# Patient Record
Sex: Female | Born: 1965 | Race: White | Hispanic: Refuse to answer | Marital: Married | Smoking: Never smoker
Health system: Southern US, Community
[De-identification: ages and names within clinical notes are randomized; demographics above are authoritative.]

## PROBLEM LIST (undated history)

## (undated) DIAGNOSIS — F419 Anxiety disorder, unspecified: Secondary | ICD-10-CM

## (undated) DIAGNOSIS — R9431 Abnormal electrocardiogram [ECG] [EKG]: Secondary | ICD-10-CM

## (undated) DIAGNOSIS — R053 Chronic cough: Secondary | ICD-10-CM

## (undated) DIAGNOSIS — L719 Rosacea, unspecified: Secondary | ICD-10-CM

## (undated) DIAGNOSIS — R112 Nausea with vomiting, unspecified: Secondary | ICD-10-CM

## (undated) DIAGNOSIS — S060XAA Concussion with loss of consciousness status unknown, initial encounter: Secondary | ICD-10-CM

## (undated) DIAGNOSIS — Z87442 Personal history of urinary calculi: Secondary | ICD-10-CM

## (undated) DIAGNOSIS — R519 Headache, unspecified: Secondary | ICD-10-CM

## (undated) DIAGNOSIS — S060X9A Concussion with loss of consciousness of unspecified duration, initial encounter: Secondary | ICD-10-CM

## (undated) DIAGNOSIS — G8929 Other chronic pain: Secondary | ICD-10-CM

## (undated) DIAGNOSIS — R51 Headache: Secondary | ICD-10-CM

## (undated) DIAGNOSIS — J45990 Exercise induced bronchospasm: Secondary | ICD-10-CM

## (undated) DIAGNOSIS — R911 Solitary pulmonary nodule: Secondary | ICD-10-CM

## (undated) DIAGNOSIS — Z8669 Personal history of other diseases of the nervous system and sense organs: Secondary | ICD-10-CM

## (undated) DIAGNOSIS — S0990XA Unspecified injury of head, initial encounter: Secondary | ICD-10-CM

## (undated) DIAGNOSIS — R0600 Dyspnea, unspecified: Secondary | ICD-10-CM

## (undated) DIAGNOSIS — R9389 Abnormal findings on diagnostic imaging of other specified body structures: Secondary | ICD-10-CM

## (undated) DIAGNOSIS — Z9889 Other specified postprocedural states: Secondary | ICD-10-CM

## (undated) DIAGNOSIS — E039 Hypothyroidism, unspecified: Secondary | ICD-10-CM

## (undated) DIAGNOSIS — R05 Cough: Secondary | ICD-10-CM

## (undated) HISTORY — DX: Headache: R51

## (undated) HISTORY — DX: Solitary pulmonary nodule: R91.1

## (undated) HISTORY — DX: Nausea with vomiting, unspecified: R11.2

## (undated) HISTORY — DX: Personal history of other diseases of the nervous system and sense organs: Z86.69

## (undated) HISTORY — DX: Abnormal electrocardiogram (ECG) (EKG): R94.31

## (undated) HISTORY — DX: Concussion with loss of consciousness of unspecified duration, initial encounter: S06.0X9A

## (undated) HISTORY — DX: Headache, unspecified: R51.9

## (undated) HISTORY — DX: Unspecified injury of head, initial encounter: S09.90XA

## (undated) HISTORY — DX: Personal history of urinary calculi: Z87.442

## (undated) HISTORY — DX: Concussion with loss of consciousness status unknown, initial encounter: S06.0XAA

## (undated) HISTORY — DX: Other specified postprocedural states: Z98.890

## (undated) HISTORY — DX: Chronic cough: R05.3

## (undated) HISTORY — DX: Other chronic pain: G89.29

## (undated) HISTORY — DX: Anxiety disorder, unspecified: F41.9

## (undated) HISTORY — PX: ELBOW SURGERY: SHX618

## (undated) HISTORY — DX: Dyspnea, unspecified: R06.00

## (undated) HISTORY — DX: Abnormal findings on diagnostic imaging of other specified body structures: R93.89

## (undated) HISTORY — PX: EYE SURGERY: SHX253

## (undated) HISTORY — DX: Rosacea, unspecified: L71.9

## (undated) HISTORY — DX: Cough: R05

## (undated) HISTORY — PX: SHOULDER SURGERY: SHX246

## (undated) HISTORY — DX: Exercise induced bronchospasm: J45.990

---

## 1989-11-11 HISTORY — PX: FOOT SURGERY: SHX648

## 1999-09-11 ENCOUNTER — Other Ambulatory Visit: Admission: RE | Admit: 1999-09-11 | Discharge: 1999-09-11 | Payer: Self-pay | Admitting: Obstetrics and Gynecology

## 2000-09-29 ENCOUNTER — Other Ambulatory Visit: Admission: RE | Admit: 2000-09-29 | Discharge: 2000-09-29 | Payer: Self-pay | Admitting: Obstetrics and Gynecology

## 2001-11-24 ENCOUNTER — Other Ambulatory Visit: Admission: RE | Admit: 2001-11-24 | Discharge: 2001-11-24 | Payer: Self-pay | Admitting: Obstetrics and Gynecology

## 2002-01-18 ENCOUNTER — Encounter: Payer: Self-pay | Admitting: Obstetrics and Gynecology

## 2002-01-18 ENCOUNTER — Encounter: Admission: RE | Admit: 2002-01-18 | Discharge: 2002-01-18 | Payer: Self-pay | Admitting: Obstetrics and Gynecology

## 2002-12-20 ENCOUNTER — Other Ambulatory Visit: Admission: RE | Admit: 2002-12-20 | Discharge: 2002-12-20 | Payer: Self-pay | Admitting: Obstetrics and Gynecology

## 2004-02-13 ENCOUNTER — Other Ambulatory Visit: Admission: RE | Admit: 2004-02-13 | Discharge: 2004-02-13 | Payer: Self-pay | Admitting: Obstetrics and Gynecology

## 2005-03-01 ENCOUNTER — Other Ambulatory Visit: Admission: RE | Admit: 2005-03-01 | Discharge: 2005-03-01 | Payer: Self-pay | Admitting: Obstetrics and Gynecology

## 2009-03-21 ENCOUNTER — Encounter (INDEPENDENT_AMBULATORY_CARE_PROVIDER_SITE_OTHER): Payer: Self-pay | Admitting: Internal Medicine

## 2009-03-21 ENCOUNTER — Ambulatory Visit: Payer: Self-pay

## 2009-03-24 ENCOUNTER — Encounter: Payer: Self-pay | Admitting: Physician Assistant

## 2009-03-24 ENCOUNTER — Ambulatory Visit: Payer: Self-pay | Admitting: Cardiology

## 2009-03-28 ENCOUNTER — Ambulatory Visit: Payer: Self-pay

## 2009-03-28 ENCOUNTER — Encounter: Payer: Self-pay | Admitting: Cardiology

## 2009-03-30 ENCOUNTER — Telehealth: Payer: Self-pay | Admitting: Cardiology

## 2009-05-10 DIAGNOSIS — R0600 Dyspnea, unspecified: Secondary | ICD-10-CM | POA: Insufficient documentation

## 2009-05-10 DIAGNOSIS — R9431 Abnormal electrocardiogram [ECG] [EKG]: Secondary | ICD-10-CM

## 2009-05-10 DIAGNOSIS — R0602 Shortness of breath: Secondary | ICD-10-CM | POA: Insufficient documentation

## 2009-05-10 HISTORY — DX: Abnormal electrocardiogram (ECG) (EKG): R94.31

## 2009-06-02 ENCOUNTER — Ambulatory Visit: Payer: Self-pay | Admitting: Genetic Counselor

## 2009-07-26 ENCOUNTER — Ambulatory Visit: Payer: Self-pay | Admitting: Genetic Counselor

## 2009-11-11 DIAGNOSIS — R911 Solitary pulmonary nodule: Secondary | ICD-10-CM

## 2009-11-11 HISTORY — DX: Solitary pulmonary nodule: R91.1

## 2009-12-14 ENCOUNTER — Emergency Department (HOSPITAL_COMMUNITY): Admission: EM | Admit: 2009-12-14 | Discharge: 2009-12-14 | Payer: Self-pay | Admitting: Emergency Medicine

## 2009-12-18 ENCOUNTER — Encounter: Payer: Self-pay | Admitting: Internal Medicine

## 2009-12-18 ENCOUNTER — Telehealth: Payer: Self-pay | Admitting: Internal Medicine

## 2009-12-25 ENCOUNTER — Ambulatory Visit: Payer: Self-pay | Admitting: Internal Medicine

## 2009-12-25 DIAGNOSIS — R1013 Epigastric pain: Secondary | ICD-10-CM | POA: Insufficient documentation

## 2009-12-26 ENCOUNTER — Ambulatory Visit: Payer: Self-pay | Admitting: Internal Medicine

## 2009-12-28 ENCOUNTER — Telehealth: Payer: Self-pay | Admitting: Internal Medicine

## 2009-12-28 ENCOUNTER — Ambulatory Visit (HOSPITAL_COMMUNITY): Admission: RE | Admit: 2009-12-28 | Discharge: 2009-12-28 | Payer: Self-pay | Admitting: Internal Medicine

## 2010-03-12 ENCOUNTER — Encounter: Payer: Self-pay | Admitting: Internal Medicine

## 2010-06-14 ENCOUNTER — Encounter: Admission: RE | Admit: 2010-06-14 | Discharge: 2010-06-14 | Payer: Self-pay | Admitting: Internal Medicine

## 2010-06-14 ENCOUNTER — Encounter: Payer: Self-pay | Admitting: Internal Medicine

## 2010-06-27 ENCOUNTER — Ambulatory Visit: Payer: Self-pay | Admitting: Internal Medicine

## 2010-06-27 DIAGNOSIS — J984 Other disorders of lung: Secondary | ICD-10-CM | POA: Insufficient documentation

## 2010-06-27 DIAGNOSIS — R918 Other nonspecific abnormal finding of lung field: Secondary | ICD-10-CM | POA: Insufficient documentation

## 2010-06-27 LAB — CONVERTED CEMR LAB: Sed Rate: 10 mm/hr (ref 0–22)

## 2010-12-11 NOTE — Progress Notes (Signed)
Summary: triage  Phone Note From Other Clinic Call back at [phone]   Caller: Lelon Mast Call For: Dr. Marina Goodell Reason for Call: Schedule Patient Appt Summary of Call: sch'ed next available with Dr. Marina Goodell, specific request, but would like sooner appt... pt has abd and esophagial pain that is making it difficult for pt to breathe and walk Initial call taken by: Vallarie Mare,  December 18, 2009 2:09 PM  Follow-up for Phone Call        Samantha ntfd. that pt's appt. has been moved up to 12/25/2009.She will contact pt.New pt. info mailed to pt. Follow-up by: Teryl Lucy RN,  December 18, 2009 3:41 PM

## 2010-12-11 NOTE — Assessment & Plan Note (Signed)
Summary: abd and esophagial pain...as.   History of Present Illness Visit Type: we'll one and a Primary GI MD: Yancey Flemings MD Primary Provider: Marisue Brooklyn, DO Requesting Provider: Marisue Brooklyn, DO Chief Complaint: Patient complains of upper abdominal pain that started on 2-1. The pain resolved some since she was started on cipro, she took her last dose this morning. She is still on a limited diet at this point.  History of Present Illness:   45 year old female with chronic migraine headaches. She presents today for evaluation of new onset abdominal pain. She is accompanied by her husband. Patient reports being in her usual state of good health until December 12, 2009 when she began to notice epigastric discomfort below the breastbone. The discomfort waxed and waned over the next 2 days. She self medicated with Nexium. She did notice that meals seemed to help somewhat. The pain intensified she was evaluated in her physician's office on February 3. The patient had a migraine headache and was treated with Toradol. There was a concern over an abdominal aortic aneurysm for which she was sent to the emergency room. Evaluation in the emergency room consisted of multiple laboratories. CBC revealed a mildly elevated white blood cell count of 12.7 with a left shift. Urinalysis was negative. Normal liver function tests and serum lipase. Acute abdominal series with chest film revealed slightly increased markings in the right middle lobe and question pectus deformity. CT Scan of the abdomen and pelvis with contrast revealed no acute process. Pectus deformity noted. Slight prominence of the pancreatic head without mass commented on (reviewed). She was treated with Nexium and Pepcid. She followed up in her physician's office and was treated with Cipro and modified diet. She has just completed one 7 day course of Cipro. Her symptoms are gradually improving though not gone. There was no associated nausea, vomiting,  diarrhea, fevers, or radiation into the back. There was discomfort under the rib cage bilaterally. She rarely uses NSAIDs. She does have a history of kidney stones. She asked about advancing her diet.   GI Review of Systems    Reports abdominal pain, belching, loss of appetite, and  weight loss.     Location of  Abdominal pain: upper abdomen. Weight loss of 10 pounds over 13 days.   Denies acid reflux, bloating, chest pain, dysphagia with liquids, dysphagia with solids, heartburn, nausea, vomiting, vomiting blood, and  weight gain.        Denies anal fissure, black tarry stools, change in bowel habit, constipation, diarrhea, diverticulosis, fecal incontinence, heme positive stool, hemorrhoids, irritable bowel syndrome, jaundice, light color stool, liver problems, rectal bleeding, and  rectal pain.    Current Medications (verified): 1)  Pepcid 40 Mg Tabs (Famotidine) .... Take One By Mouth Once Daily 2)  Nexium 40 Mg Cpdr (Esomeprazole Magnesium) .Marland Kitchen.. 1 By Mouth Once Daily 3)  Imitrex 100 Mg Tabs (Sumatriptan Succinate) .... Take As Needed 4)  Imitrex 20 Mg/act Soln (Sumatriptan) .... As Needed 5)  Zyrtec Allergy 10 Mg Tabs (Cetirizine Hcl) .... Take One By Mouth Once Daily 6)  Aspirin 81 Mg Tbec (Aspirin) .... Take One By Mouth Once Daily 7)  Vitamin D 1000 Unit Tabs (Cholecalciferol) .... Take Three Tabs By Mouth Once Daily 8)  Calcium Carbonate-Vitamin D 600-200 Mg-Unit Tabs (Calcium Carbonate-Vitamin D) .... Take One By Mouth Once Daily 9)  Flax Seed Oil 1000 Mg Caps (Flaxseed (Linseed)) .... Take One By Mouth Once Daily 10)  Co-Enzyme Q-10 100 Mg Caps (Coenzyme Q10) .Marland KitchenMarland KitchenMarland Kitchen  Take One By Mouth Once Daily 11)  Low-Ogestrel 0.3-30 Mg-Mcg Tabs (Norgestrel-Ethinyl Estradiol) .... Take One By Mouth Once Daily 12)  Valtrex 500 Mg Tabs (Valacyclovir Hcl) .... As Needed 13)  Vesicare 5 Mg Tabs (Solifenacin Succinate) .... As Needed  Allergies (verified): 1)  ! Codeine  Past History:  Past  Medical History: Reviewed history from 12/22/2009 and no changes required. DYSPNEA (ICD-786.05) ABNORMAL EKG (ICD-794.31)   Chronic Headaches  Past Surgical History: Shoulder bilateral acromium bone spur  Foot left  Family History: Family History of Breast Cancer: maternal grandmother, maternal great grandmother Family History of Ovarian Cancer:paternal great grandmother, maternal great aunt Family History of Colon Polyps: mother, father Family History of Colitis: father Family History of Diabetes: Paternal grandfather, mother Family History of Heart Disease: father  Social History: Full Time - city of [place] Married  Tobacco Use - No.  Regular Exercise - yes Drug Use - no Daily Caffeine Use 2 per day  Review of Systems       The patient complains of allergy/sinus and headaches-new.  The patient denies anemia, anxiety-new, arthritis/joint pain, back pain, blood in urine, breast changes/lumps, change in vision, confusion, cough, coughing up blood, depression-new, fainting, fatigue, fever, hearing problems, heart murmur, heart rhythm changes, itching, menstrual pain, muscle pains/cramps, night sweats, nosebleeds, pregnancy symptoms, shortness of breath, skin rash, sleeping problems, sore throat, swelling of feet/legs, swollen lymph glands, thirst - excessive , urination - excessive , urination changes/pain, urine leakage, vision changes, and voice change.    Vital Signs:  Patient profile:   45 year old female Height:      69 inches Weight:      161.6 pounds BMI:     23.95 Pulse rate:   64 / minute Pulse rhythm:   regular BP sitting:   112 / 66  (left arm) Cuff size:   regular  Vitals Entered By: Harlow Mares CMA Duncan Dull) (December 25, 2009 4:00 PM)  Physical Exam  General:  Well developed, well nourished, no acute distress. Head:  Normocephalic and atraumatic. Eyes:  PERRLA, no icterus. Ears:  Normal auditory acuity. Nose:  No deformity, discharge,  or  lesions. Mouth:  No deformity or lesions, dentition normal. Neck:  Supple; no masses or thyromegaly. Lungs:  Clear throughout to auscultation. Heart:  Regular rate and rhythm; no murmurs, rubs,  or bruits. Abdomen:  Soft,  and nondistended. Mild epigastric discomfort to palpation. No masses, hepatosplenomegaly or hernias noted. Normal bowel sounds. Msk:  Symmetrical with no gross deformities. Normal posture. Pulses:  Normal pulses noted. Extremities:  No clubbing, cyanosis, edema or deformities noted. Neurologic:  Alert and  oriented x4;  grossly normal neurologically. Skin:  Intact without significant lesions or rashes. Psych:  Alert and cooperative. Normal mood and affect.   Impression & Recommendations:  Problem # 1:  EPIGASTRIC PAIN (ICD-789.06) Acute epigastric pain, onset 2 weeks ago. Gradually improving. Associated with mild leukocytosis. Etiology unclear. Possible causes include duodenal ulcer, biliary colic, or nonspecific infectious process.  Plan: #1. Abdominal ultrasound rule out gallstones #2. Upper endoscopy rule out ulcer disease #3. Continue Nexium but discontinue Pepcid  Problem # 2:  NONSPECIFIC ABN FINDING RAD & OTH EXAM GI TRACT (ICD-793.4) questionable prominence of the pancreatic head without mass. Nonspecific. Normal pancreatic and liver enzymes. Do not relate this to acute pain process. Would not pursue further at this time.  Other Orders: EGD (EGD) Ultrasound Abdomen (UAS)  Patient Instructions: 1)  Abdominal Ultrasound [facility] 12/28/09 9:00 am 2)  Nothing to eat or drink 6 hours prior  3)  EGD [facility] 12/26/09 9:30 am 4)  Upper Endoscopy brochure given.  5)  The medication list was reviewed and reconciled.  All changed / newly prescribed medications were explained.  A complete medication list was provided to the patient / caregiver. 6)  Copy: Dr. Marisue Brooklyn D.O.

## 2010-12-11 NOTE — Procedures (Signed)
Summary: Upper Endoscopy  Patient: [name] Note: All result statuses are Final unless otherwise noted.  Tests: (1) Upper Endoscopy (EGD)   EGD Upper Endoscopy       DONE     [facility]     [address]     [place], [place]  [address]           ENDOSCOPY PROCEDURE REPORT           PATIENT:  Nicole Waller, Nicole Waller  MR#:  [id number]     BIRTHDATE:  1966/02/25, 43 yrs. old  GENDER:  female           ENDOSCOPIST:  Wilhemina Bonito. Eda Keys, MD     Referred by:  Marisue Brooklyn, D.O.           PROCEDURE DATE:  12/26/2009     PROCEDURE:  EGD, diagnostic     ASA CLASS:  Class II     INDICATIONS:  epigastric pain           MEDICATIONS:   Fentanyl 100 mcg IV, Versed 8 mg IV     TOPICAL ANESTHETIC:  Exactacain Spray           DESCRIPTION OF PROCEDURE:   After the risks benefits and     alternatives of the procedure were thoroughly explained, informed     consent was obtained.  The [facility] [id number] endoscope was     introduced through the mouth and advanced to the second portion of     the duodenum, without limitations.  The instrument was slowly     withdrawn as the mucosa was fully examined.     <<PROCEDUREIMAGES>>           The upper, middle, and distal third of the esophagus were     carefully inspected and no abnormalities were noted. The z-line     was well seen at the GEJ. The endoscope was pushed into the fundus     which was normal including a retroflexed view. The antrum,gastric     body, first and second part of the duodenum were unremarkable.     Retroflexed views revealed no abnormalities.    The scope was then     withdrawn from the patient and the procedure completed.           COMPLICATIONS:  None           ENDOSCOPIC IMPRESSION:     1) Normal EGD           RECOMMENDATIONS:     1) Keep Abdominal Ultrasound appointment           ______________________________     Wilhemina Bonito. Eda Keys, MD           CC:  Marisue Brooklyn, DO, The Patient           n.     eSIGNED:    Wilhemina Bonito. Eda Keys at 12/26/2009 11:00 AM           Scarlette Ar, [id number]  Note: An exclamation mark (!) indicates a result that was not dispersed into the flowsheet. Document Creation Date: 12/26/2009 11:01 AM _______________________________________________________________________  (1) Order result status: Final Collection or observation date-time: 12/26/2009 10:54 Requested date-time:  Receipt date-time:  Reported date-time:  Referring Physician:   Ordering Physician: Fransico Setters [phone]) Specimen Source:  Source: Launa Grill Order Number: [id number] Lab site:

## 2010-12-11 NOTE — Letter (Signed)
Summary: EGD Instructions  [facility]  [address] [place], [place] [address]   Phone: [phone]  Fax: [phone]       Nicole Waller    September 22, 1966    MRN: [id number]       Procedure Day /Date:TUESDAY, 12/26/09     Arrival Time: 8:30 AM     Procedure Time:9:30 AM     Location of Procedure:                    [facility] (4th Floor)    PREPARATION FOR ENDOSCOPY   On TUESDAYTHE DAY OF THE PROCEDURE:  1.   No solid foods, milk or milk products are allowed after midnight the night before your procedure.  2.   Do not drink anything colored red or purple.  Avoid juices with pulp.  No orange juice.  3.  You may drink clear liquids until7:30 AM which is 2 hours before your procedure.                                                                                                CLEAR LIQUIDS INCLUDE: Water Jello Ice Popsicles Tea (sugar ok, no milk/cream) Powdered fruit flavored drinks Coffee (sugar ok, no milk/cream) Gatorade Juice: apple, white grape, white cranberry  Lemonade Clear bullion, consomm, broth Carbonated beverages (any kind) Strained chicken noodle soup Hard Candy   MEDICATION INSTRUCTIONS  Unless otherwise instructed, you should take regular prescription medications with a small sip of water as early as possible the morning of your procedure.               OTHER INSTRUCTIONS  You will need a responsible adult at least 45 years of age to accompany you and drive you home.   This person must remain in the waiting room during your procedure.  Wear loose fitting clothing that is easily removed.  Leave jewelry and other valuables at home.  However, you may wish to bring a book to read or an iPod/MP3 player to listen to music as you wait for your procedure to start.  Remove all body piercing jewelry and leave at home.  Total time from sign-in until discharge is approximately 2-3 hours.  You should go home  directly after your procedure and rest.  You can resume normal activities the day after your procedure.  The day of your procedure you should not:   Drive   Make legal decisions   Operate machinery   Drink alcohol   Return to work  You will receive specific instructions about eating, activities and medications before you leave.    The above instructions have been reviewed and explained to me by   _______________________    I fully understand and can verbalize these instructions _____________________________ Date _________

## 2010-12-11 NOTE — Progress Notes (Signed)
Summary: [place] results  Phone Note Call from Patient Call back at [phone]   Caller: Patient Call For: Dr. Marina Goodell Reason for Call: Lab or Test Results Summary of Call: pt would like [place] results Initial call taken by: Vallarie Mare,  December 28, 2009 3:44 PM  Follow-up for Phone Call        Pt notified.  See append to [place]. Follow-up by: Ashok Cordia RN,  December 28, 2009 4:47 PM

## 2010-12-11 NOTE — Letter (Signed)
Summary: Appt Reminder 2  [facility]  [address] [place], [place] [address]   Phone: [phone]  Fax: [phone]        December 18, 2009 MRN: [id number]    [facility] [address] [place], [place]  [address]    Dear Ms. Danielsen,   You have a new patient appointment with Dr. Yancey Flemings on Monday-February 14,2011 at 3:45 p.m  Please remember to bring a complete list of the medicines you are taking, your insurance card and your co-pay.  If you have to cancel or reschedule this appointment, please call before 5:00 pm the evening before to avoid a cancellation fee.  If you have any questions or concerns, please call [phone].    Sincerely,    Teryl Lucy RN

## 2010-12-11 NOTE — Assessment & Plan Note (Signed)
Summary: Pulmonary consultation/ SPN   Visit Type:  Initial Consult Copy to:  Marisue Brooklyn, DO Primary Provider/Referring Provider:  Marisue Brooklyn, DO  CC:  Pulmonary consult.  Abnormal CT chest..  History of Present Illness: 45 yowf never smoker grew up in house of smoking last heavy exp to passive smoke in 1980s  June 27, 2010  1st pulmonary office eval p ct abd 12/14/09 for nonpleuritic cp/abd pain with spn then full ct chest 06/14/10 with MPNs but no more cp,  chronicically with  some aches and pains but no overt rheumatism or travel exp.  Pt denies any significant sore throat, dysphagia, itching, sneezing,  nasal congestion or excess secretions,  fever, chills, sweats, unintended wt loss, pleuritic or exertional cp, hempoptysis, change in activity tolerance  orthopnea pnd or leg swelling    Preventive Screening-Counseling & Management  Alcohol-Tobacco     Alcohol drinks/day: <1     Smoking Status: never  Current Medications (verified): 1)  Imitrex 100 Mg Tabs (Sumatriptan Succinate) .... Take As Needed 2)  Imitrex 20 Mg/act Soln (Sumatriptan) .... As Needed 3)  Zyrtec Allergy 10 Mg Tabs (Cetirizine Hcl) .... Take One By Mouth Once Daily 4)  Aspirin 81 Mg Tbec (Aspirin) .... Take One By Mouth Once Daily 5)  Vitamin D 1000 Unit Tabs (Cholecalciferol) .... Take Three Tabs By Mouth Once Daily 6)  Calcium Carbonate-Vitamin D 600-200 Mg-Unit Tabs (Calcium Carbonate-Vitamin D) .... Take One By Mouth Once Daily 7)  Flax Seed Oil 1000 Mg Caps (Flaxseed (Linseed)) .... Take One By Mouth Once Daily 8)  Co-Enzyme Q-10 100 Mg Caps (Coenzyme Q10) .... Take One By Mouth Once Daily 9)  Low-Ogestrel 0.3-30 Mg-Mcg Tabs (Norgestrel-Ethinyl Estradiol) .... Take One By Mouth Once Daily 10)  Valtrex 500 Mg Tabs (Valacyclovir Hcl) .... As Needed 11)  Vesicare 5 Mg Tabs (Solifenacin Succinate) .... As Needed 12)  Ambien 10 Mg Tabs (Zolpidem Tartrate) .Marland Kitchen.. 1 By Mouth At Bedtime As Needed 13)   Multivitamins  Tabs (Multiple Vitamin) .Marland Kitchen.. 1 By Mouth Daily 14)  Xanax 0.5 Mg Tabs (Alprazolam) .... 1/4 By Mouth As Needed  Allergies (verified): 1)  ! Codeine  Past History:  Past Medical History: DYSPNEA (ICD-786.05) ABNORMAL EKG (ICD-794.31) ABN CHEST CT     - orignially detected 12/14/09     - F/u CT chest 06/14/10 SPN bilaterally one 11 mm LLL but not change since original ct. ABD Pain      - EGD neg 12/26/09      - Abd u/s neg 12/28/09   Chronic Headaches  Family History: Family History of Breast Cancer: maternal grandmother, maternal great grandmother Family History of Ovarian Cancer:paternal great grandmother, maternal great aunt Family History of Colon Polyps: mother, father Family History of Colitis: father Family History of Diabetes: Paternal grandfather, mother Family History of Heart Disease: father Pulmonary fibrosis / RA   mother  Social History: Full Time - city of [place] Married  Tobacco Use - No.  Regular Exercise - yes Drug Use - no Daily Caffeine Use 2 per day Patient never smoked Sister works for [employer] Alcohol drinks/day:  <1  Review of Systems       The patient complains of headaches.  The patient denies shortness of breath with activity, shortness of breath at rest, productive cough, non-productive cough, coughing up blood, chest pain, irregular heartbeats, acid heartburn, indigestion, loss of appetite, weight change, abdominal pain, difficulty swallowing, sore throat, tooth/dental problems, nasal congestion/difficulty breathing through nose, sneezing, itching,  ear ache, anxiety, depression, hand/feet swelling, joint stiffness or pain, rash, change in color of mucus, and fever.    Vital Signs:  Patient profile:   45 year old female Height:      69 inches (175.26 cm) Weight:      159.38 pounds (72.45 kg) BMI:     23.62 O2 Sat:      99 % on Room air Temp:     99.1 degrees F (37.28 degrees C) oral Pulse rate:   67 / minute BP sitting:    110 / 84  (left arm) Cuff size:   regular  Vitals Entered By: Michel Bickers CMA (June 27, 2010 10:38 AM)  O2 Sat at Rest %:  99 O2 Flow:  Room air CC: Pulmonary consult.  Abnormal CT chest. Comments Medications reviewed with the patient. Daytime phone verified. Michel Bickers [facility]  June 27, 2010 10:39 AM   Physical Exam  Additional Exam:  amb wf nad wt 161 > 159 June 27, 2010  HEENT: nl dentition, turbinates, and orophanx. Nl external ear canals without cough reflex NECK :  without JVD/Nodes/TM/ nl carotid upstrokes bilaterally LUNGS: no acc muscle use, clear to A and P bilaterally without cough on insp or exp maneuvers CV:  RRR  no s3 or murmur or increase in P2, no edema  ABD:  soft and nontender with nl excursion in the supine position. 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  ddx = granulomas, non calcified, MAI or related to collagen vasc dz.  Bx very very low yield in setting of ESR 10  so follow with cxr in 6 months  Discussed in detail all the  indications, usual  risks and alternatives  relative to the benefits with patient  and sister who agrees to proceed with conservative f/u.     Medications Added to Medication List This Visit: 1)  Ambien 10 Mg Tabs (Zolpidem tartrate) .Marland Kitchen.. 1 by mouth at bedtime as needed 2)  Multivitamins Tabs (Multiple vitamin) .Marland Kitchen.. 1 by mouth daily 3)  Xanax 0.5 Mg Tabs (Alprazolam) .... 1/4 by mouth as needed  Other Orders: Consultation Level V (99245) TLB-Sedimentation Rate (ESR) (85652-ESR)  Patient Instructions: 1)  we will call you with esr and refer you to Deveshwar if elevated 2)  F/u  cxr  at 6 months in the absence of high esr or pulmonary symptoms

## 2010-12-19 ENCOUNTER — Other Ambulatory Visit: Payer: Self-pay | Admitting: Internal Medicine

## 2010-12-19 ENCOUNTER — Ambulatory Visit (INDEPENDENT_AMBULATORY_CARE_PROVIDER_SITE_OTHER)
Admission: RE | Admit: 2010-12-19 | Discharge: 2010-12-19 | Disposition: A | Discharge status: Home / Self Care | Payer: 59 | Source: Ambulatory Visit | Attending: Internal Medicine | Admitting: Internal Medicine

## 2010-12-19 ENCOUNTER — Encounter: Payer: Self-pay | Admitting: Internal Medicine

## 2010-12-19 ENCOUNTER — Ambulatory Visit (INDEPENDENT_AMBULATORY_CARE_PROVIDER_SITE_OTHER): Payer: 59 | Admitting: Internal Medicine

## 2010-12-19 DIAGNOSIS — J45991 Cough variant asthma: Secondary | ICD-10-CM | POA: Insufficient documentation

## 2010-12-19 DIAGNOSIS — R05 Cough: Secondary | ICD-10-CM

## 2010-12-19 DIAGNOSIS — R059 Cough, unspecified: Secondary | ICD-10-CM

## 2010-12-19 DIAGNOSIS — J984 Other disorders of lung: Secondary | ICD-10-CM

## 2010-12-26 ENCOUNTER — Telehealth (INDEPENDENT_AMBULATORY_CARE_PROVIDER_SITE_OTHER): Payer: Self-pay | Admitting: *Deleted

## 2010-12-27 NOTE — Assessment & Plan Note (Signed)
Summary: Pulmonary/ ext ov for cough/ spn   Copy to:  Marisue Brooklyn, DO Primary Provider/Referring Provider:  Marisue Brooklyn, DO  CC:  Pulmonary nodule and cough.  History of Present Illness: 45 yowf never smoker grew up in house of smoking last heavy exp to passive smoke in 1980s  June 27, 2010  1st pulmonary office eval p ct abd 12/14/09 for nonpleuritic cp/abd pain with spn then full ct chest 06/14/10 with MPNs but no more cp,  chronicically with  some aches and pains but no overt rheumatism or travel exp.    December 19, 2010 ov c/o cough since early Jan 2012  productive of green sputum turned white after levaquin and zpak.  tendency to recurrent cough x two years, getting more freq and severe and lingering into daily coughing now since early Jan 45.  Pt denies any significant sore throat, dysphagia, itching, sneezing,  nasal congestion or excess secretions,  fever, chills, sweats, unintended wt loss, pleuritic or exertional cp, hempoptysis, change in activity tolerance  orthopnea pnd or leg swelling Pt also denies any obvious fluctuation in symptoms with weather or environmental change or other alleviating or aggravating factors.     Has cats in bedroom x 11 years  Current Medications (verified): 1)  Imitrex 100 Mg Tabs (Sumatriptan Succinate) .... Take As Needed 2)  Imitrex 20 Mg/act Soln (Sumatriptan) .... As Needed 3)  Zyrtec Allergy 10 Mg Tabs (Cetirizine Hcl) .... Take One By Mouth Once Daily 4)  Aspirin 81 Mg Tbec (Aspirin) .... Take One By Mouth Once Daily 5)  Vitamin D 1000 Unit Tabs (Cholecalciferol) .... Take Three Tabs By Mouth Once Daily 6)  Calcium Carbonate-Vitamin D 600-200 Mg-Unit Tabs (Calcium Carbonate-Vitamin D) .... Take One By Mouth Once Daily 7)  Flax Seed Oil 1000 Mg Caps (Flaxseed (Linseed)) .... Take One By Mouth Once Daily 8)  Co-Enzyme Q-10 100 Mg Caps (Coenzyme Q10) .... Take One By Mouth Once Daily 9)  Low-Ogestrel 0.3-30 Mg-Mcg Tabs (Norgestrel-Ethinyl  Estradiol) .... Take One By Mouth Once Daily 10)  Valtrex 500 Mg Tabs (Valacyclovir Hcl) .... As Needed 11)  Vesicare 5 Mg Tabs (Solifenacin Succinate) .... As Needed 12)  Ambien 10 Mg Tabs (Zolpidem Tartrate) .Marland Kitchen.. 1 By Mouth At Bedtime As Needed 13)  Multivitamins  Tabs (Multiple Vitamin) .Marland Kitchen.. 1 By Mouth Daily 14)  Xanax 0.5 Mg Tabs (Alprazolam) .... 1/4 By Mouth As Needed  Allergies (verified): 1)  ! Codeine  Past History:  Past Medical History: DYSPNEA (ICD-786.05) ABNORMAL EKG (ICD-794.31) ABN CHEST CT     - orignially detected 12/14/09     - F/u CT chest 06/14/10 SPN bilaterally one 11 mm LLL but not change since original ct. ABD Pain      - EGD neg 12/26/09      - Abd u/s neg 12/28/09  Health Maintenance       - Pneumovax 2000  Chronic Headaches Chronic cough/ recurrent bronchitis since 2010       - Max GERD trial December 19, 2010   Vital Signs:  Patient profile:   45 year old female Weight:      168.50 pounds O2 Sat:      97 % on Room air Temp:     98.2 degrees F oral Pulse rate:   66 / minute BP sitting:   130 / 80  (left arm)  Vitals Entered By: Vernie Murders (December 19, 2010 4:33 PM)  O2 Flow:  Room air  Physical Exam  Additional Exam:  amb wf nad wt 161 > 159 June 27, 2010 > 168 December 19, 2010  HEENT: nl dentition, turbinates, and orophanx. Nl external ear canals without cough reflex NECK :  without JVD/Nodes/TM/ nl carotid upstrokes bilaterally LUNGS: no acc muscle use, clear to A and P bilaterally without cough on insp or exp maneuvers CV:  RRR  no s3 or murmur or increase in P2, no edema  ABD:  soft and nontender with nl excursion in the supine position. No bruits or organomegaly, bowel sounds nl MS:  warm without deformities, calf tenderness, cyanosis or clubbing SKIN: warm and dry without lesions         CXR  Procedure date:  12/19/2010  Findings:         Comparison: Chest CT 06/14/2010 and chest x-ray 08/02 01/2010   Findings: The  cardiac silhouette, mediastinal and hilar contours are within normal limits.  Very prominent infrahilar vessels on the right side due to a pectus deformity.  The small left lower lobe pulmonary nodules seen on the prior chest x-ray is not identified on the radiographs.  No acute pulmonary findings.  The bony thorax is intact.   IMPRESSION: No acute cardiopulmonary findings.  Impression & Recommendations:  Problem # 1:  PULMONARY NODULE (ICD-518.89)  MPN bilateral with the  only one > 8 mm  in the LLL and this is unchanged since 01/2010.  ddx = granulomas, non calcified, MAI or related to collagen vasc dz.  Bx very very low yield in setting of ESR nl  so follow with cxr in 6 months  Discussed in detail all the  indications, usual  risks and alternatives  relative to the benefits with patient  and husband  who agree  to proceed with conservative f/u.     Orders: Est. Patient Level IV (21308)  Problem # 2:  COUGH (ICD-786.2)  The most common causes of chronic cough in immunocompetent adults include: upper airway cough syndrome (UACS), previously referred to as postnasal drip syndrome,  caused by variety of rhinosinus conditions; (2) asthma; (3) GERD; (4) chronic bronchitis from cigarette smoking or other inhaled environmental irritants; (5) nonasthmatic eosinophilic bronchitis; and (6) bronchiectasis. These conditions, singly or in combination, have accounted for up to 94% of the causes of chronic cough in prospective studies.  this is most c/w  Classic Upper airway cough syndrome, so named because it's frequently impossible to sort out how much is  CR/sinusitis with freq throat clearing (which can be related to primary GERD)   vs  causing  secondary extra esophageal GERD from wide swings in gastric pressure that occur with throat clearing, promoting self use of mint and menthol lozenges that reduce the lower esophageal sphincter tone and exacerbate the problem further These are the same pts  who not infrequently have failed to tolerate ace inhibitors,  dry powder inhalers or biphosphonates or report having reflux symptoms that don't respond to standard doses of PPI  The standardized cough guidelines recently published in Chest are a 14 step process, not a single office visit,  and are intended  to address this problem logically,  with an alogrithm dependent on response to each progressive step  to determine a specific diagnosis with  minimal addtional testing needed. Therefore if compliance is an issue this empiric standardized approach simply won't work.  Try max gerd rx then return for sinus ct/ allergy eval next  Orders: Est. Patient Level IV (65784)  Medications Added  to Medication List This Visit: 1)  Nexium 40 Mg Cpdr (Esomeprazole magnesium) .... By mouth daily. take one half hour before eating. 2)  Pepcid 20 Mg Tabs (Famotidine) .... Take one by mouth at bedtime 3)  Prednisone 10 Mg Tabs (Prednisone) .... 4 each am x 2days, 2x2days, 1x2days and stop  Patient Instructions: 1)  Nexium 30 min  before bfast and pepcid 20 mg at bedtime as long as coughing ( reflux is to cough what oxygen is to fire) 2)  If continue with night time add chlortrimeton 4 mg one at bedtime 3)  GERD (REFLUX)  is a common cause of respiratory symptoms. It commonly presents without heartburn and can be treated with medication, but also with lifestyle changes including avoidance of late meals, excessive alcohol, smoking cessation, and avoid fatty foods, chocolate, peppermint, colas, red wine, and acidic juices such as orange juice. NO MINT OR MENTHOL PRODUCTS SO NO COUGH DROPS  4)  USE SUGARLESS CANDY INSTEAD (jolley ranchers)  5)  NO OIL BASED VITAMINS  6)  Prednisone 10mg   4 each am x 2days, 2x2days, 1x2days and stop  7)  Please schedule a follow-up appointment in 4 weeks, sooner if needed  Prescriptions: PREDNISONE 10 MG  TABS (PREDNISONE) 4 each am x 2days, 2x2days, 1x2days and stop  #14 x 0    Entered and Authorized by:   Nyoka Cowden MD   Signed by:   Nyoka Cowden MD on 12/19/2010   Method used:   Electronically to        [facility]  [address] (retail)       [address]       [place], [place]  [address]       Ph: [phone] or [phone]       Fax: [phone]   RxID:   [id number] PEPCID 20 MG TABS (FAMOTIDINE) take one by mouth at bedtime  #34 x 3   Entered and Authorized by:   Nyoka Cowden MD   Signed by:   Nyoka Cowden MD on 12/19/2010   Method used:   Electronically to        [facility]  [address] (retail)       [address]       [place], [place]  [address]       Ph: [phone] or [phone]       Fax: [phone]   RxID:   [id number] NEXIUM 40 MG  CPDR (ESOMEPRAZOLE MAGNESIUM) By mouth daily. Take one half hour before eating.  #34 x 2   Entered and Authorized by:   Nyoka Cowden MD   Signed by:   Nyoka Cowden MD on 12/19/2010   Method used:   [facility]        [facility]  [address] (retail)       [address]       [place], [place]  [address]       Ph: [phone] or [phone]       Fax: [phone]   RxID:   [id number]

## 2010-12-27 NOTE — Miscellaneous (Signed)
Summary: Orders Update  Clinical Lists Changes  Orders: Added new Test order of T-2 View CXR (71020TC) - Signed 

## 2011-01-02 NOTE — Progress Notes (Signed)
  Phone Note Other Incoming   Request: Send information Summary of Call: Mailed patient's chest xray report to address provided on [place] release form completed 12/19/2010.

## 2011-01-23 ENCOUNTER — Other Ambulatory Visit: Payer: 59

## 2011-01-23 ENCOUNTER — Encounter: Payer: Self-pay | Admitting: Internal Medicine

## 2011-01-23 ENCOUNTER — Ambulatory Visit (INDEPENDENT_AMBULATORY_CARE_PROVIDER_SITE_OTHER): Payer: 59 | Admitting: Internal Medicine

## 2011-01-23 ENCOUNTER — Other Ambulatory Visit: Payer: Self-pay | Admitting: Internal Medicine

## 2011-01-23 DIAGNOSIS — R059 Cough, unspecified: Secondary | ICD-10-CM

## 2011-01-23 DIAGNOSIS — R05 Cough: Secondary | ICD-10-CM

## 2011-01-23 DIAGNOSIS — J984 Other disorders of lung: Secondary | ICD-10-CM

## 2011-01-23 LAB — CBC WITH DIFFERENTIAL/PLATELET
Basophils Relative: 0.7 % (ref 0.0–3.0)
Eosinophils Relative: 1.7 % (ref 0.0–5.0)
Hemoglobin: 13.1 g/dL (ref 12.0–15.0)
MCV: 101 fl — ABNORMAL HIGH (ref 78.0–100.0)
Monocytes Absolute: 0.4 10*3/uL (ref 0.1–1.0)
Neutro Abs: 3.4 10*3/uL (ref 1.4–7.7)
Neutrophils Relative %: 60.7 % (ref 43.0–77.0)
RBC: 3.75 Mil/uL — ABNORMAL LOW (ref 3.87–5.11)
WBC: 5.6 10*3/uL (ref 4.5–10.5)

## 2011-01-25 LAB — CONVERTED CEMR LAB: IgE (Immunoglobulin E), Serum: 73.7 intl units/mL (ref 0.0–180.0)

## 2011-01-29 NOTE — Assessment & Plan Note (Signed)
Summary: Pulmonary/ ext f/u eval for cough some better> no change rx   Copy to:  Marisue Brooklyn, DO Primary Provider/Referring Provider:  Marisue Brooklyn, DO  CC:  Cough- some better.  History of Present Illness: 45  yowf never smoker grew up in house of smoking last heavy exp to passive smoke in 1980s  June 27, 2010  1st pulmonary office eval p ct abd 12/14/09 for nonpleuritic cp/abd pain with spn then full ct chest 06/14/10 with MPNs but no more cp,  chronicically with  some aches and pains but no overt rheumatism or travel exp.    December 19, 2010 ov c/o cough since early Jan 2012  productive of green sputum turned white after levaquin and zpak.  tendency to recurrent cough x two years, getting more freq and severe and lingering into daily coughing now since early Jan 2011.    Has cats in bedroom x 11 years  rec Nexium 30 min  before bfast and pepcid 20 mg at bedtime as long as coughing ( reflux is to cough what oxygen is to fire) If continue with night time add chlortrimeton 4 mg one at bedtime GERD (REFLUX)  diet Prednisone 10mg   4 each am x 2days, 2x2days, 1x2days and stop   January 23, 2011 ov Cough- some better p prednisone then leveled out.. no sputum production. no sob. Pt denies any significant sore throat, dysphagia, itching, sneezing,  nasal congestion or excess secretions,  fever, chills, sweats, unintended wt loss, pleuritic or exertional cp, hempoptysis, change in activity tolerance  orthopnea pnd or leg swelling   Current Medications (verified): 1)  Imitrex 100 Mg Tabs (Sumatriptan Succinate) .... Take As Needed 2)  Imitrex 20 Mg/act Soln (Sumatriptan) .... As Needed 3)  Ed-Chlortan 4 Mg Tabs (Chlorpheniramine Maleate) .Marland Kitchen.. 1 Every 6 Hrs As Needed 4)  Aspirin 81 Mg Tbec (Aspirin) .... Take One By Mouth Once Daily 5)  Vitamin D 1000 Unit Tabs (Cholecalciferol) .... Take Three Tabs By Mouth Once Daily 6)  Calcium Carbonate-Vitamin D 600-200 Mg-Unit Tabs (Calcium  Carbonate-Vitamin D) .... Take One By Mouth Once Daily 7)  Low-Ogestrel 0.3-30 Mg-Mcg Tabs (Norgestrel-Ethinyl Estradiol) .... Take One By Mouth Once Daily 8)  Valtrex 500 Mg Tabs (Valacyclovir Hcl) .... As Needed 9)  Vesicare 5 Mg Tabs (Solifenacin Succinate) .... As Needed 10)  Ambien 10 Mg Tabs (Zolpidem Tartrate) .Marland Kitchen.. 1 By Mouth At Bedtime As Needed 11)  Multivitamins  Tabs (Multiple Vitamin) .Marland Kitchen.. 1 By Mouth Daily 12)  Xanax 0.5 Mg Tabs (Alprazolam) .... 1/4 By Mouth As Needed 13)  Nexium 40 Mg  Cpdr (Esomeprazole Magnesium) .... By Mouth Daily. Take One Half Hour Before Eating. 14)  Pepcid 20 Mg Tabs (Famotidine) .... Take One By Mouth At Bedtime  Allergies (verified): 1)  ! Codeine  Past History:  Past Medical History: DYSPNEA (ICD-786.05) ABNORMAL EKG (ICD-794.31) ABN CHEST CT     - orignially detected 12/14/09     - F/u CT chest 06/14/10 SPN bilaterally one 11 mm LLL but not change since original ct. ABD Pain      - EGD neg 12/26/09      - Abd u/s neg 12/28/09  Health Maintenance       - Pneumovax 2000  Chronic Headaches Chronic cough/ recurrent bronchitis since 2010       - Max GERD trial December 19, 2010        - Allergy profile sent January 23, 2011 >>>  Vital Signs:  Patient profile:   45 year old female Weight:      165 pounds O2 Sat:      98 % on Room air Temp:     98.9 degrees F oral Pulse rate:   70 / minute BP sitting:   112 / 74  (left arm)  Vitals Entered By: Vernie Murders (January 23, 2011 4:06 PM)  O2 Flow:  Room air  Physical Exam  Additional Exam:  amb wf nad wt 159 June 27, 2010 > 168 December 19, 2010 > 165 January 23, 2011  HEENT: nl dentition, turbinates, and orophanx. Nl external ear canals without cough reflex NECK :  without JVD/Nodes/TM/ nl carotid upstrokes bilaterally LUNGS: no acc muscle use, clear to A and P bilaterally without cough on insp or exp maneuvers CV:  RRR  no s3 or murmur or increase in P2, no edema  ABD:  soft and nontender  with nl excursion in the supine position. No bruits or organomegaly, bowel sounds nl MS:  warm without deformities, calf tenderness, cyanosis or clubbing SKIN: warm and dry without lesions         Impression & Recommendations:  Problem # 1:  COUGH (ICD-786.2) The most common causes of chronic cough in immunocompetent adults include: upper airway cough syndrome (UACS), previously referred to as postnasal drip syndrome,  caused by variety of rhinosinus conditions; (2) asthma; (3) GERD; (4) chronic bronchitis from cigarette smoking or other inhaled environmental irritants; (5) nonasthmatic eosinophilic bronchitis; and (6) bronchiectasis. These conditions, singly or in combination, have accounted for up to 94% of the causes of chronic cough in prospective studies.  this is most c/w  Classic Upper airway cough syndrome, so named because it's frequently impossible to sort out how much is  CR/sinusitis with freq throat clearing (which can be related to primary GERD)   vs  causing  secondary extra esophageal GERD from wide swings in gastric pressure that occur with throat clearing, promoting self use of mint and menthol lozenges that reduce the lower esophageal sphincter tone and exacerbate the problem further These are the same pts who not infrequently have failed to tolerate ace inhibitors,  dry powder inhalers or biphosphonates or report having reflux symptoms that don't respond to standard doses of PPI  The standardized cough guidelines recently published in Chest are a 14 step process, not a single office visit,  and are intended  to address this problem logically,  with an alogrithm dependent on response to each progressive step  to determine a specific diagnosis with  minimal addtional testing needed. Therefore if compliance is an issue this empiric standardized approach simply won't work. Since improving on present rx no reason to change for another 6 weeks pending results of allergy eval.  Max gerd  rx some better   >  allergy eval next > no eos > send IGE and rast.  Problem # 2:  PULMONARY NODULE (ICD-518.89) MPN bilateral with the  only one > 8 mm  in the LLL and this is unchanged since 01/2010.  ddx = granulomas, non calcified, MAI or related to collagen vasc dz.  Bx very very low yield in setting of ESR nl  so follow with cxr in 6 months  Discussed in detail all the  indications, usual  risks and alternatives  relative to the benefits with patient  and husband  who agree  to proceed with conservative f/u.     Medications Added to Medication List This Visit: 1)  Ed-chlortan 4 Mg Tabs (Chlorpheniramine maleate) .Marland Kitchen.. 1 every 6 hrs as needed  Other Orders: TLB-CBC Platelet - w/Differential ([id number]) T-Allergy Profile [place], [place], [place], [place], [place] [phone]) Est. Patient Level IV [phone])  Clinical Reports Reviewed:  CXR:  12/19/2010: CXR Results:    Comparison: Chest CT 06/14/2010 and chest x-ray 08/02 01/2010  Findings: The cardiac silhouette, mediastinal and hilar contours are within normal limits.  Very prominent infrahilar vessels on the right side due to a pectus deformity.  The small left lower lobe pulmonary nodules seen on the prior chest x-ray is not identified on the radiographs.  No acute pulmonary findings.  The bony thorax is intact.  IMPRESSION: No acute cardiopulmonary findings.   Patient Instructions: 1)  We will call you with lab results 2)  Please schedule a follow-up appointment in 6 weeks, sooner if needed

## 2011-01-31 LAB — CBC
HCT: 40.1 % (ref 36.0–46.0)
Hemoglobin: 13.8 g/dL (ref 12.0–15.0)
MCV: 101.9 fL — ABNORMAL HIGH (ref 78.0–100.0)
Platelets: 214 10*3/uL (ref 150–400)
RBC: 3.94 MIL/uL (ref 3.87–5.11)
WBC: 12.7 10*3/uL — ABNORMAL HIGH (ref 4.0–10.5)

## 2011-01-31 LAB — COMPREHENSIVE METABOLIC PANEL
Albumin: 3.9 g/dL (ref 3.5–5.2)
Alkaline Phosphatase: 72 U/L (ref 39–117)
BUN: 7 mg/dL (ref 6–23)
CO2: 24 mEq/L (ref 19–32)
Chloride: 105 mEq/L (ref 96–112)
Creatinine, Ser: 0.84 mg/dL (ref 0.4–1.2)
GFR calc non Af Amer: >60 mL/min (ref >60)
Glucose, Bld: 115 mg/dL — ABNORMAL HIGH (ref 70–99)
Potassium: 3.7 mEq/L (ref 3.5–5.1)
Total Bilirubin: 0.4 mg/dL (ref 0.3–1.2)

## 2011-01-31 LAB — DIFFERENTIAL
Eosinophils Relative: 0 % (ref 0–5)
Lymphocytes Relative: 6 % — ABNORMAL LOW (ref 12–46)
Monocytes Absolute: 0.4 10*3/uL (ref 0.1–1.0)
Monocytes Relative: 3 % (ref 3–12)
Neutro Abs: 11.6 10*3/uL — ABNORMAL HIGH (ref 1.7–7.7)

## 2011-01-31 LAB — URINALYSIS, ROUTINE W REFLEX MICROSCOPIC
Bilirubin Urine: NEGATIVE
Ketones, ur: 15 mg/dL — AB
Nitrite: NEGATIVE
Urobilinogen, UA: 1 mg/dL (ref 0.0–1.0)
pH: 7 (ref 5.0–8.0)

## 2011-01-31 LAB — POCT PREGNANCY, URINE: Preg Test, Ur: NEGATIVE

## 2011-03-08 ENCOUNTER — Encounter: Payer: Self-pay | Admitting: Internal Medicine

## 2011-03-11 ENCOUNTER — Ambulatory Visit (INDEPENDENT_AMBULATORY_CARE_PROVIDER_SITE_OTHER): Payer: 59 | Admitting: Internal Medicine

## 2011-03-11 ENCOUNTER — Encounter: Payer: Self-pay | Admitting: Internal Medicine

## 2011-03-11 VITALS — BP 106/72 | HR 60 | Temp 98.6°F | Ht 69.0 in | Wt 162.8 lb

## 2011-03-11 DIAGNOSIS — R059 Cough, unspecified: Secondary | ICD-10-CM

## 2011-03-11 DIAGNOSIS — R05 Cough: Secondary | ICD-10-CM

## 2011-03-11 DIAGNOSIS — J984 Other disorders of lung: Secondary | ICD-10-CM

## 2011-03-11 NOTE — Progress Notes (Signed)
Subjective:    Patient ID: Nicole Waller, female    DOB: [birth year], 45 y.o.   MRN: [id number]  HPI  GYN  = [place] Primary Provider/Referring Provider: Marisue Brooklyn, DO   23 yowf never smoker grew up in house of smoking last heavy exp to passive smoke in 1980s   June 27, 2010 1st pulmonary office eval p ct abd 12/14/09 for nonpleuritic cp/abd pain with spn then full ct chest 06/14/10 with MPNs but no more cp, chronicically with some aches and pains but no overt rheumatism or travel exp.   December 19, 2010 ov c/o cough since early Jan 2012 productive of green sputum turned white after levaquin and zpak. tendency to recurrent cough x two years, getting more freq and severe and lingering into daily coughing now since early Jan 2011.  Has cats in bedroom x 11 years  rec Nexium 30 min before bfast and pepcid 20 mg at bedtime as long as coughing ( reflux is to cough what oxygen is to fire)  If continue with night time add chlortrimeton 4 mg one at bedtime  GERD (REFLUX) diet  Prednisone 10mg  4 each am x 2days, 2x2days, 1x2days and stop   January 23, 2011 ov Cough- some better p prednisone then leveled out.. no sputum production. no sob.  rec add 1st gen h1 per guidelines, check allergy profile > neg  03/11/2011 ov cough no better on 1st gen H1 assoc with sensation of too much throat drainage but no excess mucus and no sleeps ok. No sob. Pt denies any significant sore throat, dysphagia, itching, sneezing,  nasal congestion or excess/ purulent secretions,  fever, chills, sweats, unintended wt loss, pleuritic or exertional cp, hempoptysis, orthopnea pnd or leg swelling.    Also denies any obvious fluctuation of symptoms with weather or environmental changes or other aggravating or alleviating factors.        Past Medical History:  ABNORMAL EKG (ICD-794.31)  ABN CHEST CT  - orignially detected 12/14/09  - F/u CT chest 06/14/10 SPN bilaterally one 11 mm LLL but not change since original ct.  ABD  Pain  - EGD neg 12/26/09  - Abd u/s neg 12/28/09  Health Maintenance  - Pneumovax 2000  Chronic Headaches  Chronic cough/ recurrent bronchitis since 2010  - Max GERD trial December 19, 2010  - Allergy profile sent January 23, 2011 >  Neg  - Sinus CT ordered 03/11/2011 >>>   Additional Exam: amb wf nad  s  Impression & Recommendations:  Problem # 1: COUGH (ICD-786.2)  The most common causes of chronic cough in immunocompetent adults include: upper airway cough syndrome (UACS), previously referred to as postnasal drip syndrome, caused by variety of rhinosinus conditions; (2) asthma; (3) GERD; (4) chronic bronchitis from cigarette smoking or other inhaled environmental irritants; (5) nonasthmatic eosinophilic bronchitis; and (6) bronchiectasis. These conditions, singly or in combination, have accounted for up to 94% of the causes of chronic cough in prospective studies.  this is most c/w Classic Upper airway cough syndrome, so named because it's frequently impossible to sort out how much is CR/sinusitis with freq throat clearing (which can be related to primary GERD) vs causing secondary extra esophageal GERD from wide swings in gastric pressure that occur with throat clearing, promoting self use of mint and menthol lozenges that reduce the lower esophageal sphincter tone and exacerbate the problem further These are the same pts who not infrequently have failed to tolerate ace inhibitors, dry powder inhalers or biphosphonates  or report having reflux symptoms that don't respond to standard doses of PPI  The standardized cough guidelines recently published in Chest are a 14 step process, not a single office visit, and are intended to address this problem logically, with an alogrithm dependent on response to each progressive step to determine a specific diagnosis with minimal addtional testing needed. Therefore if compliance is an issue this empiric standardized approach simply won't work. Since improving on  present rx no reason to change for another 6 weeks pending results of allergy eval.  Max gerd rx some better > allergy eval next > no eos > send IGE and rast.  Problem # 2: PULMONARY NODULE (ICD-518.89)  MPN bilateral with the only one > 8 mm in the LLL and this is unchanged since 01/2010. ddx = granulomas, non calcified, MAI or related to collagen vasc dz. Bx very very low yield in setting of ESR nl so follow with cxr in 6 months  Discussed in detail all the indications, usual risks and alternatives relative to the benefits with patient and husband who agree to proceed with conservative f/u.  Medications Added to Medication List This Visit:  1) Ed-chlortan 4 Mg Tabs (Chlorpheniramine maleate) .Marland Kitchen.. 1 every 6 hrs as needed     Review of Systems     Objective:   Physical Exam    wt 159 June 27, 2010 > 168 December 19, 2010 > 165 January 23, 2011 > 162 03/11/2011  HEENT: nl dentition, turbinates, and orophanx. Nl external ear canals without cough reflex  NECK : without JVD/Nodes/TM/ nl carotid upstrokes bilaterally  LUNGS: no acc muscle use, clear to A and P bilaterally without cough on insp or exp maneuvers  CV: RRR no s3 or murmur or increase in P2, no edema  ABD: soft and nontender with nl excursion in the supine position. No bruits or organomegaly, bowel sounds nl  MS: warm without deformities, calf tenderness, cyanosis or clubbing  SKIN: warm and dry without lesion    Assessment & Plan:

## 2011-03-11 NOTE — Patient Instructions (Signed)
GERD (REFLUX)  is an extremely common cause of respiratory symptoms, many times with no significant heartburn at all.    It can be treated with medication, but also with lifestyle changes including avoidance of late meals, excessive alcohol, smoking cessation, and avoid fatty foods, chocolate, peppermint, colas, red wine, and acidic juices such as orange juice.  NO MINT OR MENTHOL PRODUCTS SO NO COUGH DROPS  USE SUGARLESS CANDY INSTEAD (jolley ranchers or Stover's)  NO OIL BASED VITAMINS   I strongly recommend that you find an alternative for your birth control based on national guidelines  Please see patient coordinator before you leave today  to schedule sinus CT   Ok to restart Zyrtec as needed for itching sneezing or excess drainage.  Please schedule a follow up office visit in 6 weeks, call sooner if needed

## 2011-03-11 NOTE — Assessment & Plan Note (Signed)
The most common causes of chronic cough in immunocompetent adults include the following: upper airway cough syndrome (UACS), previously referred to as postnasal drip syndrome (PNDS), which is caused by variety of rhinosinus conditions; (2) asthma; (3) GERD; (4) chronic bronchitis from cigarette smoking or other inhaled environmental irritants; (5) nonasthmatic eosinophilic bronchitis; and (6) bronchiectasis.   These conditions, singly or in combination, have accounted for up to 94% of the causes of chronic cough in prospective studies.   Other conditions have constituted no >6% of the causes in prospective studies These have included bronchogenic carcinoma, chronic interstitial pneumonia, sarcoidosis, left ventricular failure, ACEI-induced cough, and aspiration from a condition associated with pharyngeal dysfunction.   .Chronic cough is often simultaneously caused by more than one condition. A single cause has been found from 38 to 82% of the time, multiple causes from 18 to 62%. Multiply caused cough has been the result of three diseases up to 42% of the time.    This is most c/w  Classic Upper airway cough syndrome, so named because it's frequently impossible to sort out how much is  CR/sinusitis with freq throat clearing (which can be related to primary GERD)   vs  causing  secondary (" extra esophageal")  GERD from wide swings in gastric pressure that occur with throat clearing, often  promoting self use of mint and menthol lozenges that reduce the lower esophageal sphincter tone and exacerbate the problem further in a cyclical fashion.   These are the same pts who not infrequently have failed to tolerate ace inhibitors,  dry powder inhalers or biphosphonates or report having reflux symptoms that don't respond to standard doses of PPI , and are easily confused as having aecopd or asthma flares,   For now continue max gerd rx, diet, and check sinus ct  Strongly consider trial off bcp's or on one  with less progesterone per cough guidelines  Methacholine challenge next step

## 2011-03-13 ENCOUNTER — Ambulatory Visit (INDEPENDENT_AMBULATORY_CARE_PROVIDER_SITE_OTHER)
Admission: RE | Admit: 2011-03-13 | Discharge: 2011-03-13 | Disposition: A | Discharge status: Home / Self Care | Payer: 59 | Source: Ambulatory Visit | Attending: Internal Medicine | Admitting: Internal Medicine

## 2011-03-13 DIAGNOSIS — R05 Cough: Secondary | ICD-10-CM

## 2011-03-13 DIAGNOSIS — R059 Cough, unspecified: Secondary | ICD-10-CM

## 2011-03-14 ENCOUNTER — Telehealth: Payer: Self-pay | Admitting: Internal Medicine

## 2011-03-14 DIAGNOSIS — R05 Cough: Secondary | ICD-10-CM

## 2011-03-14 DIAGNOSIS — R059 Cough, unspecified: Secondary | ICD-10-CM

## 2011-03-14 NOTE — Telephone Encounter (Signed)
Call patient : Study is unremarkable, Chart review indicates she needs a methacholine challenge test to complete the w/u  Spoke w/ pt and she is aware of results. Pt also aware [facility] was going to be set up for her and someone will be contacting her about the apt. Pt verbalized understanding and had no questions Order sent to pcc's

## 2011-03-21 ENCOUNTER — Ambulatory Visit (HOSPITAL_COMMUNITY)
Admission: RE | Admit: 2011-03-21 | Discharge: 2011-03-21 | Disposition: A | Discharge status: Home / Self Care | Payer: 59 | Source: Ambulatory Visit | Attending: Internal Medicine | Admitting: Internal Medicine

## 2011-03-21 DIAGNOSIS — R05 Cough: Secondary | ICD-10-CM | POA: Insufficient documentation

## 2011-03-21 DIAGNOSIS — R059 Cough, unspecified: Secondary | ICD-10-CM | POA: Insufficient documentation

## 2011-03-21 LAB — PULMONARY FUNCTION TEST

## 2011-03-26 NOTE — Assessment & Plan Note (Signed)
[facility]                       [facility] CARDIOLOGY OFFICE NOTE   NAME:Clonch, TYNETTA                         MRN:          [id number]  DATE:03/24/2009                            DOB:          May 23, 1966    REFERRING PHYSICIAN:  Lovenia Kim, D.O.   REASON FOR REFERRAL:  Abnormal EKG.   HISTORY OF PRESENT ILLNESS:  Ms. Nicole Waller is a 45 year old female patient  with no history of coronary artery disease who recently saw her primary  care physician for annual physical.  She apparently had an  electrocardiogram that was reportedly abnormal.  Unfortunately, I do not  have a copy of that electrocardiogram at this time.  Of note, she has  had recent history of shortness of breath with exertion.  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  She has not been as  active as she has been in the past.  She has noted that with trying to  exert herself, she becomes short of breath.  She denies any chest  discomfort.  She denies any palpitations.  She denies any arm or jaw  discomfort, nausea, or diaphoresis.  She denies any orthopnea,  paroxysmal nocturnal dyspnea, or pedal edema.  She denies palpitations  or syncope.  She has noted some lightheadedness with changes in  positioning.  She denies any true orthostasis.   MEDICATIONS:  1. Yaz as directed.  2. Zyrtec OTC daily.  3. Vitamin C.  4. Multivitamin.  5. Coenzyme Q10.  6. Omega-3 vitamins.  7. Vitamin D.  8. Imitrex p.r.n.  9. Valtrex p.r.n.  10.VESIcare p.r.n.   PAST MEDICAL HISTORY:  She denies any history of coronary artery  disease, hypertension, hyperlipidemia or diabetes mellitus.   Her past medical history does include migraine headaches, urge  incontinence, herpes simplex virus type 2, nephrolithiasis, and rosacea.  She is status post right shoulder surgery and left shoulder surgery as  well as foot surgery in the past.   ALLERGIES:  CODEINE causes nausea and  vomiting.   SOCIAL HISTORY:  She denies tobacco abuse.  She works with the [employer] in the [employer].  She is married, has no  children.   FAMILY HISTORY:  Significant for coronary artery disease.  Her father  Richardo Priest) died in his 34s.  He had a history of bypass.  Apparently  both of her grandfathers had a history of coronary artery disease.  Her  paternal grandfather died suddenly in his 66s and the details of this  are unknown.   REVIEW OF SYSTEMS:  Please see HPI.  Denies fevers, chills, cough,  melena, hematochezia, hematuria, or dysuria.  All other systems reviewed  are negative.   PHYSICAL EXAMINATION:  GENERAL:  She is a well nourished and well  developed female, in no distress.  VITAL SIGNS:  Blood pressure 108/70, pulse 70, and weight 164 pounds.  HEENT:  Normal.  NECK:  Without JVD.  LYMPH:  Without lymphadenopathy.  ENDOCRINE:  Without thyromegaly.  CARDIAC:  Normal S1 and S2.  Regular rate and rhythm  without murmur.  LUNGS:  Clear to auscultation bilaterally.  ABDOMEN:  Soft and nontender.  EXTREMITIES:  Without edema.  NEUROLOGIC:  She is alert and oriented x3.  Cranial II through XII  grossly intact.  SKIN:  Warm and dry.  VASCULAR:  No carotid bruits noted bilaterally.   Electrocardiogram in our office demonstrates sinus rhythm with heart  rate of 70.  Normal axis, RSR prime in V1 and V2.  No ischemic changes.   DATA BASE:  Echocardiogram performed at our office in [facility] Mar 21, 2009 as ordered by Dr. Elisabeth Most demonstrated EF of 55-60%, normal wall  motion and no wall motion abnormalities.   ASSESSMENT AND PLAN:  1. Dyspnea with exertion.  The patient's cardiac risk factor profile      is quite low.  Her only real risk factor is a significant family      history.  She was also interviewed and examined by Dr. Daleen Squibb today.      After further review with Dr. Daleen Squibb, we decided to go ahead and      proceed with stress  Myoview study for the possibility of ischemic      heart disease.  2. Abnormal EKG.  Initially we were somewhat concerned about the      possibility of Brugada syndrome.  The patient has really never had      a history of syncope or palpitations or a significant family      history of sudden cardiac death.  In further review of the      electrocardiogram with Dr. Daleen Squibb and further research of Brugada      syndrome, we have decided that her electrocardiogram demonstrates      RSR prime morphology.  This does not appear to be a Brugada like      electrocardiogram.  Therefore, no further workup is warranted at      this time.  The patient has been advised to contact us should she      ever have any palpitations, syncope, or rapid heart beats.   DISPOSITION:  She can follow up with Dr. Daleen Squibb on p.r.n. basis unless her  Myoview studies are abnormal.      Tereso Newcomer, PA-C  Electronically Signed      Jesse Sans. Daleen Squibb, MD, [facility]  Electronically Signed   SW/MedQ  DD: 03/24/2009  DT: 03/25/2009  Job #: [id number]   cc:   Lovenia Kim, D.O.

## 2011-03-27 ENCOUNTER — Encounter: Payer: Self-pay | Admitting: Internal Medicine

## 2011-03-27 ENCOUNTER — Telehealth: Payer: Self-pay | Admitting: Internal Medicine

## 2011-03-27 MED ORDER — MONTELUKAST SODIUM 10 MG PO TABS: 10.0000 mg | ORAL_TABLET | Freq: Every day | ORAL | Status: DC

## 2011-03-27 NOTE — Telephone Encounter (Signed)
Let her know we haven't got it yet but we'll call when we do

## 2011-03-27 NOTE — Telephone Encounter (Signed)
Left message on phone at work (no answer on mobile) that test is positive and needs ov asap to discuss rx options

## 2011-03-27 NOTE — Telephone Encounter (Signed)
Discussed singulair 10 mg one daily until return

## 2011-03-27 NOTE — Telephone Encounter (Signed)
lmomtcb x1 

## 2011-03-27 NOTE — Telephone Encounter (Signed)
Please advise Dr. Sherene Sires pt is requesting her methacholine challenge test results. Thanks  Carver Fila, CMA

## 2011-03-27 NOTE — Telephone Encounter (Signed)
Results just came over the fax; pt aware that I am sending the message and results to MW to review and call patient back with results.Nicole Waller

## 2011-04-09 ENCOUNTER — Telehealth: Payer: Self-pay | Admitting: Internal Medicine

## 2011-04-09 NOTE — Telephone Encounter (Signed)
lmtcb

## 2011-04-09 NOTE — Telephone Encounter (Signed)
Nicole Waller phoned stated she was returning a call to triage she can be reached at [phone].[name]

## 2011-04-09 NOTE — Telephone Encounter (Signed)
LMTCB

## 2011-04-09 NOTE — Telephone Encounter (Signed)
Pt returning [name] call.Nicole Waller

## 2011-04-09 NOTE — Telephone Encounter (Signed)
Try off and see if problems resolve - these are not typical at all of what we normally see with singulair  If resolved then need ov to regroup re longterm rx - if don't resolved then need to see Dr Elisabeth Most

## 2011-04-09 NOTE — Telephone Encounter (Signed)
Spoke with pt and she is c/o having frequent headaches, GI upset, a metallic taste in her mouth and not sleeping well since starting Singulair on 03-23-11. Pt asking for MW recs.  Please advise.Carron Curie, CMA

## 2011-04-09 NOTE — Telephone Encounter (Signed)
[email or web address].Chistina Roston, CMA  

## 2011-04-09 NOTE — Telephone Encounter (Signed)
PT AWARE TO STOP MED AND SEE IF SYMPTOMS GET BETTER, IF THEY DO PT WILL NEED OV WITH DR Sherene Sires TO DISCUSS OTHER OPTIONS, IF SYMPTOMS DO NOT IMPROVE THEN THE MED IS NOT THE PROBLEM AND THE PT WILL NEED TO SEE DR STEVENSON--PT VERBALIZED UNDERSTANDING

## 2011-05-01 ENCOUNTER — Ambulatory Visit (INDEPENDENT_AMBULATORY_CARE_PROVIDER_SITE_OTHER): Payer: 59 | Admitting: Internal Medicine

## 2011-05-01 ENCOUNTER — Encounter: Payer: Self-pay | Admitting: Internal Medicine

## 2011-05-01 VITALS — BP 110/82 | HR 70 | Temp 99.3°F | Ht 69.0 in | Wt 163.6 lb

## 2011-05-01 DIAGNOSIS — J984 Other disorders of lung: Secondary | ICD-10-CM

## 2011-05-01 DIAGNOSIS — R059 Cough, unspecified: Secondary | ICD-10-CM

## 2011-05-01 DIAGNOSIS — R05 Cough: Secondary | ICD-10-CM

## 2011-05-01 MED ORDER — ZAFIRLUKAST 20 MG PO TABS: ORAL_TABLET | ORAL | Status: DC

## 2011-05-01 NOTE — Patient Instructions (Signed)
Start accolate 20 mg Take 30- 60 min before your first and last meals of the day   If can't tolerate accolate the next choice would be Zyflo  Please see patient coordinator before you leave today  to schedule CT Chest limited to the lung nodule in Left Lower lobe to be done in Mid August then follow up visit w/in the next few days after that  If cough worsens restart daily nexium 40 mg Take 30-60 min before first meal of the day and Pepcid 20 mg at bedtime

## 2011-05-03 ENCOUNTER — Encounter: Payer: Self-pay | Admitting: Internal Medicine

## 2011-05-03 NOTE — Progress Notes (Signed)
Subjective:    Patient ID: Nicole Waller, female    DOB: 20-Aug-1966, 45 y.o.   MRN: [id number]  HPI  GYN  = [facility] Primary Provider/Referring Provider: Marisue Brooklyn, DO   42 yowf never smoker grew up in house of smoking last heavy exp to passive smoke in 1980s   June 27, 2010 1st pulmonary office eval p ct abd 12/14/09 for nonpleuritic cp/abd pain with spn then full ct chest 06/14/10 with MPNs but no more cp, chronicically with some aches and pains but no overt rheumatism or travel exp.   December 19, 2010 ov c/o cough since early Jan 2012 productive of green sputum turned white after levaquin and zpak. tendency to recurrent cough x two years, getting more freq and severe and lingering into daily coughing now since early Jan 2011.  Has cats in bedroom x 11 years  rec Nexium 30 min before bfast and pepcid 20 mg at bedtime as long as coughing ( reflux is to cough what oxygen is to fire)  If continue with night time add chlortrimeton 4 mg one at bedtime  GERD (REFLUX) diet  Prednisone 10mg  4 each am x 2days, 2x2days, 1x2days and stop   January 23, 2011 ov Cough- some better p prednisone then leveled out.. no sputum production. no sob.  rec add 1st gen h1 per guidelines, check allergy profile > neg  03/11/2011 ov cough no better on 1st gen H1 assoc with sensation of too much throat drainage but no excess mucus and   sleeps ok. No sob.  rec Sinus CT ordered 03/11/2011 >>> neg  - 03/21/11 MCT > pos at < 2.0 and reproduced symptoms so rec start singulair 10 mg one daily 03/27/2011    05/03/2011 ov/Mayia Megill f/u cough with pos MCT > better on singulair p one week then stopped at week 3 due to ha/ resolved, then cough recurred.  No excess mucus or noct flare.  Pt denies any significant sore throat, dysphagia, itching, sneezing,  nasal congestion or excess/ purulent secretions,  fever, chills, sweats, unintended wt loss, pleuritic or exertional cp, hempoptysis, orthopnea pnd or leg swelling.    Also denies  any obvious fluctuation of symptoms with weather or environmental changes or other aggravating or alleviating factors.      Past Medical History:  ABNORMAL EKG (ICD-794.31)  ABN CHEST CT  - originally  detected 12/14/09  - F/u CT chest 06/14/10 SPN bilaterally one 11 mm LLL but not changed since original ct.  ABD Pain  - EGD neg 12/26/09  - Abd u/s neg 12/28/09  Health Maintenance  - Pneumovax 2000  Chronic Headaches  Chronic cough/ recurrent bronchitis since 2010  - Max GERD trial December 19, 2010  - Allergy profile sent January 23, 2011 >  Neg  - Sinus CT ordered 03/11/2011 >>> neg - 03/21/11  MCT > pos at < 2.0 and reproduced symptoms so rec start singulair 10 mg one daily 03/27/2011 >>> better but intolerable side effects             Objective:   Physical Exam    wt 159 June 27, 2010 > 168 December 19, 2010 > 165 January 23, 2011 > 162 03/11/2011  > 163  05/01/11 HEENT: nl dentition, turbinates, and orophanx. Nl external ear canals without cough reflex  NECK : without JVD/Nodes/TM/ nl carotid upstrokes bilaterally  LUNGS: no acc muscle use, clear to A and P bilaterally without cough on insp or exp maneuvers  CV: RRR no  s3 or murmur or increase in P2, no edema  ABD: soft and nontender with nl excursion in the supine position. No bruits or organomegaly, bowel sounds nl  MS: warm without deformities, calf tenderness, cyanosis or clubbing  SKIN: warm and dry without lesion    Assessment & Plan:

## 2011-05-03 NOTE — Assessment & Plan Note (Signed)
Convincing improvement on singulair but couldn't tol >  Try rx accolate and if can't tol this then try zyflo

## 2011-05-03 NOTE — Assessment & Plan Note (Signed)
Most likely benign and has nothing to do with the cough but will repeat CT at one year interval to be complete

## 2011-07-03 ENCOUNTER — Ambulatory Visit (INDEPENDENT_AMBULATORY_CARE_PROVIDER_SITE_OTHER)
Admission: RE | Admit: 2011-07-03 | Discharge: 2011-07-03 | Disposition: A | Discharge status: Home / Self Care | Payer: 59 | Source: Ambulatory Visit | Attending: Internal Medicine | Admitting: Internal Medicine

## 2011-07-03 ENCOUNTER — Telehealth: Payer: Self-pay | Admitting: Internal Medicine

## 2011-07-03 ENCOUNTER — Other Ambulatory Visit: Payer: Self-pay | Admitting: Internal Medicine

## 2011-07-03 DIAGNOSIS — J984 Other disorders of lung: Secondary | ICD-10-CM

## 2011-07-03 NOTE — Telephone Encounter (Signed)
Pt requesting CT results that was done this morning. Please advise. Carron Curie, CMA

## 2011-07-04 NOTE — Telephone Encounter (Signed)
See result notes- LMTCB.

## 2011-07-05 NOTE — Telephone Encounter (Signed)
Notes Recorded by Sandrea Hughs, MD on 07/03/2011 at 3:02 PM Call patient : Study is c/w multiple nodules, all either the same or smaller, no further ct scans recommended but would repeat cxr yearly as part of an annual eval either here or by her primary doc. Send primary doc a copy of ct and this phone message   Called # provided above - [email or web address] Called pt's home and cell # - [email or web address]

## 2011-07-05 NOTE — Telephone Encounter (Signed)
Pt returned call.  She was informed of CT results and recs per MW.  She verbalized understanding of the results but had concerns regarding the cxr yearly vs no further ct scans.  Per pt, she was told by MW that the nodules do not show up on a cxr.  She would like clarification on this - ? Why do a cxr if nodules do no show up on this vs a yearly ct?  MW, pls advise.  Thanks!   PCP - Amy Elisabeth Most

## 2011-07-09 ENCOUNTER — Encounter: Payer: Self-pay | Admitting: Internal Medicine

## 2011-07-09 ENCOUNTER — Ambulatory Visit (INDEPENDENT_AMBULATORY_CARE_PROVIDER_SITE_OTHER): Payer: 59 | Admitting: Internal Medicine

## 2011-07-09 DIAGNOSIS — R059 Cough, unspecified: Secondary | ICD-10-CM

## 2011-07-09 DIAGNOSIS — J984 Other disorders of lung: Secondary | ICD-10-CM

## 2011-07-09 DIAGNOSIS — R05 Cough: Secondary | ICD-10-CM

## 2011-07-09 MED ORDER — BECLOMETHASONE DIPROPIONATE 80 MCG/ACT IN AERS: 1.0000 | INHALATION_SPRAY | Freq: Two times a day (BID) | RESPIRATORY_TRACT | Status: DC

## 2011-07-09 NOTE — Patient Instructions (Signed)
Qvar 89 try Take one puff first thing in am and then another one  puff about 12 hours later  - increase to 2 puffs each time if not happy     Work on perfecting  inhaler technique:  relax and gently blow all the way out then take a nice smooth deep breath back in, triggering the inhaler at same time you start breathing in.  Hold for up to 5 seconds if you can.  Rinse and gargle with water when done   If your mouth or throat starts to bother you,   I suggest you time the inhaler to your dental care and after using the inhaler(s) brush teeth and tongue with a baking soda containing toothpaste and when you rinse this out, gargle with it first to see if this helps your mouth and throat.     Please schedule a follow up visit in 3 months but call sooner if needed

## 2011-07-09 NOTE — Progress Notes (Signed)
Subjective:    Patient ID: Nicole Waller, female    DOB: 1966/11/08, 45 y.o.   MRN: [id number]  HPI  GYN  = [facility] Primary Provider/Referring Provider: Marisue Brooklyn, DO   89 yowf never smoker grew up in house of smoking last heavy exp to passive smoke in 1980s   December 19, 2010 ov c/o cough since early Jan 2012 productive of green sputum turned white after levaquin and zpak. tendency to recurrent cough x two years, getting more freq and severe and lingering into daily coughing now since early Jan 2011.  Has cats in bedroom x 11 years  rec Nexium 30 min before bfast and pepcid 20 mg at bedtime as long as coughing ( reflux is to cough what oxygen is to fire)  If continue with night time add chlortrimeton 4 mg one at bedtime  GERD (REFLUX) diet  Prednisone 10mg  4 each am x 2days, 2x2days, 1x2days and stop   January 23, 2011 ov Cough- some better p prednisone then leveled out.. no sputum production. no sob.  rec add 1st gen h1 per guidelines, check allergy profile > neg  03/11/2011 ov cough no better on 1st gen H1 assoc with sensation of too much throat drainage but no excess mucus and   sleeps ok. No sob.  rec Sinus CT ordered 03/11/2011 >>> neg  - 03/21/11 MCT > POSITIVE  at < 2.0 and reproduced symptoms so rec start singulair 10 mg one daily 03/27/2011    05/03/2011 ov/Jayle Solarz f/u cough with pos MCT > better on singulair p one week then stopped at week 3 due to ha/ resolved, then cough recurred. rec Start accolate 20 mg Take 30- 60 min before your first and last meals of the day  If can't tolerate accolate the next choice would be Zyflo If cough worsens restart daily nexium 40 mg Take 30-60 min before first meal of the day and Pepcid 20 mg at bedtime   07/09/2011 f/u ov/Skyler Dusing cc cough better, has not restarted acid suppression but watching diet . More bothered by dyspnea than cough. Not able to tolerate accolate or singulair.   Pt denies any significant sore throat, dysphagia, itching,  sneezing,  nasal congestion or excess/ purulent secretions,  fever, chills, sweats, unintended wt loss, pleuritic or exertional cp, hempoptysis, orthopnea pnd or leg swelling.    Also denies any obvious fluctuation of symptoms with weather or environmental changes or other aggravating or alleviating factors.      Past Medical History:  ABNORMAL EKG (ICD-794.31)  ABN CHEST CT  - originally  detected 12/14/09  - F/u CT chest 06/14/10 SPN bilaterally one 11 mm LLL but not changed since original ct > overall stable to improved 07/04/11 ABD Pain  - EGD neg 12/26/09  - Abd u/s neg 12/28/09  Health Maintenance  - Pneumovax 2000  Chronic Headaches  Chronic cough/ recurrent bronchitis since 2010  - Max GERD trial December 19, 2010  - Allergy profile sent January 23, 2011 >  Neg  - Sinus CT ordered 03/11/2011 >>> neg - 03/21/11  MCT > pos at < 2.0 and reproduced symptoms so rec start singulair 10 mg one daily 03/27/2011 >>> better but intolerable side effects  - Qvar 80  2bid  07/09/2011 >>  - HFA 75% 07/09/2011           Objective:   Physical Exam  wt 159 June 27, 2010 >  165 January 23, 2011 > 162 03/11/2011  > 163  05/01/11 >  07/09/2011  166 07/09/2011  HEENT: nl dentition, turbinates, and orophanx. Nl external ear canals without cough reflex  NECK : without JVD/Nodes/TM/ nl carotid upstrokes bilaterally  LUNGS: no acc muscle use, clear to A and P bilaterally without cough on insp or exp maneuvers  CV: RRR no s3 or murmur or increase in P2, no edema  ABD: soft and nontender with nl excursion in the supine position. No bruits or organomegaly, bowel sounds nl  MS: warm without deformities, calf tenderness, cyanosis or clubbing  SKIN: warm and dry without lesion    Assessment & Plan:

## 2011-07-09 NOTE — Assessment & Plan Note (Signed)
Try qvar next up to 80 2bid and change to zyflo if not effective as singulair may have helped

## 2011-07-09 NOTE — Assessment & Plan Note (Signed)
Discussed in detail all the  indications, usual  risks and alternatives  relative to the benefits with patient who agrees to proceed with conservative f/u with cxr yearly the most I would consider   as an option at this point with much less RT than serial CT's.

## 2011-07-16 NOTE — Telephone Encounter (Signed)
This was discussed at [facility] 07/09/11

## 2011-09-12 ENCOUNTER — Ambulatory Visit (HOSPITAL_BASED_OUTPATIENT_CLINIC_OR_DEPARTMENT_OTHER)
Admission: RE | Admit: 2011-09-12 | Discharge: 2011-09-12 | Disposition: A | Discharge status: Home / Self Care | Payer: 59 | Source: Ambulatory Visit | Attending: Orthopedic Surgery | Admitting: Orthopedic Surgery

## 2011-09-12 DIAGNOSIS — M7710 Lateral epicondylitis, unspecified elbow: Secondary | ICD-10-CM | POA: Insufficient documentation

## 2011-09-12 DIAGNOSIS — G43909 Migraine, unspecified, not intractable, without status migrainosus: Secondary | ICD-10-CM | POA: Insufficient documentation

## 2011-09-12 DIAGNOSIS — K219 Gastro-esophageal reflux disease without esophagitis: Secondary | ICD-10-CM | POA: Insufficient documentation

## 2011-09-12 DIAGNOSIS — Z01812 Encounter for preprocedural laboratory examination: Secondary | ICD-10-CM | POA: Insufficient documentation

## 2011-09-30 NOTE — Op Note (Signed)
  NAMEJERRIS, Nicole Waller NO.:  [id number]  MEDICAL RECORD NO.:  [id number]  LOCATION:                                 FACILITY:  PHYSICIAN:  Loreta Ave, M.D. 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  PROCEDURE:  Right elbow exploration.  Debridement of ECRB tendon with resection of areas of mucinous degeneration.  Debridement of ossification from epicondyle and then repair of superficial extensors with a 2.4 mm anchor and FiberWire suture.  SURGEON:  Loreta Ave, MD.  ASSISTANT:  Genene Churn. Barry Dienes, [place].  ANESTHESIA:  General.  BLOOD LOSS:  Minimal.  SPECIMENS:  None.  COUNTS:  None.  COMPLICATION:  None.  DRESSINGS:  Soft compressive, long-arm splint.  TOURNIQUET TIME:  One hour.  DESCRIPTION OF PROCEDURE:  The patient was brought to the operating room, placed on the operating table in supine position.  After adequate anesthesia had been obtained, right elbow examined.  Full motion, stable elbow.  Exsanguinated with elevation of Esmarch.  Tourniquet inflated to 250 mmHg.  Straight incision above the lateral epicondyle distally. Skin and subcutaneous tissue divided.  Partial tearing, central third of the superficial extensors with ossification/calcification at the tip of the epicondyle.  These were split longitudinally.  The epicondyle debrided.  More than half the attachment had been compromised.  Looking through the gap, the entire ring ECRB tendon and lateral capsule had torn mucinous degeneration from the  epicondyle all the way down to the radial head.  All abnormal torn tissue debrided.  Wound irrigated.  The elbow itself was inspected without degenerative change or instability. A 2.4 mm anchor was predrilled and placed at the tip of the epicondyle. The FiberWire suture from that was then weaved down and backup firmly reapproximating, repairing, reattached in the tendons down to the epicondyle.  Full passive motion at completion.  Wound irrigated and closed with subcutaneous and subcuticular Vicryl.  Sterile compressive dressing applied.  Tourniquet deflated and removed.  Long-arm splint applied.  Anesthesia reversed.  Brought to the room.  Tolerated surgery well.  No complications.     Loreta Ave, M.D.     DFM/MEDQ  D:  09/12/2011  T:  09/13/2011  Job:  [id number]  Electronically Signed by Mckinley Jewel M.D. on 09/30/2011 12:23:36 PM

## 2012-07-12 ENCOUNTER — Other Ambulatory Visit: Payer: Self-pay | Admitting: Internal Medicine

## 2012-09-14 ENCOUNTER — Other Ambulatory Visit: Payer: Self-pay | Admitting: Obstetrics and Gynecology

## 2012-09-14 DIAGNOSIS — R1032 Left lower quadrant pain: Secondary | ICD-10-CM

## 2012-09-16 ENCOUNTER — Ambulatory Visit
Admission: RE | Admit: 2012-09-16 | Discharge: 2012-09-16 | Disposition: A | Discharge status: Home / Self Care | Payer: 59 | Source: Ambulatory Visit | Attending: Obstetrics and Gynecology | Admitting: Obstetrics and Gynecology

## 2012-09-16 DIAGNOSIS — R1032 Left lower quadrant pain: Secondary | ICD-10-CM

## 2012-09-16 MED ORDER — GADOBENATE DIMEGLUMINE 529 MG/ML IV SOLN
20.0000 mL | Freq: Once | INTRAVENOUS | Status: AC | PRN
  Administered 2012-09-16: 20 mL via INTRAVENOUS

## 2012-12-28 ENCOUNTER — Encounter: Payer: Self-pay | Admitting: Internal Medicine

## 2013-03-25 ENCOUNTER — Other Ambulatory Visit: Payer: Self-pay | Admitting: Internal Medicine

## 2013-03-25 DIAGNOSIS — Q254 Other congenital malformations of aorta: Secondary | ICD-10-CM

## 2013-03-25 DIAGNOSIS — G43109 Migraine with aura, not intractable, without status migrainosus: Secondary | ICD-10-CM

## 2013-03-27 ENCOUNTER — Ambulatory Visit
Admission: RE | Admit: 2013-03-27 | Discharge: 2013-03-27 | Disposition: A | Discharge status: Home / Self Care | Payer: 59 | Source: Ambulatory Visit | Attending: Internal Medicine | Admitting: Internal Medicine

## 2013-03-27 DIAGNOSIS — Q254 Other congenital malformations of aorta: Secondary | ICD-10-CM

## 2013-03-27 DIAGNOSIS — G43109 Migraine with aura, not intractable, without status migrainosus: Secondary | ICD-10-CM

## 2013-04-11 DIAGNOSIS — S0990XA Unspecified injury of head, initial encounter: Secondary | ICD-10-CM

## 2013-04-11 HISTORY — DX: Unspecified injury of head, initial encounter: S09.90XA

## 2013-04-13 ENCOUNTER — Emergency Department (HOSPITAL_COMMUNITY): Payer: 59

## 2013-04-13 ENCOUNTER — Encounter (HOSPITAL_COMMUNITY): Payer: Self-pay | Admitting: *Deleted

## 2013-04-13 ENCOUNTER — Emergency Department (HOSPITAL_COMMUNITY)
Admission: EM | Admit: 2013-04-13 | Discharge: 2013-04-13 | Disposition: A | Discharge status: Home / Self Care | Payer: 59 | Attending: Emergency Medicine | Admitting: Emergency Medicine

## 2013-04-13 DIAGNOSIS — Y929 Unspecified place or not applicable: Secondary | ICD-10-CM | POA: Insufficient documentation

## 2013-04-13 DIAGNOSIS — S0083XA Contusion of other part of head, initial encounter: Secondary | ICD-10-CM

## 2013-04-13 DIAGNOSIS — Z3202 Encounter for pregnancy test, result negative: Secondary | ICD-10-CM | POA: Insufficient documentation

## 2013-04-13 DIAGNOSIS — S060X0A Concussion without loss of consciousness, initial encounter: Secondary | ICD-10-CM | POA: Insufficient documentation

## 2013-04-13 DIAGNOSIS — Z79899 Other long term (current) drug therapy: Secondary | ICD-10-CM | POA: Insufficient documentation

## 2013-04-13 DIAGNOSIS — G8929 Other chronic pain: Secondary | ICD-10-CM | POA: Insufficient documentation

## 2013-04-13 DIAGNOSIS — R05 Cough: Secondary | ICD-10-CM | POA: Insufficient documentation

## 2013-04-13 DIAGNOSIS — R059 Cough, unspecified: Secondary | ICD-10-CM | POA: Insufficient documentation

## 2013-04-13 DIAGNOSIS — Z7982 Long term (current) use of aspirin: Secondary | ICD-10-CM | POA: Insufficient documentation

## 2013-04-13 DIAGNOSIS — Y939 Activity, unspecified: Secondary | ICD-10-CM | POA: Insufficient documentation

## 2013-04-13 DIAGNOSIS — S0003XA Contusion of scalp, initial encounter: Secondary | ICD-10-CM | POA: Insufficient documentation

## 2013-04-13 DIAGNOSIS — W64XXXA Exposure to other animate mechanical forces, initial encounter: Secondary | ICD-10-CM | POA: Insufficient documentation

## 2013-04-13 LAB — POCT PREGNANCY, URINE: Preg Test, Ur: NEGATIVE

## 2013-04-13 MED ORDER — IBUPROFEN 800 MG PO TABS
800.0000 mg | ORAL_TABLET | Freq: Once | ORAL | Status: AC
  Administered 2013-04-13: 800 mg via ORAL
  Filled 2013-04-13: qty 1
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
## 2013-04-13 NOTE — ED Notes (Signed)
NURSE FIRST ROUNDS : NURSE EXPLAINED DELAY , WAIT TIME AND PROCESS TO PT. RESPIRATIONS UNLABORED .
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
## 2013-04-14 NOTE — ED Provider Notes (Signed)
Medical screening examination/treatment/procedure(s) were performed by non-physician practitioner and as supervising physician I was immediately available for consultation/collaboration.   Glynn Octave, MD 04/14/13 [phone]

## 2013-04-26 ENCOUNTER — Other Ambulatory Visit: Payer: Self-pay | Admitting: Emergency Medicine

## 2013-04-26 ENCOUNTER — Ambulatory Visit
Admission: RE | Admit: 2013-04-26 | Discharge: 2013-04-26 | Disposition: A | Discharge status: Home / Self Care | Payer: 59 | Source: Ambulatory Visit | Attending: Emergency Medicine | Admitting: Emergency Medicine

## 2013-04-26 DIAGNOSIS — S0990XA Unspecified injury of head, initial encounter: Secondary | ICD-10-CM

## 2013-05-13 ENCOUNTER — Other Ambulatory Visit: Payer: Self-pay | Admitting: Internal Medicine

## 2013-05-13 DIAGNOSIS — R413 Other amnesia: Secondary | ICD-10-CM

## 2013-05-15 ENCOUNTER — Ambulatory Visit
Admission: RE | Admit: 2013-05-15 | Discharge: 2013-05-15 | Disposition: A | Discharge status: Home / Self Care | Payer: 59 | Source: Ambulatory Visit | Attending: Internal Medicine | Admitting: Internal Medicine

## 2013-05-15 ENCOUNTER — Other Ambulatory Visit: Payer: 59

## 2013-05-15 DIAGNOSIS — R413 Other amnesia: Secondary | ICD-10-CM

## 2013-05-15 MED ORDER — GADOBENATE DIMEGLUMINE 529 MG/ML IV SOLN
15.0000 mL | Freq: Once | INTRAVENOUS | Status: AC | PRN
  Administered 2013-05-15: 15 mL via INTRAVENOUS

## 2013-05-25 DIAGNOSIS — G43009 Migraine without aura, not intractable, without status migrainosus: Secondary | ICD-10-CM | POA: Insufficient documentation

## 2013-06-10 ENCOUNTER — Ambulatory Visit (INDEPENDENT_AMBULATORY_CARE_PROVIDER_SITE_OTHER): Payer: 59 | Admitting: Internal Medicine

## 2013-06-10 ENCOUNTER — Encounter: Payer: Self-pay | Admitting: Internal Medicine

## 2013-06-10 VITALS — BP 110/70 | HR 86 | Temp 98.1°F | Ht 69.0 in | Wt 170.8 lb

## 2013-06-10 DIAGNOSIS — J45991 Cough variant asthma: Secondary | ICD-10-CM

## 2013-06-10 MED ORDER — BECLOMETHASONE DIPROPIONATE 80 MCG/ACT IN AERS: INHALATION_SPRAY | RESPIRATORY_TRACT | Status: DC
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
## 2013-06-10 NOTE — Patient Instructions (Addendum)
No change in recommendations  Keep up the good work on the inhaler technique you mastered today  Follow up as needed, yearly for refills

## 2013-06-13 NOTE — Assessment & Plan Note (Signed)
Chronic cough/ recurrent bronchitis since 2010  - Max GERD trial December 19, 2010  - Allergy profile sent January 23, 2011 >  Neg  - Sinus CT ordered 03/11/2011 >>> neg - 03/21/11  MCT > pos at < 2.0 and reproduced symptoms so rec start singulair 10 mg one daily 03/27/2011 >>> better but intolerable side effects - Trial of accolate 20 mg bid 05/01/2011  > could not tolerate  - Qvar 80  2bid  07/09/2011 >>  The proper method of use, as well as anticipated side effects, of a metered-dose inhaler are discussed and demonstrated to the patient. Improved effectiveness after extensive coaching during this visit to a level of approximately  75%  All goals of chronic asthma control met including optimal function and elimination of symptoms with minimal need for rescue therapy.  Contingencies discussed in full including contacting this office immediately if not controlling the symptoms using the rule of two's.

## 2013-09-16 ENCOUNTER — Other Ambulatory Visit: Payer: Self-pay

## 2013-10-11 ENCOUNTER — Telehealth: Payer: Self-pay | Admitting: *Deleted

## 2013-10-11 MED ORDER — AZITHROMYCIN 250 MG PO TABS: ORAL_TABLET | ORAL | Status: AC

## 2013-10-11 NOTE — Telephone Encounter (Signed)
PT IS CALLING SAID HAS SINUS INFECTION. GREEN MUCUS, SINUS PRESSURE,HEADACHE. ASKING COULD WE CALL IN A RX FOR HER? [facility] ( [place])

## 2013-10-12 ENCOUNTER — Other Ambulatory Visit: Payer: Self-pay | Admitting: Emergency Medicine

## 2013-10-12 ENCOUNTER — Ambulatory Visit: Payer: Self-pay | Admitting: Emergency Medicine

## 2013-10-12 MED ORDER — SUMATRIPTAN SUCCINATE 100 MG PO TABS: 100.0000 mg | ORAL_TABLET | ORAL | Status: DC | PRN

## 2013-10-19 ENCOUNTER — Other Ambulatory Visit: Payer: Self-pay | Admitting: Emergency Medicine

## 2013-10-19 MED ORDER — ALPRAZOLAM 0.5 MG PO TABS: ORAL_TABLET | ORAL | Status: DC

## 2013-12-02 ENCOUNTER — Other Ambulatory Visit: Payer: Self-pay | Admitting: Physician Assistant

## 2013-12-02 MED ORDER — SUMATRIPTAN 20 MG/ACT NA SOLN: 1.0000 | NASAL | Status: DC | PRN

## 2014-01-05 ENCOUNTER — Other Ambulatory Visit: Payer: Self-pay | Admitting: Emergency Medicine

## 2014-03-10 ENCOUNTER — Other Ambulatory Visit: Payer: Self-pay | Admitting: Emergency Medicine

## 2014-03-11 NOTE — Telephone Encounter (Signed)
Please call patient or pharmacy and see if she filled the imitrex on the 28th. If so TOO soon and needs OV.

## 2014-03-28 ENCOUNTER — Encounter: Payer: Self-pay | Admitting: *Deleted

## 2014-03-28 DIAGNOSIS — F419 Anxiety disorder, unspecified: Secondary | ICD-10-CM | POA: Insufficient documentation

## 2014-03-28 DIAGNOSIS — L719 Rosacea, unspecified: Secondary | ICD-10-CM | POA: Insufficient documentation

## 2014-03-28 DIAGNOSIS — Z87442 Personal history of urinary calculi: Secondary | ICD-10-CM | POA: Insufficient documentation

## 2014-03-28 DIAGNOSIS — Z8669 Personal history of other diseases of the nervous system and sense organs: Secondary | ICD-10-CM | POA: Insufficient documentation

## 2014-03-29 ENCOUNTER — Ambulatory Visit (INDEPENDENT_AMBULATORY_CARE_PROVIDER_SITE_OTHER): Payer: 59 | Admitting: Emergency Medicine

## 2014-03-29 ENCOUNTER — Encounter: Payer: Self-pay | Admitting: Emergency Medicine

## 2014-03-29 VITALS — BP 102/70 | HR 66 | Temp 98.2°F | Resp 18 | Ht 68.25 in | Wt 163.0 lb

## 2014-03-29 DIAGNOSIS — Z0000 Encounter for general adult medical examination without abnormal findings: Secondary | ICD-10-CM

## 2014-03-29 DIAGNOSIS — M5430 Sciatica, unspecified side: Secondary | ICD-10-CM

## 2014-03-29 DIAGNOSIS — Z1212 Encounter for screening for malignant neoplasm of rectum: Secondary | ICD-10-CM

## 2014-03-29 LAB — CBC WITH DIFFERENTIAL/PLATELET
BASOS ABS: 0 10*3/uL (ref 0.0–0.1)
Basophils Relative: 0 % (ref 0–1)
Eosinophils Absolute: 0.1 10*3/uL (ref 0.0–0.7)
Eosinophils Relative: 1 % (ref 0–5)
HCT: 41.4 % (ref 36.0–46.0)
Hemoglobin: 14 g/dL (ref 12.0–15.0)
LYMPHS ABS: 1.3 10*3/uL (ref 0.7–4.0)
LYMPHS PCT: 19 % (ref 12–46)
MCH: 33 pg (ref 26.0–34.0)
MCHC: 33.8 g/dL (ref 30.0–36.0)
MCV: 97.6 fL (ref 78.0–100.0)
Monocytes Absolute: 0.3 10*3/uL (ref 0.1–1.0)
Monocytes Relative: 4 % (ref 3–12)
NEUTROS ABS: 5.3 10*3/uL (ref 1.7–7.7)
Neutrophils Relative %: 76 % (ref 43–77)
PLATELETS: 248 10*3/uL (ref 150–400)
RBC: 4.24 MIL/uL (ref 3.87–5.11)
RDW: 13.8 % (ref 11.5–15.5)
WBC: 7 10*3/uL (ref 4.0–10.5)

## 2014-03-29 LAB — HEMOGLOBIN A1C
Hgb A1c MFr Bld: 5.3 % (ref <5.7)
MEAN PLASMA GLUCOSE: 105 mg/dL (ref <117)
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
## 2014-03-29 NOTE — Patient Instructions (Signed)
Traumatic Brain Injury Traumatic brain injury (TBI) occurs when an injury to the head causes the brain to move back and forth. The risk of brain injury varies with the severity of the trauma. The damage can be confined to one area of the brain (focal) or involve different areas of the brain (diffuse). The severity of a brain injury can range from:  A blow or jolt to the head that disrupts the normal function of the brain (concussion).  A deep state of unconsciousness (coma).  Death. CAUSES  A brain injury can result from:  A closed head injury. This occurs when the head suddenly and violently hits an object but the object does not break through the skull. Examples include:  A direct blow (hitting your head on a hard surface).  An indirect blow (when your head moves rapidly and violently back and forth, like in a car crash). This injury is called contrecoup (involving a blow and counter blow). Shaken baby syndrome is a severe type of this injury. It happens when a baby is shaken forcibly enough to cause extreme contrecoup injury.  Penetrating head injury. A penetrating head injury occurs when an object pierces the skull and enters the brain tissue. Examples include:  A skull fracture occurs when the skull cracks or breaks.  A depressed skull fracture occurs when pieces of the broken skull press into the tissue of the brain. This can cause bruising of the brain tissue called a contusion. In both closed and penetrating head injuries, damage to blood vessels can cause heavy bleeding into or around the brain.  SYMPTOMS  The symptoms of a TBI depend on the type and severity of the injury. The most common symptoms include:   Confusion (disorientation) or other thinking problems.  An inability to remember events around the time of the injury (amnesia).  Difficulty staying awake or passing out (loss of consciousness).  Difficulty maintaining your balance or feeling unsteady.  Slow reaction  time.  Difficulty learning or remembering things you have heard.  Headache.  Blurry vision.  Vomiting.  Seizures.  Swelling of the scalp. This occurs because of bleeding or swelling under the skin of the skull when the head is hit. TREATMENT Treatment of traumatic brain injury can involve a range of different medical options:  If a brain injury is moderate to severe, a hospital stay will be necessary to monitor:  Neurological status.  Pressure or swelling of the brain (intracranial pressure).  For seizures.  Severe brain injury cases may need surgery to:  Control bleeding.  Relieve pressure on the brain.  Remove objects from the brain that result from a penetrating injury.  Repair the skull from an injury.  Long-term treatment of a brain injury can involve rehabilitation work such as:  Physical therapy.  Occupational therapy.  Speech therapy. PROGNOSIS The outcome of TBI depends on the cause of the injury, location, severity, and extent of neurological damage. Outcomes range from good recovery to death. Long term consequences of a TBI can include:  Difficulty concentrating or having a short attention span.  Change in personality.  Irritability.  Headaches.  Blurry vision.  Sleepiness.  Depression.  Unsteadiness that makes walking or standing hard to do. For more information and support, contact: The National Institute of Neurological Disorders and Stroke.  Document Released: 10/18/2002 Document Revised: 08/18/2013 Document Reviewed: 10/26/2009 [employer] Patient Information 2014 [employer].  

## 2014-03-30 ENCOUNTER — Encounter: Payer: Self-pay | Admitting: Emergency Medicine

## 2014-03-30 LAB — VITAMIN D 25 HYDROXY (VIT D DEFICIENCY, FRACTURES): VIT D 25 HYDROXY: 72 ng/mL (ref 30–89)

## 2014-03-30 LAB — MICROALBUMIN / CREATININE URINE RATIO
Creatinine, Urine: 114 mg/dL
MICROALB/CREAT RATIO: 4.4 mg/g (ref 0.0–30.0)
Microalb, Ur: 0.5 mg/dL (ref 0.00–1.89)

## 2014-03-30 LAB — URINALYSIS, ROUTINE W REFLEX MICROSCOPIC
Bilirubin Urine: NEGATIVE
Glucose, UA: NEGATIVE mg/dL
HGB URINE DIPSTICK: NEGATIVE
KETONES UR: NEGATIVE mg/dL
LEUKOCYTES UA: NEGATIVE
NITRITE: NEGATIVE
PH: 5.5 (ref 5.0–8.0)
PROTEIN: NEGATIVE mg/dL
Specific Gravity, Urine: 1.012 (ref 1.005–1.030)
UROBILINOGEN UA: 0.2 mg/dL (ref 0.0–1.0)

## 2014-03-30 LAB — MAGNESIUM: MAGNESIUM: 2 mg/dL (ref 1.5–2.5)

## 2014-03-30 LAB — HEPATIC FUNCTION PANEL
ALT: 18 U/L (ref 0–35)
AST: 23 U/L (ref 0–37)
Albumin: 4.3 g/dL (ref 3.5–5.2)
Alkaline Phosphatase: 82 U/L (ref 39–117)
Bilirubin, Direct: 0.1 mg/dL (ref 0.0–0.3)
Indirect Bilirubin: 0.3 mg/dL (ref 0.2–1.2)
TOTAL PROTEIN: 7.2 g/dL (ref 6.0–8.3)
Total Bilirubin: 0.4 mg/dL (ref 0.2–1.2)

## 2014-03-30 LAB — LIPID PANEL
CHOLESTEROL: 172 mg/dL (ref 0–200)
HDL: 75 mg/dL (ref >39)
LDL CALC: 79 mg/dL (ref 0–99)
TRIGLYCERIDES: 88 mg/dL (ref <150)
Total CHOL/HDL Ratio: 2.3 Ratio
VLDL: 18 mg/dL (ref 0–40)

## 2014-03-30 LAB — BASIC METABOLIC PANEL WITH GFR
BUN: 13 mg/dL (ref 6–23)
CHLORIDE: 105 meq/L (ref 96–112)
CO2: 26 meq/L (ref 19–32)
CREATININE: 0.94 mg/dL (ref 0.50–1.10)
Calcium: 9.3 mg/dL (ref 8.4–10.5)
GFR, EST NON AFRICAN AMERICAN: 72 mL/min
GFR, Est African American: 84 mL/min
Glucose, Bld: 83 mg/dL (ref 70–99)
POTASSIUM: 4.1 meq/L (ref 3.5–5.3)
Sodium: 139 mEq/L (ref 135–145)

## 2014-03-30 LAB — TSH: TSH: 2.003 u[IU]/mL (ref 0.350–4.500)

## 2014-03-30 LAB — INSULIN, FASTING: INSULIN FASTING, SERUM: 7 u[IU]/mL (ref 3–28)

## 2014-03-30 LAB — VITAMIN B12: Vitamin B-12: 374 pg/mL (ref 211–911)

## 2014-05-06 ENCOUNTER — Other Ambulatory Visit: Payer: Self-pay | Admitting: Physician Assistant

## 2014-05-17 ENCOUNTER — Other Ambulatory Visit: Payer: Self-pay | Admitting: Physician Assistant

## 2014-07-11 ENCOUNTER — Other Ambulatory Visit: Payer: Self-pay | Admitting: Emergency Medicine

## 2014-08-06 ENCOUNTER — Other Ambulatory Visit: Payer: Self-pay | Admitting: Emergency Medicine

## 2014-10-21 ENCOUNTER — Other Ambulatory Visit: Payer: Self-pay | Admitting: Physician Assistant

## 2014-12-07 ENCOUNTER — Other Ambulatory Visit: Payer: Self-pay | Admitting: Physician Assistant

## 2014-12-07 ENCOUNTER — Other Ambulatory Visit: Payer: Self-pay | Admitting: Internal Medicine

## 2015-01-02 ENCOUNTER — Ambulatory Visit (INDEPENDENT_AMBULATORY_CARE_PROVIDER_SITE_OTHER): Payer: 59 | Admitting: Internal Medicine

## 2015-01-02 ENCOUNTER — Encounter: Payer: Self-pay | Admitting: Internal Medicine

## 2015-01-02 VITALS — BP 106/70 | HR 76 | Ht 68.5 in | Wt 171.2 lb

## 2015-01-02 DIAGNOSIS — J45991 Cough variant asthma: Secondary | ICD-10-CM

## 2015-01-02 MED ORDER — BECLOMETHASONE DIPROPIONATE 80 MCG/ACT IN AERS: INHALATION_SPRAY | RESPIRATORY_TRACT | Status: DC

## 2015-01-02 MED ORDER — ALBUTEROL SULFATE HFA 108 (90 BASE) MCG/ACT IN AERS: INHALATION_SPRAY | RESPIRATORY_TRACT | Status: DC

## 2015-01-02 NOTE — Progress Notes (Signed)
Subjective:    Patient ID: Nicole Waller, female    DOB: 1965-12-25, 49 y.o.   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 tendency to recurrent cough x two years, getting more freq and severe and lingering into daily coughing now since early Jan 2011.  Has cats in bedroom x 11 years  rec Nexium 30 min before bfast and pepcid 20 mg at bedtime as long as coughing ( reflux is to cough what oxygen is to fire)  If continue with night time add chlortrimeton 4 mg one at bedtime  GERD (REFLUX) diet  Prednisone 10mg  4 each am x 2days, 2x2days, 1x2days and stop   January 23, 2011 ov Cough- some better p prednisone then leveled out.. no sputum production. no sob.  rec add 1st gen h1 per guidelines, check allergy profile > neg  03/11/2011 ov cough no better on 1st gen H1 assoc with sensation of too much throat drainage but no excess mucus and   sleeps ok. No sob.  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 rec Start accolate 20 mg Take 30- 60 min before your first and last meals of the day  If can't tolerate accolate the next choice would be Zyflo If cough worsens restart daily nexium 40 mg Take 30-60 min before first meal of the day and Pepcid 20 mg at bedtime   07/09/2011 f/u ov/Biff Rutigliano cc cough better, has not restarted acid suppression but watching diet . More bothered by dyspnea than cough. Not able to tolerate accolate or singulair.   rec Qvar 80 try Take one puff first thing in am and then another one  puff about 12 hours later  - increase to 2 puffs each time if not happy  06/10/2013 f/u ov/Rosalia Mcavoy  Chief Complaint  Patient presents with  . Follow-up    Pt states that her breathing is overall doing well. She denies any co's today.   no limiting doe rec No change in recommendations Keep up the good work on the inhaler technique you mastered today Follow up as needed, yearly for refills    01/02/2015 f/u ov/Zollie Clemence re: cough variant asthma  Chief Complaint  Patient presents with  . Follow-up    Pt c/o increased cough and SOB for the past 6 wks. Cough is non prod and seems worse in the am.    qvar was 80 one or two puffs daily and doing great  Until ran out then gradually worse dry cough and sob p stopped it.   No obvious daytime variabilty or assoc cp or chest tightness, subjective wheeze overt sinus or hb symptoms. No unusual exp hx or h/o childhood pna/ asthma or knowledge of premature birth.   Sleeping ok without nocturnal  or early am exacerbation  of respiratory  c/o's or need for noct saba. Also denies any obvious  fluctuation of symptoms with weather or environmental changes or other aggravating or alleviating factors except as outlined above   Current Medications, Allergies, Complete Past Medical History, Past Surgical History, Family History, and Social History were reviewed in [employer] record.  ROS  The following are not active complaints unless bolded sore throat, dysphagia, dental problems, itching, sneezing,  nasal congestion or excess/ purulent secretions, ear ache,   fever, chills, sweats, unintended wt loss, pleuritic or exertional cp, hemoptysis,  orthopnea pnd or leg swelling, presyncope, palpitations, heartburn, abdominal pain, anorexia, nausea, vomiting, diarrhea  or change in bowel or urinary habits, change in stools or urine, dysuria,hematuria,  rash, arthralgias, visual  complaints, headache, numbness weakness or ataxia or problems with walking or coordination,  change in mood/affect or memory.          Past Medical History:  ABNORMAL EKG (ICD-794.31)  ABN CHEST CT  - originally  detected 12/14/09  - F/u CT chest 06/14/10 SPN bilaterally one 11 mm LLL but not changed since original ct > overall stable to improved 07/04/11 ABD Pain  - EGD neg 12/26/09  - Abd u/s neg 12/28/09  Health Maintenance  - Pneumovax 2000  Chronic Headaches  Chronic cough/ recurrent bronchitis since 2010  - Max GERD trial December 19, 2010  - Allergy profile sent January 23, 2011 >  Neg  - Sinus CT ordered 03/11/2011 >>> neg - 03/21/11  MCT > pos at < 2.0 and reproduced symptoms so rec start singulair 10 mg one daily 03/27/2011 >>> better but intolerable side effects  - Qvar 80   07/09/2011 >>  - HFA 75% 07/09/2011           Objective:   Physical Exam  wt 159 June 27, 2010 >  165 January 23, 2011 > 162 03/11/2011  > 163  05/01/11 > 07/09/2011  166 07/09/2011  > 06/10/2013 171> 01/02/2015  171  HEENT: nl dentition, turbinates, and orophanx. Nl external ear canals without cough reflex  NECK : without JVD/Nodes/TM/ nl carotid upstrokes bilaterally  LUNGS: no acc muscle use, clear to A and P bilaterally without cough on insp or exp maneuvers  CV: RRR no s3 or murmur or increase in P2, no edema  ABD: soft and nontender with nl excursion in the supine position. No bruits or organomegaly, bowel sounds nl  MS: warm without deformities, calf tenderness, cyanosis or clubbing  SKIN: warm and dry without lesion    Assessment & Plan:

## 2015-01-02 NOTE — Patient Instructions (Addendum)
qvar 80 standard dose is Take 2 puffs first thing in am and then another 2 puffs about 12 hours later - this is the dose you should use if you are not doing great  Only use your albuterol (proair) as a rescue medication to be used if you can't catch your breath by resting  - you can use it before exercise occasionally to see if helps you ex tolerance but should be takein 5-15 min before and it should make a lot of difference and if not, the problem is not asthma but conditioning - The less you use it, the better it will work when you need it. - Ok to use up to 2 puffs  every 4 hours if you must but call for immediate appointment if use goes up over your usual need - Don't leave home without it !!  (think of it like the spare tire for your car)    If you are satisfied with your treatment plan,  let your doctor know and he/she can either refill your medications or you can return here when your prescription runs out.     If in any way you are not 100% satisfied,  please tell us.  If 100% better, tell your friends!  Pulmonary follow up is as needed

## 2015-01-05 ENCOUNTER — Encounter: Payer: Self-pay | Admitting: Internal Medicine

## 2015-01-05 NOTE — Assessment & Plan Note (Signed)
Chronic cough/ recurrent bronchitis since 2010  - Max GERD trial December 19, 2010  - Allergy profile sent January 23, 2011 >  Neg  - Sinus CT ordered 03/11/2011 >>> neg - 03/21/11  MCT > pos at < 2.0 and reproduced symptoms so rec start singulair 10 mg one daily 03/27/2011 >>> better but intolerable side effects - Trial of accolate 20 mg bid 05/01/2011  > could not tolerate  - rec Qvar 80  2bid  07/09/2011 >>  - 01/02/2015 p extensive coaching HFA effectiveness =    75%   I had an extended discussion with the patient reviewing all relevant studies completed to date and  lasting 15 to 20 minutes of a 25 minute visit on the following ongoing concerns:  1) no question she has chronic asthma that requires consistent rx  2) ok to wean the qvar 80 a bit as long as no symptoms or need for saba  3) emphasized though that the qvar is very slow to work and very slow to stop working so it's difficult to "titrate" and if not going great the appropriate dose is Take 2 puffs first thing in am and then another 2 puffs about 12 hours later.      Each maintenance medication was reviewed in detail including most importantly the difference between maintenance and as needed and under what circumstances the prns are to be used.  Please see instructions for details which were reviewed in writing and the patient given a copy.

## 2015-04-02 DIAGNOSIS — E559 Vitamin D deficiency, unspecified: Secondary | ICD-10-CM | POA: Insufficient documentation

## 2015-04-02 DIAGNOSIS — J309 Allergic rhinitis, unspecified: Secondary | ICD-10-CM | POA: Insufficient documentation

## 2015-04-02 DIAGNOSIS — N182 Chronic kidney disease, stage 2 (mild): Secondary | ICD-10-CM

## 2015-04-02 DIAGNOSIS — Z79899 Other long term (current) drug therapy: Secondary | ICD-10-CM | POA: Insufficient documentation

## 2015-04-02 HISTORY — DX: Chronic kidney disease, stage 2 (mild): N18.2

## 2015-04-02 NOTE — Patient Instructions (Signed)
Recommend Adult Low dose Aspirin or baby Aspirin 81 mg daily   To reduce risk of Colon Cancer 20 %,   Skin Cancer 26 % ,   Melanoma 46%   and   Pancreatic cancer 60%  ++++++++++++++++++  Vitamin D goal is between 70-100.   Please make sure that you are taking your Vitamin D as directed.   It is very important as a natural anti-inflammatory   helping hair, skin, and nails, as well as reducing stroke and heart attack risk.   It helps your bones and helps with mood.  It also decreases numerous cancer risks so please take it as directed.   Low Vit D is associated with a 200-300% higher risk for CANCER   and 200-300% higher risk for HEART   ATTACK  &  STROKE.    ......................................  It is also associated with higher death rate at younger ages,   autoimmune diseases like Rheumatoid arthritis, Lupus, Multiple Sclerosis.     Also many other serious conditions, like depression, Alzheimer's  Dementia, infertility, muscle aches, fatigue, fibromyalgia - just to name a few.  +++++++++++++++++++    Recommend the book "The END of DIETING" by Dr Joel Fuhrman   & the book "The END of DIABETES " by Dr Joel Fuhrman  At [employer] - get book & Audio CD's     Being diabetic has a  300% increased risk for heart attack, stroke, cancer, and alzheimer- type vascular dementia. It is very important that you work harder with diet by avoiding all foods that are white. Avoid white rice (brown & wild rice is OK), white potatoes (sweetpotatoes in moderation is OK), White bread or wheat bread or anything made out of white flour like bagels, donuts, rolls, buns, biscuits, cakes, pastries, cookies, pizza crust, and pasta (made from white flour & egg whites) - vegetarian pasta or spinach or wheat pasta is OK. Multigrain breads like Arnold's or [employer], or multigrain sandwich thins or flatbreads.  Diet, exercise and weight loss can reverse and cure diabetes in the early  stages.  Diet, exercise and weight loss is very important in the control and prevention of complications of diabetes which affects every system in your body, ie. Brain - dementia/stroke, eyes - glaucoma/blindness, heart - heart attack/heart failure, kidneys - dialysis, stomach - gastric paralysis, intestines - malabsorption, nerves - severe painful neuritis, circulation - gangrene & loss of a leg(s), and finally cancer and Alzheimers.    I recommend avoid fried & greasy foods,  sweets/candy, white rice (brown or wild rice or Quinoa is OK), white potatoes (sweet potatoes are OK) - anything made from white flour - bagels, doughnuts, rolls, buns, biscuits,white and wheat breads, pizza crust and traditional pasta made of white flour & egg white(vegetarian pasta or spinach or wheat pasta is OK).  Multi-grain bread is OK - like multi-grain flat bread or sandwich thins. Avoid alcohol in excess. Exercise is also important.    Eat all the vegetables you want - avoid meat, especially red meat and dairy - especially cheese.  Cheese is the most concentrated form of trans-fats which is the worst thing to clog up our arteries. Veggie cheese is OK which can be found in the fresh produce section at [employer] or [employer] or [employer]  ++++++++++++++++++++++++++  Preventive Care for Adults  A healthy lifestyle and preventive care can promote health and wellness. Preventive health guidelines for women include the following key practices.  A routine yearly physical is   a good way to check with your health care provider about your health and preventive screening. It is a chance to share any concerns and updates on your health and to receive a thorough exam.  Visit your dentist for a routine exam and preventive care every 6 months. Brush your teeth twice a day and floss once a day. Good oral hygiene prevents tooth decay and gum disease.  The frequency of eye exams is based on your age, health, family medical  history, use of contact lenses, and other factors. Follow your health care provider's recommendations for frequency of eye exams.  Eat a healthy diet. Foods like vegetables, fruits, whole grains, low-fat dairy products, and lean protein foods contain the nutrients you need without too many calories. Decrease your intake of foods high in solid fats, added sugars, and salt. Eat the right amount of calories for you.Get information about a proper diet from your health care provider, if necessary.  Regular physical exercise is one of the most important things you can do for your health. Most adults should get at least 150 minutes of moderate-intensity exercise (any activity that increases your heart rate and causes you to sweat) each week. In addition, most adults need muscle-strengthening exercises on 2 or more days a week.  Maintain a healthy weight. The body mass index (BMI) is a screening tool to identify possible weight problems. It provides an estimate of body fat based on height and weight. Your health care provider can find your BMI and can help you achieve or maintain a healthy weight.For adults 20 years and older:  A BMI below 18.5 is considered underweight.  A BMI of 18.5 to 24.9 is normal.  A BMI of 25 to 29.9 is considered overweight.  A BMI of 30 and above is considered obese.  Maintain normal blood lipids and cholesterol levels by exercising and minimizing your intake of saturated fat. Eat a balanced diet with plenty of fruit and vegetables. Blood tests for lipids and cholesterol should begin at age 69 and be repeated every 5 years. If your lipid or cholesterol levels are high, you are over 50, or you are at high risk for heart disease, you may need your cholesterol levels checked more frequently.Ongoing high lipid and cholesterol levels should be treated with medicines if diet and exercise are not working.  If you smoke, find out from your health care provider how to quit. If you do  not use tobacco, do not start.  Lung cancer screening is recommended for adults aged 53-80 years who are at high risk for developing lung cancer because of a history of smoking. A yearly low-dose CT scan of the lungs is recommended for people who have at least a 30-pack-year history of smoking and are a current smoker or have quit within the past 15 years. A pack year of smoking is smoking an average of 1 pack of cigarettes a day for 1 year (for example: 1 pack a day for 30 years or 2 packs a day for 15 years). Yearly screening should continue until the smoker has stopped smoking for at least 15 years. Yearly screening should be stopped for people who develop a health problem that would prevent them from having lung cancer treatment.  High blood pressure causes heart disease and increases the risk of stroke. Your blood pressure should be checked at least every 1 to 2 years. Ongoing high blood pressure should be treated with medicines if weight loss and exercise do not work.  If you are 61-95 years old, ask your health care provider if you should take aspirin to prevent strokes.  Diabetes screening involves taking a blood sample to check your fasting blood sugar level. This should be done once every 3 years, after age 37, if you are within normal weight and without risk factors for diabetes. Testing should be considered at a younger age or be carried out more frequently if you are overweight and have at least 1 risk factor for diabetes.  Breast cancer screening is essential preventive care for women. You should practice "breast self-awareness." This means understanding the normal appearance and feel of your breasts and may include breast self-examination. Any changes detected, no matter how small, should be reported to a health care provider. Women in their 86s and 30s should have a clinical breast exam (CBE) by a health care provider as part of a regular health exam every 1 to 3 years. After age 91, women  should have a CBE every year. Starting at age 41, women should consider having a mammogram (breast X-ray test) every year. Women who have a family history of breast cancer should talk to their health care provider about genetic screening. Women at a high risk of breast cancer should talk to their health care providers about having an MRI and a mammogram every year.  Breast cancer gene (BRCA)-related cancer risk assessment is recommended for women who have family members with BRCA-related cancers. BRCA-related cancers include breast, ovarian, tubal, and peritoneal cancers. Having family members with these cancers may be associated with an increased risk for harmful changes (mutations) in the breast cancer genes BRCA1 and BRCA2. Results of the assessment will determine the need for genetic counseling and BRCA1 and BRCA2 testing.  Routine pelvic exams to screen for cancer are no longer recommended for nonpregnant women who are considered low risk for cancer of the pelvic organs (ovaries, uterus, and vagina) and who do not have symptoms. Ask your health care provider if a screening pelvic exam is right for you.  If you have had past treatment for cervical cancer or a condition that could lead to cancer, you need Pap tests and screening for cancer for at least 20 years after your treatment. If Pap tests have been discontinued, your risk factors (such as having a new sexual partner) need to be reassessed to determine if screening should be resumed. Some women have medical problems that increase the chance of getting cervical cancer. In these cases, your health care provider may recommend more frequent screening and Pap tests.  Colorectal cancer can be detected and often prevented. Most routine colorectal cancer screening begins at the age of 67 years and continues through age 35 years. However, your health care provider may recommend screening at an earlier age if you have risk factors for colon cancer. On a  yearly basis, your health care provider may provide home test kits to check for hidden blood in the stool. Use of a small camera at the end of a tube, to directly examine the colon (sigmoidoscopy or colonoscopy), can detect the earliest forms of colorectal cancer. Talk to your health care provider about this at age 34, when routine screening begins. Direct exam of the colon should be repeated every 5-10 years through age 90 years, unless early forms of pre-cancerous polyps or small growths are found.  Hepatitis C blood testing is recommended for all people born from [birth year] through 1965 and any individual with known risks for hepatitis C.  Pra  Osteoporosis is a disease in which the bones lose minerals and strength with aging. This can result in serious bone fractures or breaks. The risk of osteoporosis can be identified using a bone density scan. Women ages 54 years and over and women at risk for fractures or osteoporosis should discuss screening with their health care providers. Ask your health care provider whether you should take a calcium supplement or vitamin D to reduce the rate of osteoporosis.  Menopause can be associated with physical symptoms and risks. Hormone replacement therapy is available to decrease symptoms and risks. You should talk to your health care provider about whether hormone replacement therapy is right for you.  Use sunscreen. Apply sunscreen liberally and repeatedly throughout the day. You should seek shade when your shadow is shorter than you. Protect yourself by wearing long sleeves, pants, a wide-brimmed hat, and sunglasses year round, whenever you are outdoors.  Once a month, do a whole body skin exam, using a mirror to look at the skin on your back. Tell your health care provider of new moles, moles that have irregular borders, moles that are larger than a pencil eraser, or moles that have changed in shape or color.  Stay current with required vaccines  (immunizations).  Influenza vaccine. All adults should be immunized every year.  Tetanus, diphtheria, and acellular pertussis (Td, Tdap) vaccine. Pregnant women should receive 1 dose of Tdap vaccine during each pregnancy. The dose should be obtained regardless of the length of time since the last dose. Immunization is preferred during the 27th-36th week of gestation. An adult who has not previously received Tdap or who does not know her vaccine status should receive 1 dose of Tdap. This initial dose should be followed by tetanus and diphtheria toxoids (Td) booster doses every 10 years. Adults with an unknown or incomplete history of completing a 3-dose immunization series with Td-containing vaccines should begin or complete a primary immunization series including a Tdap dose. Adults should receive a Td booster every 10 years.  Varicella vaccine. An adult without evidence of immunity to varicella should receive 2 doses or a second dose if she has previously received 1 dose. Pregnant females who do not have evidence of immunity should receive the first dose after pregnancy. This first dose should be obtained before leaving the health care facility. The second dose should be obtained 4-8 weeks after the first dose.  Human papillomavirus (HPV) vaccine. Females aged 13-26 years who have not received the vaccine previously should obtain the 3-dose series. The vaccine is not recommended for use in pregnant females. However, pregnancy testing is not needed before receiving a dose. If a female is found to be pregnant after receiving a dose, no treatment is needed. In that case, the remaining doses should be delayed until after the pregnancy. Immunization is recommended for any person with an immunocompromised condition through the age of 28 years if she did not get any or all doses earlier. During the 3-dose series, the second dose should be obtained 4-8 weeks after the first dose. The third dose should be obtained  24 weeks after the first dose and 16 weeks after the second dose.  Zoster vaccine. One dose is recommended for adults aged 27 years or older unless certain conditions are present.  Measles, mumps, and rubella (MMR) vaccine. Adults born before [birth year] generally are considered immune to measles and mumps. Adults born in [birth year] or later should have 1 or more doses of MMR vaccine unless there is a  contraindication to the vaccine or there is laboratory evidence of immunity to each of the three diseases. A routine second dose of MMR vaccine should be obtained at least 28 days after the first dose for students attending postsecondary schools, health care workers, or international travelers. People who received inactivated measles vaccine or an unknown type of measles vaccine during 1963-1967 should receive 2 doses of MMR vaccine. People who received inactivated mumps vaccine or an unknown type of mumps vaccine before 1979 and are at high risk for mumps infection should consider immunization with 2 doses of MMR vaccine. For females of childbearing age, rubella immunity should be determined. If there is no evidence of immunity, females who are not pregnant should be vaccinated. If there is no evidence of immunity, females who are pregnant should delay immunization until after pregnancy. Unvaccinated health care workers born before [birth year] who lack laboratory evidence of measles, mumps, or rubella immunity or laboratory confirmation of disease should consider measles and mumps immunization with 2 doses of MMR vaccine or rubella immunization with 1 dose of MMR vaccine.  Pneumococcal 13-valent conjugate (PCV13) vaccine. When indicated, a person who is uncertain of her immunization history and has no record of immunization should receive the PCV13 vaccine. An adult aged 3 years or older who has certain medical conditions and has not been previously immunized should receive 1 dose of PCV13 vaccine. This PCV13 should be followed  with a dose of pneumococcal polysaccharide (PPSV23) vaccine. The PPSV23 vaccine dose should be obtained at least 8 weeks after the dose of PCV13 vaccine. An adult aged 49 years or older who has certain medical conditions and previously received 1 or more doses of PPSV23 vaccine should receive 1 dose of PCV13. The PCV13 vaccine dose should be obtained 1 or more years after the last PPSV23 vaccine dose.    Pneumococcal polysaccharide (PPSV23) vaccine. When PCV13 is also indicated, PCV13 should be obtained first. All adults aged 81 years and older should be immunized. An adult younger than age 19 years who has certain medical conditions should be immunized. Any person who resides in a nursing home or long-term care facility should be immunized. An adult smoker should be immunized. People with an immunocompromised condition and certain other conditions should receive both PCV13 and PPSV23 vaccines. People with human immunodeficiency virus (HIV) infection should be immunized as soon as possible after diagnosis. Immunization during chemotherapy or radiation therapy should be avoided. Routine use of PPSV23 vaccine is not recommended for American Indians, [place] Natives, or people younger than 65 years unless there are medical conditions that require PPSV23 vaccine. When indicated, people who have unknown immunization and have no record of immunization should receive PPSV23 vaccine. One-time revaccination 5 years after the first dose of PPSV23 is recommended for people aged 19-64 years who have chronic kidney failure, nephrotic syndrome, asplenia, or immunocompromised conditions. People who received 1-2 doses of PPSV23 before age 50 years should receive another dose of PPSV23 vaccine at age 57 years or later if at least 5 years have passed since the previous dose. Doses of PPSV23 are not needed for people immunized with PPSV23 at or after age 48 years.  Preventive Services / Frequency   Ages 72 to 22 years  Blood  pressure check.  Lipid and cholesterol check.  Lung cancer screening. / Every year if you are aged 50-80 years and have a 30-pack-year history of smoking and currently smoke or have quit within the past 15 years. Yearly screening is stopped  once you have quit smoking for at least 15 years or develop a health problem that would prevent you from having lung cancer treatment.  Clinical breast exam.** / Every year after age 33 years.  BRCA-related cancer risk assessment.** / For women who have family members with a BRCA-related cancer (breast, ovarian, tubal, or peritoneal cancers).  Mammogram.** / Every year beginning at age 49 years and continuing for as long as you are in good health. Consult with your health care provider.  Pap test.** / Every 3 years starting at age 28 years through age 18 or 69 years with a history of 3 consecutive normal Pap tests.  HPV screening.** / Every 3 years from ages 65 years through ages 59 to 78 years with a history of 3 consecutive normal Pap tests.  Fecal occult blood test (FOBT) of stool. / Every year beginning at age 2 years and continuing until age 41 years. You may not need to do this test if you get a colonoscopy every 10 years.  Flexible sigmoidoscopy or colonoscopy.** / Every 5 years for a flexible sigmoidoscopy or every 10 years for a colonoscopy beginning at age 23 years and continuing until age 62 years.  Hepatitis C blood test.** / For all people born from [birth year] through 1965 and any individual with known risks for hepatitis C.  Skin self-exam. / Monthly.  Influenza vaccine. / Every year.  Tetanus, diphtheria, and acellular pertussis (Tdap/Td) vaccine.** / Consult your health care provider. Pregnant women should receive 1 dose of Tdap vaccine during each pregnancy. 1 dose of Td every 10 years.  Varicella vaccine.** / Consult your health care provider. Pregnant females who do not have evidence of immunity should receive the first dose after  pregnancy.  Zoster vaccine.** / 1 dose for adults aged 21 years or older.  Pneumococcal 13-valent conjugate (PCV13) vaccine.** / Consult your health care provider.  Pneumococcal polysaccharide (PPSV23) vaccine.** / 1 to 2 doses if you smoke cigarettes or if you have certain conditions.  Meningococcal vaccine.** / Consult your health care provider.  Hepatitis A vaccine.** / Consult your health care provider.  Hepatitis B vaccine.** / Consult your health care provider. Screening for abdominal aortic aneurysm (AAA)  by ultrasound is recommended for people over 50 who have history of high blood pressure or who are current or former smokers.

## 2015-04-02 NOTE — Progress Notes (Signed)
Patient ID: Nicole Waller, female   DOB: [birth year], 49 y.o.   MRN: [id number]  Annual Comprehensive Examination  This very nice 49 y.o. MWF presents for complete physical.  Patient has been followed and monitored expectantly for elevated BP, lipids, glucose and Vitamin D Deficiency.    In June 2015 , patient sustained a headinjury being kicked by a horse and was felt to have a concussion. 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 Problems that she experienced include impaired memory recall, anomia, dyscalculia, Rt<->Lt disorientation,  spatial disorientation and also some difficulties with distractibility and  inability to focus and concentrate. Sx's have improved, but she still feels she occasionally has some of the afore mentioned difficulties. Other problems include hx/o migraines controlled with medications. Patient is also followed by Dr Melvyn Novas for hx/o asthma.     Patient is screened for elevated BP.  Patient's BP has been controlled at previous visits and patient denies any cardiac symptoms as chest pain, palpitations, shortness of breath, dizziness or ankle swelling. Today's BP: 118/68 mmHg     Patient's lipids are ontrolled with diet. Patient denies myalgias or other medication SE's. Last lipids were at goal - Total Chol 172, HDL 75, LDL 79 & Trig 88 in May 2015.    Patient  Is monitored for abnormal glucose with hx/o elevated glucose 115 in the past - presumed fasting and patient denies reactive hypoglycemic symptoms, visual blurring, diabetic polys, or paresthesias. Last A1c was 5.3% in May 2015.     Finally, patient has history of Vitamin D Deficiency and last Vitamin D was 71 on May 2015.      Medication Sig  . albuterol  HFA  inhaler 2 puffs every 4 hours as needed only  if your can't catch your breath  . ALPRAZolam  (XANAX) 0.5 MG tablet Take 0.25 mg by mouth daily as needed for anxiety.  . B Complex Vitamins (B COMPLEX-B12) TABS Take 1 tablet by mouth daily.  . beclomethasone (QVAR) 80 MCG/ACT inhaler 2 puffs twice daily  . Calcium 600-200 MG-UNIT per tablet Take 1 tablet by mouth daily.    . cetirizine (ZYRTEC) 10 MG tablet Take 10 mg by mouth daily.  . cholecalciferol (VITAMIN D) 1000 UNITS tablet 1 tablet daily  . IMITREX 100 MG tablet TAKE 1 TAB stat for migraine & may repeat 1 time in 2 hours  . IMITREX 20 MG/ACT nasal spray  1 SPRAY nasal & may repeat 1 time in 2 hours  . MAGNESIUM GLYCINATE PLUS PO Take 400 mg by mouth 2 (two) times daily.  . montelukast (SINGULAIR) 10 MG tablet TAKE 1 TABLET BY MOUTH EVERY DAY  . Multiple Vitamin (MULTIVITAMIN) capsule Take 1 capsule by mouth daily.    Marland Kitchen LO/OVRAL Take 1 tablet by mouth daily.    . solifenacin (VESICARE) 5 MG tablet Take 10 mg by mouth daily as needed (take when travelling).   . valACYclovir (VALTREX) 500 MG tablet Take 500 mg by mouth daily as needed (for outbreak).   Marland Kitchen zolpidem (AMBIEN) 10 MG tablet Take 10 mg by mouth at bedtime as needed for sleep.    Allergies  Allergen Reactions  . Accolate [Zafirlukast]     GI Upset  . Codeine     REACTION: Reaction not known  . Lyrica [Pregabalin]  Fatigue  . Oxycodone   . Probiotic [Acidophilus]     Headache   Past Medical History  Diagnosis Date  . Dyspnea   . Abnormal EKG   . Abnormal chest CT   . Abdominal pain   . Chronic headache   . Chronic cough   . Anxiety   . Hx of migraines   . Rosacea   . Pulmonary nodule 2011  . History of nephrolithiasis    Health Maintenance  Topic Date Due  . HIV Screening  10/21/1981  . PAP SMEAR  10/21/1984  . INFLUENZA VACCINE  06/12/2015  . TETANUS/TDAP  11/11/2016   Immunization History  Administered Date(s) Administered  . Influenza Split 08/11/2014  . Influenza Whole 08/11/2012  . Pneumococcal-Unspecified 11/11/2006  . Td 11/11/2006    Past Surgical History  Procedure Laterality Date  . Shoulder surgery    . Foot surgery    . Elbow surgery     Family History  Problem Relation Age of Onset  . Breast cancer Maternal Grandmother   . Cancer Maternal Grandmother 62    Breast,uterine,ovarian  . Colon polyps Mother   . Diabetes Mother   . Rheum arthritis Mother   . Lupus Mother   . Colon polyps Father   . Colitis Father   . Heart disease Father   . Hypertension Father   . Hyperlipidemia Father   . Diabetes Paternal Grandfather    History  Substance Use Topics  . Smoking status: Never Smoker   . Smokeless tobacco: Never Used  . Alcohol Use: No    ROS Constitutional: Denies fever, chills, weight loss/gain, headaches, insomnia,  night sweats, and change in appetite. Does c/o fatigue. Eyes: Denies redness, blurred vision, diplopia, discharge, itchy, watery eyes.  ENT: Denies discharge, congestion, post nasal drip, epistaxis, sore throat, earache, hearing loss, dental pain, Tinnitus, Vertigo, Sinus pain, snoring.  Cardio: Denies chest pain, palpitations, irregular heartbeat, syncope, dyspnea, diaphoresis, orthopnea, PND, claudication, edema Respiratory: denies cough, dyspnea, DOE, pleurisy, hoarseness, laryngitis, wheezing.  Gastrointestinal: Denies dysphagia, heartburn, reflux, water brash, pain, cramps, nausea, vomiting, bloating, diarrhea, constipation, hematemesis, melena, hematochezia, jaundice, hemorrhoids Genitourinary: Denies dysuria, frequency, urgency, nocturia, hesitancy, discharge, hematuria, flank pain Breast: Breast lumps, nipple discharge, bleeding.  Musculoskeletal: Denies arthralgia, myalgia, stiffness, Jt. Swelling, pain, limp, and strain/sprain. Denies falls. Skin: Denies puritis, rash, hives, warts, acne, eczema, changing in skin lesion Neuro: No weakness, tremor, incoordination, spasms, paresthesia, pain Psychiatric: Denies confusion, memory loss, sensory loss. Denies Depression. Endocrine:  Denies change in weight, skin, hair change, nocturia, and paresthesia, diabetic polys, visual blurring, hyper / hypo glycemic episodes.  Heme/Lymph: No excessive bleeding, bruising, enlarged lymph nodes.  Physical Exam  BP 118/68   Pulse 68  Temp 98.8 F   Resp 16  Ht 5' 8.5"   Wt 163 lb 3.2 oz     BMI 24.45   General Appearance: Well nourished and in no apparent distress. Eyes: PERRLA, EOMs, conjunctiva no swelling or erythema, normal fundi and vessels. Sinuses: No frontal/maxillary tenderness ENT/Mouth: EACs patent / TMs  nl. Nares clear without erythema, swelling, mucoid exudates. Oral hygiene is good. No erythema, swelling, or exudate. Tongue normal, non-obstructing. Tonsils not swollen or erythematous. Hearing normal.  Neck: Supple, thyroid normal. No bruits, nodes or JVD. Respiratory: Respiratory effort normal.  BS equal and clear bilateral without rales, rhonci, wheezing or stridor. Cardio: Heart sounds are normal with regular rate and rhythm and no murmurs, rubs or gallops. Peripheral pulses are normal and equal bilaterally  without edema. No aortic or femoral bruits. Chest: symmetric with normal excursions and percussion. Breasts: Symmetric, without lumps, nipple discharge, retractions, or fibrocystic changes.  Abdomen: Flat, soft, with bowl sounds. Nontender, no guarding, rebound, hernias, masses, or organomegaly.  Lymphatics: Non tender without lymphadenopathy.  Genitourinary:  Musculoskeletal: Full ROM all peripheral extremities, joint stability, 5/5 strength, and normal gait. Skin: Warm and dry without rashes, lesions, cyanosis, clubbing or  ecchymosis.  Neuro: Cranial nerves intact, reflexes equal bilaterally. Normal muscle tone, no cerebellar symptoms. Sensation intact.  Pysch: Awake and oriented X 3, normal affect, Insight and Judgment appropriate.   Assessment and Plan  1. Elevated BP   2. Elevated cholesterol  - Lipid panel  3. Abnormal glucose  - Hemoglobin  A1c - Insulin, random  4. Vitamin D deficiency  - Vit D  25 hydroxy   5. Screening for colorectal cancer  - POC Hemoccult Bld/Stl   6. Nonspecific abnormal electrocardiogram   - EKG 12-Lead  7. Other fatigue  - Vitamin B12 - Iron and TIBC - TSH  8. s/p Traumatic Brain Injury -   - Patient was offered a trial with ADD meds and she's contemplating a trial  9. Medication management  - Urine Microscopic - CBC with Differential/Platelet - BASIC METABOLIC PANEL WITH GFR - Hepatic function panel - Magnesium   Continue prudent diet as discussed, weight control, BP monitoring, regular exercise, and medications. Discussed med's effects and SE's. Screening labs and tests as requested with regular follow-up as recommended.  Over 40 minutes of exam, counseling, chart review was performed.

## 2015-04-03 ENCOUNTER — Encounter: Payer: Self-pay | Admitting: Internal Medicine

## 2015-04-03 ENCOUNTER — Ambulatory Visit (INDEPENDENT_AMBULATORY_CARE_PROVIDER_SITE_OTHER): Payer: 59 | Admitting: Internal Medicine

## 2015-04-03 VITALS — BP 118/68 | HR 68 | Temp 98.8°F | Resp 16 | Ht 68.5 in | Wt 163.2 lb

## 2015-04-03 DIAGNOSIS — R030 Elevated blood-pressure reading, without diagnosis of hypertension: Secondary | ICD-10-CM

## 2015-04-03 DIAGNOSIS — Z1212 Encounter for screening for malignant neoplasm of rectum: Secondary | ICD-10-CM

## 2015-04-03 DIAGNOSIS — R5383 Other fatigue: Secondary | ICD-10-CM

## 2015-04-03 DIAGNOSIS — S069X0S Unspecified intracranial injury without loss of consciousness, sequela: Secondary | ICD-10-CM

## 2015-04-03 DIAGNOSIS — Z79899 Other long term (current) drug therapy: Secondary | ICD-10-CM

## 2015-04-03 DIAGNOSIS — E7800 Pure hypercholesterolemia, unspecified: Secondary | ICD-10-CM

## 2015-04-03 DIAGNOSIS — Z1211 Encounter for screening for malignant neoplasm of colon: Secondary | ICD-10-CM

## 2015-04-03 DIAGNOSIS — S069XAA Unspecified intracranial injury with loss of consciousness status unknown, initial encounter: Secondary | ICD-10-CM | POA: Insufficient documentation

## 2015-04-03 DIAGNOSIS — R7309 Other abnormal glucose: Secondary | ICD-10-CM

## 2015-04-03 DIAGNOSIS — S069X9A Unspecified intracranial injury with loss of consciousness of unspecified duration, initial encounter: Secondary | ICD-10-CM | POA: Insufficient documentation

## 2015-04-03 DIAGNOSIS — R9431 Abnormal electrocardiogram [ECG] [EKG]: Secondary | ICD-10-CM

## 2015-04-03 DIAGNOSIS — IMO0001 Reserved for inherently not codable concepts without codable children: Secondary | ICD-10-CM

## 2015-04-03 DIAGNOSIS — E559 Vitamin D deficiency, unspecified: Secondary | ICD-10-CM

## 2015-04-03 LAB — HEPATIC FUNCTION PANEL
ALK PHOS: 73 U/L (ref 39–117)
ALT: 19 U/L (ref 0–35)
AST: 19 U/L (ref 0–37)
Albumin: 4 g/dL (ref 3.5–5.2)
BILIRUBIN INDIRECT: 0.3 mg/dL (ref 0.2–1.2)
Bilirubin, Direct: 0.1 mg/dL (ref 0.0–0.3)
Total Bilirubin: 0.4 mg/dL (ref 0.2–1.2)
Total Protein: 6.6 g/dL (ref 6.0–8.3)

## 2015-04-03 LAB — CBC WITH DIFFERENTIAL/PLATELET
BASOS PCT: 1 % (ref 0–1)
Basophils Absolute: 0.1 10*3/uL (ref 0.0–0.1)
EOS ABS: 0.1 10*3/uL (ref 0.0–0.7)
Eosinophils Relative: 1 % (ref 0–5)
HEMATOCRIT: 40.2 % (ref 36.0–46.0)
HEMOGLOBIN: 13.8 g/dL (ref 12.0–15.0)
LYMPHS PCT: 18 % (ref 12–46)
Lymphs Abs: 1.3 10*3/uL (ref 0.7–4.0)
MCH: 34.3 pg — ABNORMAL HIGH (ref 26.0–34.0)
MCHC: 34.3 g/dL (ref 30.0–36.0)
MCV: 100 fL (ref 78.0–100.0)
MONOS PCT: 4 % (ref 3–12)
MPV: 10.1 fL (ref 8.6–12.4)
Monocytes Absolute: 0.3 10*3/uL (ref 0.1–1.0)
NEUTROS ABS: 5.5 10*3/uL (ref 1.7–7.7)
NEUTROS PCT: 76 % (ref 43–77)
Platelets: 301 10*3/uL (ref 150–400)
RBC: 4.02 MIL/uL (ref 3.87–5.11)
RDW: 13.8 % (ref 11.5–15.5)
WBC: 7.3 10*3/uL (ref 4.0–10.5)

## 2015-04-03 LAB — LIPID PANEL
CHOL/HDL RATIO: 2.4 ratio
Cholesterol: 154 mg/dL (ref 0–200)
HDL: 65 mg/dL (ref >=46)
LDL CALC: 65 mg/dL (ref 0–99)
Triglycerides: 121 mg/dL (ref <150)
VLDL: 24 mg/dL (ref 0–40)

## 2015-04-03 LAB — TSH: TSH: 2.296 u[IU]/mL (ref 0.350–4.500)

## 2015-04-03 LAB — MAGNESIUM: MAGNESIUM: 2.1 mg/dL (ref 1.5–2.5)

## 2015-04-03 LAB — VITAMIN B12: VITAMIN B 12: 374 pg/mL (ref 211–911)

## 2015-04-03 LAB — BASIC METABOLIC PANEL WITH GFR
BUN: 14 mg/dL (ref 6–23)
CO2: 24 mEq/L (ref 19–32)
Calcium: 8.9 mg/dL (ref 8.4–10.5)
Chloride: 106 mEq/L (ref 96–112)
Creat: 0.98 mg/dL (ref 0.50–1.10)
GFR, EST AFRICAN AMERICAN: 79 mL/min
GFR, EST NON AFRICAN AMERICAN: 68 mL/min
GLUCOSE: 89 mg/dL (ref 70–99)
Potassium: 4.3 mEq/L (ref 3.5–5.3)
Sodium: 140 mEq/L (ref 135–145)

## 2015-04-03 LAB — HEMOGLOBIN A1C
HEMOGLOBIN A1C: 5.5 % (ref <5.7)
Mean Plasma Glucose: 111 mg/dL (ref <117)

## 2015-04-03 LAB — IRON AND TIBC
%SAT: 36 % (ref 20–55)
Iron: 136 ug/dL (ref 42–145)
TIBC: 376 ug/dL (ref 250–470)
UIBC: 240 ug/dL (ref 125–400)

## 2015-04-04 LAB — INSULIN, RANDOM: Insulin: 8.5 u[IU]/mL (ref 2.0–19.6)

## 2015-04-04 LAB — URINALYSIS, MICROSCOPIC ONLY
BACTERIA UA: NONE SEEN
Casts: NONE SEEN
Crystals: NONE SEEN
SQUAMOUS EPITHELIAL / LPF: NONE SEEN

## 2015-04-04 LAB — VITAMIN D 25 HYDROXY (VIT D DEFICIENCY, FRACTURES): VIT D 25 HYDROXY: 49 ng/mL (ref 30–100)

## 2015-04-09 ENCOUNTER — Other Ambulatory Visit: Payer: Self-pay | Admitting: Physician Assistant

## 2015-05-06 ENCOUNTER — Other Ambulatory Visit: Payer: Self-pay | Admitting: Physician Assistant

## 2015-06-13 ENCOUNTER — Ambulatory Visit: Payer: 59 | Admitting: Occupational Therapy

## 2015-06-13 ENCOUNTER — Ambulatory Visit: Payer: 59 | Attending: Internal Medicine | Admitting: Occupational Therapy

## 2015-06-13 DIAGNOSIS — R4189 Other symptoms and signs involving cognitive functions and awareness: Secondary | ICD-10-CM | POA: Diagnosis not present

## 2015-06-13 DIAGNOSIS — H539 Unspecified visual disturbance: Secondary | ICD-10-CM | POA: Diagnosis not present

## 2015-06-13 DIAGNOSIS — X58XXXS Exposure to other specified factors, sequela: Secondary | ICD-10-CM | POA: Insufficient documentation

## 2015-06-13 DIAGNOSIS — S069X0S Unspecified intracranial injury without loss of consciousness, sequela: Secondary | ICD-10-CM | POA: Diagnosis not present

## 2015-06-13 DIAGNOSIS — F068 Other specified mental disorders due to known physiological condition: Secondary | ICD-10-CM

## 2015-06-13 DIAGNOSIS — S0990XS Unspecified injury of head, sequela: Secondary | ICD-10-CM

## 2015-06-14 NOTE — Therapy (Signed)
[facility] [address] [place], [place], [address] Phone: [phone]   Fax:  [phone]  Occupational Therapy Evaluation  Patient Details  Name: Nicole Waller MRN: [id number] Date of Birth: 28-May-1966 Referring Provider:  Rachel Moulds, DO  Encounter Date: 06/13/2015      OT End of Session - 06/14/15 0901    Visit Number 1   Number of Visits 16   Date for OT Re-Evaluation 08/11/15   Authorization Type U[employer]   OT Start Time 1535   OT Stop Time 1625   OT Time Calculation (min) 50 min   Activity Tolerance Patient tolerated treatment well   Behavior During Therapy [facility] for tasks assessed/performed      Past Medical History  Diagnosis Date  . Dyspnea   . Abnormal EKG   . Abnormal chest CT   . Abdominal pain   . Chronic headache   . Chronic cough   . Anxiety   . Hx of migraines   . Rosacea   . Pulmonary nodule 2011  . History of nephrolithiasis     Past Surgical History  Procedure Laterality Date  . Shoulder surgery    . Foot surgery    . Elbow surgery      There were no vitals filed for this visit.  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 No/denies   Multiple Pain Sites No           [facility] OT Assessment - 06/14/15 0001    Assessment   Diagnosis TBI   Onset Date --  June 2014   Assessment Pt sustained a head injury when she was bumped by a turning horse.   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   Mobility   Mobility Status Independent   Written Expression   Handwriting 100% legible   Vision - History   Baseline Vision Wears contact  readers for near vision   Vision Assessment   Eye Alignment Impaired (comment)   Vision Assessment Vision impaired  _ to be further tested in functional context   Ocular Range of Motion Within Functional Limits   Diplopia Assessment Only with left gaze  mild shadowing   Comment blurry vision with fatigue   Cognition   Overall Cognitive Status  Impaired/Different from baseline   Mini Mental State Exam  symbols digit modalities test: 100% accuracy 3.52 mins   Area of Impairment Memory;Attention   Current Attention Level Selective   Attention Comments impaired alternating/ divided  mod v.c. for category generation wheil amb and toss ball   Memory Decreased short-term memory   Memory Comments MOCHA: 27/30, recalls 3/5 words following delay, difficulty counting backwards by 7's   Attention Selective   Memory Impaired   Memory Impairment Decreased recall of new information;Decreased short term memory   Decreased Short Term Memory --  Does not understand notes she has written,    Awareness Appears intact   Sports administrator Impaired   Organizing Impairment  --  Pt reports difficulty organizing/locating her files at work   Coordination   Coordination denies changes to coordination   AROM   Overall AROM  Within functional limits for tasks performed   Strength   Overall Strength Within functional limits for tasks performed                           OT Short Term Goals - 06/14/15 0906    OT [place] #1   Title discuss neuropsych testing with pt. and set cognitive goals following testing.   Time 4   Period Weeks   Status New           OT Long Term Goals - 06/14/15 3419    OT LONG TERM GOAL #1   Title I with HEP for vision.   Time 8   Period Weeks   Status New   OT LONG TERM GOAL #2   Title Pt will verbalize understanding of compensatory strategies for short term memory deficits   Time 8   Period Weeks   Status New               Plan - 06/14/15 0902    Clinical Impression Statement Pt s/p TBI June 2014 presents with cognitive and visual impairments which impede performance of ADLS/IADLS. Pt can benefit from skilled occupational therapy.   Pt will benefit from skilled therapeutic intervention in order to improve on the following deficits (Retired) Decreased cognition;Impaired vision/preception   Rehab Potential Fair   Clinical Impairments Affecting Rehab Potential short term memory   OT Frequency 2x / week  8 visits plus eval   OT Duration 8 weeks   OT Treatment/Interventions Visual/perceptual remediation/compensation;Cognitive remediation/compensation;Therapeutic activities;Therapeutic exercise;Patient/family education;Self-care/ADL training   Plan compensatory strategies for short term memory deficits, discuss neuropsych testing with pt.   Consulted and Agree with Plan of Care Patient        Problem List Patient Active Problem List   Diagnosis Date Noted  . TBI (traumatic brain injury) 04/03/2015  . Vitamin D deficiency 04/02/2015  . Medication management 04/02/2015  . 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 Allergic rhinitis 04/02/2015  . Anxiety   . Hx of migraines   . Rosacea   . History of nephrolithiasis   . Cough variant asthma 12/19/2010  . PULMONARY NODULE 06/27/2010  . EPIGASTRIC PAIN 12/25/2009  . ABNORMAL EKG 05/10/2009    RINE,KATHRYN 06/14/2015, 9:09 AM Theone Murdoch, OTR/L [phone] Phone: [phone] 9:09 AM 06/14/2015 [facility] [address] [place], [place], [address] Phone: [phone]   Fax:  [phone]

## 2015-06-16 ENCOUNTER — Telehealth: Payer: Self-pay | Admitting: Occupational Therapy

## 2015-06-16 ENCOUNTER — Encounter: Payer: 59 | Admitting: Occupational Therapy

## 2015-06-16 NOTE — Telephone Encounter (Signed)
Dr. Johnnye Sima,   Waymon Amato may benefit from neuropsych testing to further assess cognitive deficits.. I have discussed with Ludia and she is agreeable.  If you are in agreement, please send a referral for neuro- psych testing  to Marlane Hatcher, neuropsychologist with [facility]. (please inculde clinical documentation and demographic info)  Her fax: [phone](Att. Omelia Blackwater) phone; [phone] ext [phone]  Thanks, Theone Murdoch, OTR/L

## 2015-06-16 NOTE — Addendum Note (Signed)
Addended by: Theone Murdoch B on: 06/16/2015 11:47 AM   Modules accepted: Orders

## 2015-06-16 NOTE — Addendum Note (Signed)
Addended by: Theone Murdoch B on: 06/16/2015 12:58 PM   Modules accepted: Orders

## 2015-06-20 ENCOUNTER — Encounter: Payer: 59 | Admitting: Occupational Therapy

## 2015-06-22 ENCOUNTER — Encounter: Payer: 59 | Admitting: Occupational Therapy

## 2015-07-03 ENCOUNTER — Encounter: Payer: 59 | Admitting: Occupational Therapy

## 2015-07-07 ENCOUNTER — Encounter: Payer: 59 | Admitting: Occupational Therapy

## 2015-07-10 ENCOUNTER — Encounter: Payer: 59 | Admitting: Occupational Therapy

## 2015-07-12 ENCOUNTER — Encounter: Payer: Self-pay | Admitting: Physician Assistant

## 2015-07-12 ENCOUNTER — Ambulatory Visit (INDEPENDENT_AMBULATORY_CARE_PROVIDER_SITE_OTHER): Payer: 59 | Admitting: Physician Assistant

## 2015-07-12 VITALS — BP 120/76 | HR 72 | Temp 97.7°F | Resp 16 | Ht 68.5 in | Wt 158.2 lb

## 2015-07-12 DIAGNOSIS — J029 Acute pharyngitis, unspecified: Secondary | ICD-10-CM

## 2015-07-12 MED ORDER — PREDNISONE 20 MG PO TABS: ORAL_TABLET | ORAL | Status: DC

## 2015-07-12 MED ORDER — FLUCONAZOLE 150 MG PO TABS: 150.0000 mg | ORAL_TABLET | Freq: Every day | ORAL | Status: DC

## 2015-07-12 MED ORDER — AZITHROMYCIN 250 MG PO TABS: ORAL_TABLET | ORAL | Status: AC

## 2015-07-12 MED ORDER — PROMETHAZINE-DM 6.25-15 MG/5ML PO SYRP: ORAL_SOLUTION | ORAL | Status: DC

## 2015-07-12 NOTE — Patient Instructions (Signed)
Please take the prednisone to help decrease inflammation and therefore decrease symptoms. Take it it with food to avoid GI upset. It can cause increased energy but on the other hand it can make it hard to sleep at night so please take it AT NIGHT WITH DINNER, it takes 8-12 hours to start working so it will NOT affect your sleeping if you take it at night with your food!!  If you are diabetic it will increase your sugars so decrease carbs and monitor your sugars closely.     -Make sure you are drinking plenty of fluids to stay hydrated.  -while drinking fluids pinch and hold nose close and swallow, to help open eustachian tubes and drain fluid behind ear drums. -you can do salt water gargles. You can also do 1 TSP liquid Maalox and 1 TSP liquid benadryl- mix/ gargle/ spit  If you are not feeling better in 10-14 days, then please call the office.  Pharyngitis Pharyngitis is redness, pain, and swelling (inflammation) of your pharynx.  CAUSES  Pharyngitis is usually caused by infection. Most of the time, these infections are from viruses (viral) and are part of a cold. However, sometimes pharyngitis is caused by bacteria (bacterial). Pharyngitis can also be caused by allergies. Viral pharyngitis may be spread from person to person by coughing, sneezing, and personal items or utensils (cups, forks, spoons, toothbrushes). Bacterial pharyngitis may be spread from person to person by more intimate contact, such as kissing.  SIGNS AND SYMPTOMS  Symptoms of pharyngitis include:   Sore throat.   Tiredness (fatigue).   Low-grade fever.   Headache.  Joint pain and muscle aches.  Skin rashes.  Swollen lymph nodes.  Plaque-like film on throat or tonsils (often seen with bacterial pharyngitis). DIAGNOSIS  Your health care provider will ask you questions about your illness and your symptoms. Your medical history, along with a physical exam, is often all that is needed to diagnose  pharyngitis. Sometimes, a rapid strep test is done. Other lab tests may also be done, depending on the suspected cause.  TREATMENT  Viral pharyngitis will usually get better in 3-4 days without the use of medicine. Bacterial pharyngitis is treated with medicines that kill germs (antibiotics).  HOME CARE INSTRUCTIONS   Drink enough water and fluids to keep your urine clear or pale yellow.   Only take over-the-counter or prescription medicines as directed by your health care provider:   If you are prescribed antibiotics, make sure you finish them even if you start to feel better.   Do not take aspirin.   Get lots of rest.   Gargle with 8 oz of salt water ( tsp of salt per 1 qt of water) as often as every 1-2 hours to soothe your throat.   Throat lozenges (if you are not at risk for choking) or sprays may be used to soothe your throat. SEEK MEDICAL CARE IF:   You have large, tender lumps in your neck.  You have a rash.  You cough up green, yellow-brown, or bloody spit. SEEK IMMEDIATE MEDICAL CARE IF:   Your neck becomes stiff.  You drool or are unable to swallow liquids.  You vomit or are unable to keep medicines or liquids down.  You have severe pain that does not go away with the use of recommended medicines.  You have trouble breathing (not caused by a stuffy nose). MAKE SURE YOU:   Understand these instructions.  Will watch your condition.  Will get help right   away if you are not doing well or get worse. Document Released: 10/28/2005 Document Revised: 08/18/2013 Document Reviewed: 07/05/2013 ExitCare Patient Information 2015 [employer]. This information is not intended to replace advice given to you by your health care provider. Make sure you discuss any questions you have with your health care provider.  

## 2015-07-12 NOTE — Progress Notes (Signed)
Subjective:    Patient ID: Nicole Waller, female    DOB: 15-Feb-1966, 49 y.o.   MRN: [id number]  HPI 49 y.o. nonsmoking WF with history of CKD stage 2, TBI, asthma presents with sore throat, sinus pressure with yellow mucus, chills, cough with some wheezing and SOB. She is on singulair, zyrtec, QVAR BID and has not had to use her rescue inhaler yet.   Blood pressure 120/76, pulse 72, temperature 97.7 F (36.5 C), resp. rate 16, height 5' 8.5" (1.74 m), weight 158 lb 3.2 oz (71.759 kg).  Current Outpatient Prescriptions on File Prior to Visit  Medication Sig Dispense Refill  . albuterol (PROAIR HFA) 108 (90 BASE) MCG/ACT inhaler 2 puffs every 4 hours as needed only  if your can't catch your breath 1 Inhaler 1  . ALPRAZolam (XANAX) 0.5 MG tablet Take 0.25 mg by mouth daily as needed for anxiety.    Marland Kitchen amphetamine-dextroamphetamine (ADDERALL) 10 MG tablet Take 10 mg by mouth daily with breakfast.    . B Complex Vitamins (B COMPLEX-B12) TABS Take 1 tablet by mouth daily.    . beclomethasone (QVAR) 80 MCG/ACT inhaler 2 puffs twice daily 8.7 g 11  . Calcium 600-200 MG-UNIT per tablet Take 1 tablet by mouth daily.      . cetirizine (ZYRTEC) 10 MG tablet Take 10 mg by mouth daily.    . cholecalciferol (VITAMIN D) 1000 UNITS tablet 1 tablet daily    . IMITREX 100 MG tablet Take 1 tablet stat for Migraine & may repeat 1 x in 2 hours (Maximum 2 tabs /24 hours) 9 tablet 99  . IMITREX 20 MG/ACT nasal spray PLACE 1 SPRAY (20 MG TOTAL) INTO THE NOSE EVERY 2 (TWO) HOURS AS NEEDED FOR MIGRAINE. 6 Act 99  . MAGNESIUM GLYCINATE PLUS PO Take 400 mg by mouth 2 (two) times daily.    . montelukast (SINGULAIR) 10 MG tablet TAKE 1 TABLET BY MOUTH EVERY DAY 30 tablet 99  . Multiple Vitamin (MULTIVITAMIN) capsule Take 1 capsule by mouth daily.      . norgestrel-ethinyl estradiol (LO/OVRAL) 0.3-30 MG-MCG per tablet Take 1 tablet by mouth daily.      . valACYclovir (VALTREX) 1000 MG tablet     . valACYclovir  (VALTREX) 500 MG tablet Take 500 mg by mouth daily as needed (for outbreak).      No current facility-administered medications on file prior to visit.   Past Medical History  Diagnosis Date  . Dyspnea   . Abnormal EKG   . Abnormal chest CT   . Abdominal pain   . Chronic headache   . Chronic cough   . Anxiety   . Hx of migraines   . Rosacea   . Pulmonary nodule 2011  . History of nephrolithiasis     Review of Systems  Constitutional: Positive for chills. Negative for diaphoresis.  HENT: Positive for congestion, ear pain, sinus pressure and sore throat. Negative for sneezing.   Respiratory: Negative.  Negative for cough and shortness of breath.   Cardiovascular: Negative.   Musculoskeletal: Positive for neck pain.  Neurological: Negative.  Negative for headaches.       Objective:   Physical Exam  Constitutional: She appears well-developed and well-nourished.  HENT:  Head: Normocephalic and atraumatic.  Right Ear: External ear normal.  Left Ear: External ear normal.  Mouth/Throat: Uvula is midline and mucous membranes are normal. Posterior oropharyngeal edema and posterior oropharyngeal erythema present.  Eyes: Conjunctivae and EOM are normal. Pupils  are equal, round, and reactive to light.  Neck: Normal range of motion. Neck supple.  Cardiovascular: Normal rate, regular rhythm and normal heart sounds.   Pulmonary/Chest: Effort normal and breath sounds normal.  Abdominal: Soft. Bowel sounds are normal.  Lymphadenopathy:    She has cervical adenopathy.  Skin: Skin is warm and dry.      Assessment & Plan:  Pharyngitis: take medicationss as prescribed, increase fluids,  Salt water gargles. If symptoms do not improve in 5-7 days or get worse contact the office or go to ER.

## 2015-07-13 ENCOUNTER — Encounter: Payer: 59 | Admitting: Occupational Therapy

## 2015-07-27 ENCOUNTER — Ambulatory Visit: Payer: 59 | Attending: Internal Medicine | Admitting: Occupational Therapy

## 2015-07-27 DIAGNOSIS — X58XXXS Exposure to other specified factors, sequela: Secondary | ICD-10-CM | POA: Diagnosis not present

## 2015-07-27 DIAGNOSIS — S0990XS Unspecified injury of head, sequela: Secondary | ICD-10-CM | POA: Insufficient documentation

## 2015-07-27 DIAGNOSIS — F068 Other specified mental disorders due to known physiological condition: Secondary | ICD-10-CM | POA: Diagnosis present

## 2015-07-28 NOTE — Therapy (Signed)
[facility] [address] [place], [place], [address] Phone: [phone]   Fax:  [phone]  Occupational Therapy Treatment  Patient Details  Name: Nicole Waller MRN: [id number] Date of Birth: 20-Sep-1966 Referring Provider:  Unk Pinto, MD  Encounter Date: 07/27/2015      OT End of Session - 07/28/15 1638    Visit Number 2   Number of Visits 16   Date for OT Re-Evaluation 08/11/15   Authorization Type [employer]   OT Start Time 1450   OT Stop Time 1535   OT Time Calculation (min) 45 min   Activity Tolerance Patient tolerated treatment well   Behavior During Therapy [facility] for tasks assessed/performed      Past Medical History  Diagnosis Date  . Dyspnea   . Abnormal EKG   . Abnormal chest CT   . Abdominal pain   . Chronic headache   . Chronic cough   . Anxiety   . Hx of migraines   . Rosacea   . Pulmonary nodule 2011  . History of nephrolithiasis     Past Surgical History  Procedure Laterality Date  . Shoulder surgery    . Foot surgery    . Elbow surgery      There were no vitals filed for this visit.  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 No/denies      Treatment: Pt and therapist received results of Neuro psych testing. Pt was surprised at how well she did on the testing and additional occupational therapy is not recommended. Therapist met with pt and husband to discuss compensatory strategies for short term memory, difficulties with multi-tasking and organization. Pt is already using some self developed strategies. Pt/ husband verbalize understanding of recommendations. Pt plans to pursue counseling as she reports that she is her own worst competitor and she places a  significant amount of stress on herself to perform.Therapist made recommendations regarding activities to perform to continue to challenge herself cognitively, however therapist reinforced importance of a balance of work and rest.                          OT Short Term Goals - 06/14/15 0906    OT [place] #1   Title discuss neuropsych testing with pt. and set cognitive goals following testing.   Time 4   Period Weeks   Status New           OT Long Term Goals - 07/28/15 1638    OT LONG TERM GOAL #1   Title I with HEP for vision.   Status Deferred   OT LONG TERM GOAL #2   Title Pt will verbalize understanding of compensatory strategies for short term memory deficits   Status Achieved               Plan - 07/27/15 1726    Clinical Impression Statement Pt was seen for Neuropsych testing at [facility]. Therapist met with pt/ husband to address compensations for short term memory deficits and work strategies.    Pt will benefit from skilled therapeutic intervention in order to improve on the following deficits (Retired) Decreased cognition;Impaired vision/preception   Rehab Potential  Good   Clinical Impairments Affecting Rehab Potential short term memory   OT Frequency Other (comment)  2 visits   OT Duration 8 weeks   OT Treatment/Interventions Visual/perceptual remediation/compensation;Cognitive remediation/compensation;Therapeutic activities;Therapeutic exercise;Patient/family education;Self-care/ADL training   Plan No additional occupational therapy recommended at this time. Pt has results of testing and strategies to compensate.   Consulted and Agree with Plan of Care Patient;Family member/caregiver   Family Member Consulted husband        Problem List Patient Active Problem List   Diagnosis Date Noted  . TBI (traumatic brain injury) 04/03/2015  . Vitamin D deficiency 04/02/2015  . Medication management 04/02/2015  . CKD Stage 2 (GFR  72 ml/min) 04/02/2015  . Allergic rhinitis 04/02/2015  . Anxiety   . Hx of migraines   . Rosacea   . History of nephrolithiasis   . Cough variant asthma 12/19/2010  . PULMONARY NODULE 06/27/2010  . EPIGASTRIC PAIN 12/25/2009  . ABNORMAL EKG 05/10/2009  OCCUPATIONAL THERAPY DISCHARGE SUMMARY  Current functional level related to goals / functional outcomes Pt  did well on neuropysch testing and she is aware of strengths and limitations, and how to better compensate.   Remaining deficits Mild cognitive deficits   Education / Equipment: Pt/ and husband were educated in compensatory strategies for cognitive changes and activities to perform at home to challenge herself cognitively. Pt verbalizes understanding of recommendations..  Plan: Patient agrees to discharge.  Patient goals were met. Patient is being discharged due to meeting the stated rehab goals.  ?????      RINE,KATHRYN 07/28/2015, 4:48 PM Nicole Waller, OTR/L [phone] Phone: [phone] 4:48 PM 07/28/2015 [facility] [address] [place], [place], [address] Phone: [phone]   Fax:  [phone]

## 2015-08-05 ENCOUNTER — Other Ambulatory Visit: Payer: Self-pay | Admitting: Internal Medicine

## 2015-08-05 DIAGNOSIS — J309 Allergic rhinitis, unspecified: Secondary | ICD-10-CM

## 2015-09-12 ENCOUNTER — Other Ambulatory Visit: Payer: Self-pay | Admitting: Emergency Medicine

## 2015-10-08 ENCOUNTER — Other Ambulatory Visit: Payer: Self-pay | Admitting: Internal Medicine

## 2015-10-13 ENCOUNTER — Ambulatory Visit (INDEPENDENT_AMBULATORY_CARE_PROVIDER_SITE_OTHER): Payer: 59 | Admitting: Internal Medicine

## 2015-10-13 VITALS — BP 120/72 | HR 68 | Temp 97.9°F | Resp 16 | Ht 68.5 in | Wt 161.2 lb

## 2015-10-13 DIAGNOSIS — J069 Acute upper respiratory infection, unspecified: Secondary | ICD-10-CM | POA: Diagnosis not present

## 2015-10-13 DIAGNOSIS — J029 Acute pharyngitis, unspecified: Secondary | ICD-10-CM | POA: Diagnosis not present

## 2015-10-13 MED ORDER — CIPROFLOXACIN HCL 500 MG PO TABS: 500.0000 mg | ORAL_TABLET | Freq: Two times a day (BID) | ORAL | Status: AC

## 2015-10-13 MED ORDER — FLUCONAZOLE 150 MG PO TABS: 150.0000 mg | ORAL_TABLET | Freq: Once | ORAL | Status: DC

## 2015-10-13 MED ORDER — PROMETHAZINE-DM 6.25-15 MG/5ML PO SYRP: ORAL_SOLUTION | ORAL | Status: DC

## 2015-10-13 MED ORDER — ONDANSETRON HCL 4 MG PO TABS: 4.0000 mg | ORAL_TABLET | Freq: Every day | ORAL | Status: DC | PRN

## 2015-10-13 MED ORDER — AZITHROMYCIN 250 MG PO TABS: ORAL_TABLET | ORAL | Status: DC

## 2015-10-13 MED ORDER — PREDNISONE 20 MG PO TABS: ORAL_TABLET | ORAL | Status: DC

## 2015-10-13 MED ORDER — NYSTATIN 100000 UNIT/ML MT SUSP: 5.0000 mL | Freq: Four times a day (QID) | OROMUCOSAL | Status: DC

## 2015-10-13 NOTE — Patient Instructions (Signed)
Please take the prednisone until it is gone.  Please take the zpak until it is gone.  Please gargle with the nystatin rinse 4 times a day to help get rid of exudate for 4-5 days.  Travel medicine -cipro- great for diarrhea or for urinary tract infection symptoms.  -bring immodium for diarrhea  -zofran-for nausea and vomiting, take before the nausea is extreme  -bring pepcid or zantac or tums to help with heart burn  -bring benadryl

## 2015-10-13 NOTE — Progress Notes (Signed)
Patient ID: Nicole Waller, female   DOB: 06-03-1966, 49 y.o.   MRN: [id number]  HPI  Patient presents to the office for evaluation of cough.  It has been going on for 5 days.  Patient reports dry minimal cough.  They also endorse change in voice, postnasal drip and nasal congestion, sore throat.  They have tried none.  They report that nothing has worked.  They denies other sick contacts.  Review of Systems  Constitutional: Negative for fever, chills and malaise/fatigue.  HENT: Positive for congestion and sore throat. Negative for ear pain.   Respiratory: Negative for cough, shortness of breath and wheezing.   Cardiovascular: Negative for chest pain, palpitations and leg swelling.  Neurological: Negative for headaches.    PE:  Filed Vitals:   10/13/15 1118  BP: 120/72  Pulse: 68  Temp: 97.9 F (36.6 C)  Resp: 16    General:  Alert and non-toxic, WDWN, NAD HEENT: NCAT, PERLA, EOM normal, no occular discharge or erythema.  Nasal mucosal edema with sinus tenderness to palpation.  Oropharynx clear with minimal oropharyngeal edema and erythema.  Mucous membranes moist and pink. Neck:  Cervical adenopathy Chest:  RRR no MRGs.  Lungs clear to auscultation A&P with no wheezes rhonchi or rales.   Abdomen: +BS x 4 quadrants, soft, non-tender, no guarding, rigidity, or rebound. Skin: warm and dry no rash Neuro: A&Ox4, CN II-XII grossly intact  Assessment and Plan:   1. Acute URI  - nystatin (MYCOSTATIN) 100000 UNIT/ML suspension; Take 5 mLs (500,000 Units total) by mouth 4 (four) times daily.  Dispense: 60 mL; Refill: 0 - predniSONE (DELTASONE) 20 MG tablet; 3 tabs po daily x 3 days, then 2 tabs x 3 days, then 1.5 tabs x 3 days, then 1 tab x 3 days, then 0.5 tabs x 3 days  Dispense: 27 tablet; Refill: 0 - azithromycin (ZITHROMAX Z-PAK) 250 MG tablet; 2 po day one, then 1 daily x 4 days  Dispense: 6 tablet; Refill: 0  2. Acute pharyngitis, unspecified pharyngitis type  -  promethazine-dextromethorphan (PROMETHAZINE-DM) 6.25-15 MG/5ML syrup; 5-10 ml q 4 hours for cough  Dispense: 360 mL; Refill: 1

## 2016-01-14 ENCOUNTER — Other Ambulatory Visit: Payer: Self-pay | Admitting: Internal Medicine

## 2016-02-14 ENCOUNTER — Other Ambulatory Visit: Payer: Self-pay | Admitting: Internal Medicine

## 2016-03-11 ENCOUNTER — Other Ambulatory Visit: Payer: Self-pay | Admitting: Internal Medicine

## 2016-04-12 ENCOUNTER — Other Ambulatory Visit: Payer: Self-pay | Admitting: Internal Medicine

## 2016-04-16 ENCOUNTER — Ambulatory Visit (INDEPENDENT_AMBULATORY_CARE_PROVIDER_SITE_OTHER): Payer: 59 | Admitting: Internal Medicine

## 2016-04-16 ENCOUNTER — Encounter: Payer: Self-pay | Admitting: Neurology

## 2016-04-16 ENCOUNTER — Encounter: Payer: Self-pay | Admitting: Internal Medicine

## 2016-04-16 ENCOUNTER — Ambulatory Visit (INDEPENDENT_AMBULATORY_CARE_PROVIDER_SITE_OTHER): Payer: 59 | Admitting: Neurology

## 2016-04-16 VITALS — BP 90/60 | HR 66 | Ht 67.5 in | Wt 157.0 lb

## 2016-04-16 VITALS — BP 116/80 | HR 72 | Temp 97.8°F | Resp 16 | Ht 68.5 in | Wt 154.6 lb

## 2016-04-16 DIAGNOSIS — E559 Vitamin D deficiency, unspecified: Secondary | ICD-10-CM

## 2016-04-16 DIAGNOSIS — R030 Elevated blood-pressure reading, without diagnosis of hypertension: Secondary | ICD-10-CM

## 2016-04-16 DIAGNOSIS — IMO0001 Reserved for inherently not codable concepts without codable children: Secondary | ICD-10-CM | POA: Insufficient documentation

## 2016-04-16 DIAGNOSIS — J069 Acute upper respiratory infection, unspecified: Secondary | ICD-10-CM

## 2016-04-16 DIAGNOSIS — Z0000 Encounter for general adult medical examination without abnormal findings: Secondary | ICD-10-CM | POA: Diagnosis not present

## 2016-04-16 DIAGNOSIS — Z79899 Other long term (current) drug therapy: Secondary | ICD-10-CM

## 2016-04-16 DIAGNOSIS — E7800 Pure hypercholesterolemia, unspecified: Secondary | ICD-10-CM | POA: Insufficient documentation

## 2016-04-16 DIAGNOSIS — Z111 Encounter for screening for respiratory tuberculosis: Secondary | ICD-10-CM

## 2016-04-16 DIAGNOSIS — Z136 Encounter for screening for cardiovascular disorders: Secondary | ICD-10-CM | POA: Diagnosis not present

## 2016-04-16 DIAGNOSIS — Z1212 Encounter for screening for malignant neoplasm of rectum: Secondary | ICD-10-CM

## 2016-04-16 DIAGNOSIS — R4189 Other symptoms and signs involving cognitive functions and awareness: Secondary | ICD-10-CM | POA: Diagnosis not present

## 2016-04-16 DIAGNOSIS — S069X0S Unspecified intracranial injury without loss of consciousness, sequela: Secondary | ICD-10-CM

## 2016-04-16 DIAGNOSIS — R7309 Other abnormal glucose: Secondary | ICD-10-CM | POA: Insufficient documentation

## 2016-04-16 DIAGNOSIS — Z0001 Encounter for general adult medical examination with abnormal findings: Secondary | ICD-10-CM

## 2016-04-16 DIAGNOSIS — R5383 Other fatigue: Secondary | ICD-10-CM

## 2016-04-16 HISTORY — DX: Pure hypercholesterolemia, unspecified: E78.00

## 2016-04-16 LAB — HEPATIC FUNCTION PANEL
ALT: 21 U/L (ref 6–29)
AST: 21 U/L (ref 10–35)
Albumin: 4 g/dL (ref 3.6–5.1)
Alkaline Phosphatase: 73 U/L (ref 33–115)
BILIRUBIN DIRECT: 0.1 mg/dL (ref <=0.2)
BILIRUBIN INDIRECT: 0.2 mg/dL (ref 0.2–1.2)
BILIRUBIN TOTAL: 0.3 mg/dL (ref 0.2–1.2)
Total Protein: 6.4 g/dL (ref 6.1–8.1)

## 2016-04-16 LAB — CBC WITH DIFFERENTIAL/PLATELET
BASOS PCT: 1 %
Basophils Absolute: 71 cells/uL (ref 0–200)
EOS ABS: 71 {cells}/uL (ref 15–500)
Eosinophils Relative: 1 %
HEMATOCRIT: 39.9 % (ref 35.0–45.0)
Hemoglobin: 13.4 g/dL (ref 11.7–15.5)
LYMPHS ABS: 1136 {cells}/uL (ref 850–3900)
LYMPHS PCT: 16 %
MCH: 34.2 pg — ABNORMAL HIGH (ref 27.0–33.0)
MCHC: 33.6 g/dL (ref 32.0–36.0)
MCV: 101.8 fL — AB (ref 80.0–100.0)
MONO ABS: 355 {cells}/uL (ref 200–950)
MPV: 9.8 fL (ref 7.5–12.5)
Monocytes Relative: 5 %
NEUTROS ABS: 5467 {cells}/uL (ref 1500–7800)
Neutrophils Relative %: 77 %
Platelets: 271 10*3/uL (ref 140–400)
RBC: 3.92 MIL/uL (ref 3.80–5.10)
RDW: 13.5 % (ref 11.0–15.0)
WBC: 7.1 10*3/uL (ref 3.8–10.8)

## 2016-04-16 LAB — BASIC METABOLIC PANEL WITH GFR
BUN: 14 mg/dL (ref 7–25)
CALCIUM: 8.8 mg/dL (ref 8.6–10.2)
CHLORIDE: 107 mmol/L (ref 98–110)
CO2: 24 mmol/L (ref 20–31)
Creat: 0.93 mg/dL (ref 0.50–1.10)
GFR, EST AFRICAN AMERICAN: 83 mL/min (ref >=60)
GFR, EST NON AFRICAN AMERICAN: 72 mL/min (ref >=60)
GLUCOSE: 86 mg/dL (ref 65–99)
POTASSIUM: 4.4 mmol/L (ref 3.5–5.3)
Sodium: 138 mmol/L (ref 135–146)

## 2016-04-16 LAB — HEMOGLOBIN A1C
HEMOGLOBIN A1C: 5.4 % (ref <5.7)
Mean Plasma Glucose: 108 mg/dL

## 2016-04-16 LAB — MAGNESIUM: MAGNESIUM: 2.1 mg/dL (ref 1.5–2.5)

## 2016-04-16 LAB — LIPID PANEL
CHOL/HDL RATIO: 2 ratio (ref <=5.0)
CHOLESTEROL: 156 mg/dL (ref 125–200)
HDL: 79 mg/dL (ref >=46)
LDL Cholesterol: 62 mg/dL (ref <130)
TRIGLYCERIDES: 77 mg/dL (ref <150)
VLDL: 15 mg/dL (ref <30)

## 2016-04-16 LAB — IRON AND TIBC
%SAT: 20 % (ref 11–50)
Iron: 77 ug/dL (ref 40–190)
TIBC: 382 ug/dL (ref 250–450)
UIBC: 305 ug/dL (ref 125–400)

## 2016-04-16 MED ORDER — ALPRAZOLAM 0.5 MG PO TABS: ORAL_TABLET | ORAL | Status: AC

## 2016-04-16 NOTE — Progress Notes (Signed)
Chart forwarded.  

## 2016-04-16 NOTE — Patient Instructions (Signed)
We will set you up for neuropsychological testing with Dr. Si Raider.  Follow up afterwards.

## 2016-04-16 NOTE — Addendum Note (Signed)
Addended by: Gerda Diss A on: 04/16/2016 02:32 PM   Modules accepted: Orders

## 2016-04-16 NOTE — Progress Notes (Signed)
Patient ID: Nicole Waller, female   DOB: September 23, 1966, 50 y.o.   MRN: [id number]   [facility] ADULT & ADOLESCENT INTERNAL MEDICINE                   Unk Pinto, M.D.    Uvaldo Bristle. Silverio Lay, P.A.-C      Starlyn Skeans, P.A.-C   [facility]                [address] [place], [place] [id number] Telephone [phone] Telefax [phone]  ______________________________________________________________________  Annual Screening/Preventative Visit And Comprehensive Evaluation &  Examination     This very nice 50 y.o. MWF presents for a Wellness/Preventative Visit & comprehensive evaluation and management of multiple medical co-morbidities.  Patient presents for screening for elevated BP, lipids, abnormal glucose and Vitamin D Deficiency. Patient relates hx/o "TBI" in June 2014 after a head injury kicked in the head by her horse without LOC and she had subsequent ER evaluation with Head CT and MRI scans finding no significant abnormalities. She relates difficulty with ST recall, dysnomia with names and words, dyscalculia. R-L  & spatial disorientation and difficuties with attention and staying focused on taskks. 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 She has had Neurological testing in the past . She has been treated with for ADD by Dr Johnnye Sima and she is scheduled later today for a neurological re-evaluation by Dr Tomi Likens.        Patient's BP has been controlled at home and patient denies any cardiac symptoms as chest pain, palpitations, shortness of breath, dizziness or ankle swelling. Today's BP: 116/80 mmHg      Patient's hyperlipidemia is controlled with diet. Last lipids were at goal with Total Cholesterol 154, HDL 65, Trig 121 and LDL 65.      Patient is screened proactively for prediabetes predating and patient denies reactive hypoglycemic symptoms, visual blurring, diabetic polys, or paresthesias.  Last A1c was 5.5% in May 2016.      Finally, patient has history of Vitamin D Deficiency and last Vitamin D was 21 in May 2016.  Medication Sig  . PROAIR HFA inhaler 2 puffs every 4 hours as needed only  if your can't catch your breath  . ALPRAZolam  0.5 MG  Take 0.25 mg by mouth daily as needed for anxiety.  . B COMPLEX-B12 Take 1 tablet by mouth daily.  . Calcium 600-200 MG-UNIT  Take 1 tablet by mouth daily.    . cetirizine  10 MG  Take 10 mg by mouth daily.  Marland Kitchen VITAMIN D 1000 UNITS  1 tablet daily  . IMITREX 100 MG tablet TAKE 1  TAB FOR MIGRAINE. MAY REPEAT  IN 2 HRS. MAX 2 TABS/24 HRS  . MAGNESIUM  Take 400 mg  2  times daily.  . montelukast  10 MG tablet TAKE 1 TAB EVERY DAY  . Multiple Vitamin  Take 1 cap daily.    Marland Kitchen Lo-Ovral  0.3-30 MG-MCG  Take 1 tab daily.    Marland Kitchen QVAR 80  inhaler TAKE 2 PUFFS TWICE DAILY  . valACYclovir  500 MG tablet Take 500 mg by mouth daily   Allergies  Allergen Reactions  . Accolate [Zafirlukast] GI Upset  . Codeine Reaction not known  . Lyrica [Pregabalin] Fatigue  . Oxycodone   . Probiotic [Acidophilus] Headache  Past Medical History  Diagnosis Date  . Dyspnea   . Abnormal EKG   . Abnormal chest CT   . Abdominal pain   . Chronic headache   . Chronic cough   . Anxiety   . Hx of migraines   . Rosacea   . Pulmonary nodule 2011  . History of nephrolithiasis    Health Maintenance  Topic Date Due  . HIV Screening  10/21/1981  . PAP SMEAR  10/22/1987  . INFLUENZA VACCINE  06/11/2016  . TETANUS/TDAP  11/11/2016   Immunization History  Administered Date(s) Administered  . Influenza Split 08/11/2014  . Influenza Whole 08/11/2012  . Pneumococcal-Unspecified 11/11/2006  . Td 11/11/2006   Past Surgical History  Procedure Laterality Date  . Shoulder surgery    . Foot surgery    . Elbow surgery     Family History  Problem Relation Age of Onset  . Breast cancer Maternal Grandmother   . Cancer Maternal Grandmother 19     Breast,uterine,ovarian  . Colon polyps Mother   . Diabetes Mother   . Rheum arthritis Mother   . Lupus Mother   . Colon polyps Father   . Colitis Father   . Heart disease Father   . Hypertension Father   . Hyperlipidemia Father   . Diabetes Paternal Grandfather    Social History  Substance Use Topics  . Smoking status: Never Smoker   . Smokeless tobacco: Never Used  . Alcohol Use: No    ROS Constitutional: Denies fever, chills, weight loss/gain, headaches, insomnia,  night sweats, and change in appetite. Does c/o fatigue. Eyes: Denies redness, blurred vision, diplopia, discharge, itchy, watery eyes.  ENT: Denies discharge, congestion, post nasal drip, epistaxis, sore throat, earache, hearing loss, dental pain, Tinnitus, Vertigo, Sinus pain, snoring.  Cardio: Denies chest pain, palpitations, irregular heartbeat, syncope, dyspnea, diaphoresis, orthopnea, PND, claudication, edema Respiratory: denies cough, dyspnea, DOE, pleurisy, hoarseness, laryngitis, wheezing.  Gastrointestinal: Denies dysphagia, heartburn, reflux, water brash, pain, cramps, nausea, vomiting, bloating, diarrhea, constipation, hematemesis, melena, hematochezia, jaundice, hemorrhoids Genitourinary: Denies dysuria, frequency, urgency, nocturia, hesitancy, discharge, hematuria, flank pain Breast: Breast lumps, nipple discharge, bleeding.  Musculoskeletal: Denies arthralgia, myalgia, stiffness, Jt. Swelling, pain, limp, and strain/sprain. Denies falls. Skin: Denies puritis, rash, hives, warts, acne, eczema, changing in skin lesion Neuro: No weakness, tremor, incoordination, spasms, paresthesia, pain Psychiatric: Denies confusion, memory loss, sensory loss. Denies Depression. Endocrine: Denies change in weight, skin, hair change, nocturia, and paresthesia, diabetic polys, visual blurring, hyper / hypo glycemic episodes.  Heme/Lymph: No excessive bleeding, bruising, enlarged lymph nodes.  Physical Exam  BP 116/80 mmHg   Pulse 72  Temp(Src) 97.8 F (36.6 C)  Resp 16  Ht 5' 8.5" (1.74 m)  Wt 154 lb 9.6 oz (70.126 kg)  BMI 23.16 kg/m2  General Appearance: Well nourished and in no apparent distress. Eyes: PERRLA, EOMs, conjunctiva no swelling or erythema, normal fundi and vessels. Sinuses: No frontal/maxillary tenderness ENT/Mouth: EACs patent / TMs  nl. Nares clear without erythema, swelling, mucoid exudates. Oral hygiene is good. No erythema, swelling, or exudate. Tongue normal, non-obstructing. Tonsils not swollen or erythematous. Hearing normal.  Neck: Supple, thyroid normal. No bruits, nodes or JVD. Respiratory: Respiratory effort normal.  BS equal and clear bilateral without rales, rhonci, wheezing or stridor. Cardio: Heart sounds are normal with regular rate and rhythm and no murmurs, rubs or gallops. Peripheral pulses are normal and equal bilaterally without edema. No aortic or femoral bruits. Chest: symmetric with normal excursions and percussion.  Breasts: Symmetric, without lumps, nipple discharge, retractions, or fibrocystic changes.  Abdomen: Flat, soft with bowel sounds active. Nontender, no guarding, rebound, hernias, masses, or organomegaly.  Lymphatics: Non tender without lymphadenopathy.  Genitourinary:  Musculoskeletal: Full ROM all peripheral extremities, joint stability, 5/5 strength, and normal gait. Skin: Warm and dry without rashes, lesions, cyanosis, clubbing or  ecchymosis.  Neuro: Cranial nerves intact, reflexes equal bilaterally. Normal muscle tone, no cerebellar symptoms. Sensation intact.  Pysch: Alert and oriented X 3, normal affect, Insight and Judgment appropriate.   Assessment and Plan  1. Annual Preventative Screening Examination  - Microalbumin / creatinine urine ratio - EKG 12-Lead - POC Hemoccult Bld/Stl  - Urinalysis, Routine w reflex microscopic  - Vitamin B12 - Iron and TIBC - CBC with Differential/Platelet - BASIC METABOLIC PANEL WITH GFR - Hepatic function  panel - Magnesium - Lipid panel - TSH - Hemoglobin A1c - Insulin, random - VITAMIN D 25 Hydroxy   2. Elevated BP, screening  - Microalbumin / creatinine urine ratio - EKG 12-Lead - TSH  3. Elevated cholesterol, screening  - Lipid panel - TSH  4. Other abnormal glucose, screening  - Hemoglobin A1c - Insulin, random  5. Vitamin D deficiency  - VITAMIN D 25 Hydroxy   6. TBI (traumatic brain injury), without loss of consciousness, sequela ([place])   7. Screening for rectal cancer  - POC Hemoccult Bld/Stl   8. Other fatigue  - Vitamin B12 - Iron and TIBC - CBC with Differential/Platelet - TSH  9. Medication management  - Urinalysis, Routine w reflex microscopic  - CBC with Differential/Platelet - BASIC METABOLIC PANEL WITH GFR - Hepatic function panel - Magnesium   Continue prudent diet as discussed, weight control, BP monitoring, regular exercise, and medications. Discussed med's effects and SE's. Screening labs and tests as requested with regular follow-up as recommended. Over 40 minutes of exam, counseling, chart review and high complex critical decision making was performed.

## 2016-04-16 NOTE — Patient Instructions (Signed)
Recommend Adult Low Dose Aspirin or   coated  Aspirin 81 mg daily   To reduce risk of Colon Cancer 20 %,   Skin Cancer 26 % ,   Melanoma 46%   and   Pancreatic cancer 60%   ++++++++++++++++++++++++++++++++++++++++++++++++++++++ Vitamin D goal   is between 70-100.   Please make sure that you are taking your Vitamin D as directed.   It is very important as a natural anti-inflammatory   helping hair, skin, and nails, as well as reducing stroke and heart attack risk.   It helps your bones and helps with mood.  It also decreases numerous cancer risks so please take it as directed.   Low Vit D is associated with a 200-300% higher risk for CANCER   and 200-300% higher risk for HEART   ATTACK  &  STROKE.   .....................................Marland Kitchen  It is also associated with higher death rate at younger ages,   autoimmune diseases like Rheumatoid arthritis, Lupus, Multiple Sclerosis.     Also many other serious conditions, like depression, Alzheimer's  Dementia, infertility, muscle aches, fatigue, fibromyalgia - just to name a few.  ++++++++++++++++++++++++++++++++++++++++++++++++  Recommend the book "The END of DIETING" by Dr Excell Seltzer   & the book "The END of DIABETES " by Dr Excell Seltzer  At [email or web address] - get book & Audio CD's     Being diabetic has a  300% increased risk for heart attack, stroke, cancer, and alzheimer- type vascular dementia. It is very important that you work harder with diet by avoiding all foods that are white. Avoid white rice (brown & wild rice is OK), white potatoes (sweetpotatoes in moderation is OK), White bread or wheat bread or anything made out of white flour like bagels, donuts, rolls, buns, biscuits, cakes, pastries, cookies, pizza crust, and pasta (made from white flour & egg whites) - vegetarian pasta or spinach or wheat pasta is OK. Multigrain breads like Arnold's or Pepper[employer], or multigrain sandwich thins or flatbreads.  Diet,  exercise and weight loss can reverse and cure diabetes in the early stages.  Diet, exercise and weight loss is very important in the control and prevention of complications of diabetes which affects every system in your body, ie. Brain - dementia/stroke, eyes - glaucoma/blindness, heart - heart attack/heart failure, kidneys - dialysis, stomach - gastric paralysis, intestines - malabsorption, nerves - severe painful neuritis, circulation - gangrene & loss of a leg(s), and finally cancer and Alzheimers.    I recommend avoid fried & greasy foods,  sweets/candy, white rice (brown or wild rice or Quinoa is OK), white potatoes (sweet potatoes are OK) - anything made from white flour - bagels, doughnuts, rolls, buns, biscuits,white and wheat breads, pizza crust and traditional pasta made of white flour & egg white(vegetarian pasta or spinach or wheat pasta is OK).  Multi-grain bread is OK - like multi-grain flat bread or sandwich thins. Avoid alcohol in excess. Exercise is also important.    Eat all the vegetables you want - avoid meat, especially red meat and dairy - especially cheese.  Cheese is the most concentrated form of trans-fats which is the worst thing to clog up our arteries. Veggie cheese is OK which can be found in the fresh produce section at [employer] or [employer] or [employer]  ++++++++++++++++++++++++++++++++++++++++++++++++++ DASH Eating Plan  DASH stands for "Dietary Approaches to Stop Hypertension."   The DASH eating plan is a healthy eating plan that has been shown to reduce high blood  pressure (hypertension). Additional health benefits may include reducing the risk of type 2 diabetes mellitus, heart disease, and stroke. The DASH eating plan may also help with weight loss.  WHAT DO I NEED TO KNOW ABOUT THE DASH EATING PLAN?  For the DASH eating plan, you will follow these general guidelines:  Choose foods with a percent daily value for sodium of less than 5% (as listed on the food  label).  Use salt-free seasonings or herbs instead of table salt or sea salt.  Check with your health care provider or pharmacist before using salt substitutes.  Eat lower-sodium products, often labeled as "lower sodium" or "no salt added."  Eat fresh foods.  Eat more vegetables, fruits, and low-fat dairy products.    Choose whole grains. Look for the word "whole" as the first word in the ingredient list.  Choose fish   Limit sweets, desserts, sugars, and sugary drinks.  Choose heart-healthy fats.  Eat veggie cheese   Eat more home-cooked food and less restaurant, buffet, and fast food.  Limit fried foods.  [place]uffaker foods using methods other than frying.  Limit canned vegetables. If you do use them, rinse them well to decrease the sodium.  When eating at a restaurant, ask that your food be prepared with less salt, or no salt if possible.                      WHAT FOODS CAN I EAT?  Read Dr Fara Olden Fuhrman's books on The End of Dieting & The End of Diabetes  Grains  Whole grain or whole wheat bread. Brown rice. Whole grain or whole wheat pasta. Quinoa, bulgur, and whole grain cereals. Low-sodium cereals. Corn or whole wheat flour tortillas. Whole grain cornbread. Whole grain crackers. Low-sodium crackers.  Vegetables  Fresh or frozen vegetables (raw, steamed, roasted, or grilled). Low-sodium or reduced-sodium tomato and vegetable juices. Low-sodium or reduced-sodium tomato sauce and paste. Low-sodium or reduced-sodium canned vegetables.   Fruits  All fresh, canned (in natural juice), or frozen fruits.  Protein Products   All fish and seafood.  Dried beans, peas, or lentils. Unsalted nuts and seeds. Unsalted canned beans.  Dairy  Low-fat dairy products, such as skim or 1% milk, 2% or reduced-fat cheeses, low-fat ricotta or cottage cheese, or plain low-fat yogurt. Low-sodium or reduced-sodium cheeses.  Fats and Oils  Tub margarines without trans fats. Light or  reduced-fat mayonnaise and salad dressings (reduced sodium). Avocado. Safflower, olive, or canola oils. Natural peanut or almond butter.  Other  Unsalted popcorn and pretzels. The items listed above may not be a complete list of recommended foods or beverages. Contact your dietitian for more options.  +++++++++++++++++++++++++++++++++++++++++++  WHAT FOODS ARE NOT RECOMMENDED?  Grains/ White flour or wheat flour  White bread. White pasta. White rice. Refined cornbread. Bagels and croissants. Crackers that contain trans fat.  Vegetables  Creamed or fried vegetables. Vegetables in a . Regular canned vegetables. Regular canned tomato sauce and paste. Regular tomato and vegetable juices.  Fruits  Dried fruits. Canned fruit in light or heavy syrup. Fruit juice.  Meat and Other Protein Products  Meat in general - RED mwaet & White meat.  Fatty cuts of meat. Ribs, chicken wings, bacon, sausage, bologna, salami, chitterlings, fatback, hot dogs, bratwurst, and packaged luncheon meats.  Dairy  Whole or 2% milk, cream, half-and-half, and cream cheese. Whole-fat or sweetened yogurt. Full-fat cheeses or blue cheese. Nondairy creamers and whipped toppings. Processed cheese, cheese spreads, or  cheese curds.  Condiments  Onion and garlic salt, seasoned salt, table salt, and sea salt. Canned and packaged gravies. Worcestershire sauce. Tartar sauce. Barbecue sauce. Teriyaki sauce. Soy sauce, including reduced sodium. Steak sauce. Fish sauce. Oyster sauce. Cocktail sauce. Horseradish. Ketchup and mustard. Meat flavorings and tenderizers. Bouillon cubes. Hot sauce. Tabasco sauce. Marinades. Taco seasonings. Relishes.  Fats and Oils Butter, stick margarine, lard, shortening and bacon fat. Coconut, palm kernel, or palm oils. Regular salad dressings.  Pickles and olives. Salted popcorn and pretzels.  The items listed above may not be a complete list of foods and beverages to avoid.   Preventive  Care for Adults  A healthy lifestyle and preventive care can promote health and wellness. Preventive health guidelines for women include the following key practices.  A routine yearly physical is a good way to check with your health care provider about your health and preventive screening. It is a chance to share any concerns and updates on your health and to receive a thorough exam.  Visit your dentist for a routine exam and preventive care every 6 months. Brush your teeth twice a day and floss once a day. Good oral hygiene prevents tooth decay and gum disease.  The frequency of eye exams is based on your age, health, family medical history, use of contact lenses, and other factors. Follow your health care provider's recommendations for frequency of eye exams.  Eat a healthy diet. Foods like vegetables, fruits, whole grains, low-fat dairy products, and lean protein foods contain the nutrients you need without too many calories. Decrease your intake of foods high in solid fats, added sugars, and salt. Eat the right amount of calories for you.Get information about a proper diet from your health care provider, if necessary.  Regular physical exercise is one of the most important things you can do for your health. Most adults should get at least 150 minutes of moderate-intensity exercise (any activity that increases your heart rate and causes you to sweat) each week. In addition, most adults need muscle-strengthening exercises on 2 or more days a week.  Maintain a healthy weight. The body mass index (BMI) is a screening tool to identify possible weight problems. It provides an estimate of body fat based on height and weight. Your health care provider can find your BMI and can help you achieve or maintain a healthy weight.For adults 20 years and older:  A BMI below 18.5 is considered underweight.  A BMI of 18.5 to 24.9 is normal.  A BMI of 25 to 29.9 is considered overweight.  A BMI of 30 and  above is considered obese.  Maintain normal blood lipids and cholesterol levels by exercising and minimizing your intake of saturated fat. Eat a balanced diet with plenty of fruit and vegetables. Blood tests for lipids and cholesterol should begin at age 21 and be repeated every 5 years. If your lipid or cholesterol levels are high, you are over 50, or you are at high risk for heart disease, you may need your cholesterol levels checked more frequently.Ongoing high lipid and cholesterol levels should be treated with medicines if diet and exercise are not working.  If you smoke, find out from your health care provider how to quit. If you do not use tobacco, do not start.  Lung cancer screening is recommended for adults aged 31-80 years who are at high risk for developing lung cancer because of a history of smoking. A yearly low-dose CT scan of the lungs is recommended  for people who have at least a 30-pack-year history of smoking and are a current smoker or have quit within the past 15 years. A pack year of smoking is smoking an average of 1 pack of cigarettes a day for 1 year (for example: 1 pack a day for 30 years or 2 packs a day for 15 years). Yearly screening should continue until the smoker has stopped smoking for at least 15 years. Yearly screening should be stopped for people who develop a health problem that would prevent them from having lung cancer treatment.  High blood pressure causes heart disease and increases the risk of stroke. Your blood pressure should be checked at least every 1 to 2 years. Ongoing high blood pressure should be treated with medicines if weight loss and exercise do not work.  If you are 30-80 years old, ask your health care provider if you should take aspirin to prevent strokes.  Diabetes screening involves taking a blood sample to check your fasting blood sugar level. This should be done once every 3 years, after age 61, if you are within normal weight and without risk  factors for diabetes. Testing should be considered at a younger age or be carried out more frequently if you are overweight and have at least 1 risk factor for diabetes.  Breast cancer screening is essential preventive care for women. You should practice "breast self-awareness." This means understanding the normal appearance and feel of your breasts and may include breast self-examination. Any changes detected, no matter how small, should be reported to a health care provider. Women in their 77s and 30s should have a clinical breast exam (CBE) by a health care provider as part of a regular health exam every 1 to 3 years. After age 81, women should have a CBE every year. Starting at age 20, women should consider having a mammogram (breast X-ray test) every year. Women who have a family history of breast cancer should talk to their health care provider about genetic screening. Women at a high risk of breast cancer should talk to their health care providers about having an MRI and a mammogram every year.  Breast cancer gene (BRCA)-related cancer risk assessment is recommended for women who have family members with BRCA-related cancers. BRCA-related cancers include breast, ovarian, tubal, and peritoneal cancers. Having family members with these cancers may be associated with an increased risk for harmful changes (mutations) in the breast cancer genes BRCA1 and BRCA2. Results of the assessment will determine the need for genetic counseling and BRCA1 and BRCA2 testing.  Routine pelvic exams to screen for cancer are no longer recommended for nonpregnant women who are considered low risk for cancer of the pelvic organs (ovaries, uterus, and vagina) and who do not have symptoms. Ask your health care provider if a screening pelvic exam is right for you.  If you have had past treatment for cervical cancer or a condition that could lead to cancer, you need Pap tests and screening for cancer for at least 20 years after  your treatment. If Pap tests have been discontinued, your risk factors (such as having a new sexual partner) need to be reassessed to determine if screening should be resumed. Some women have medical problems that increase the chance of getting cervical cancer. In these cases, your health care provider may recommend more frequent screening and Pap tests.  Colorectal cancer can be detected and often prevented. Most routine colorectal cancer screening begins at the age of 22 years and  continues through age 74 years. However, your health care provider may recommend screening at an earlier age if you have risk factors for colon cancer. On a yearly basis, your health care provider may provide home test kits to check for hidden blood in the stool. Use of a small camera at the end of a tube, to directly examine the colon (sigmoidoscopy or colonoscopy), can detect the earliest forms of colorectal cancer. Talk to your health care provider about this at age 22, when routine screening begins. Direct exam of the colon should be repeated every 5-10 years through age 76 years, unless early forms of pre-cancerous polyps or small growths are found.  Hepatitis C blood testing is recommended for all people born from [birth year] through 1965 and any individual with known risks for hepatitis C.  Pra  Osteoporosis is a disease in which the bones lose minerals and strength with aging. This can result in serious bone fractures or breaks. The risk of osteoporosis can be identified using a bone density scan. Women ages 88 years and over and women at risk for fractures or osteoporosis should discuss screening with their health care providers. Ask your health care provider whether you should take a calcium supplement or vitamin D to reduce the rate of osteoporosis.  Menopause can be associated with physical symptoms and risks. Hormone replacement therapy is available to decrease symptoms and risks. You should talk to your health care  provider about whether hormone replacement therapy is right for you.  Use sunscreen. Apply sunscreen liberally and repeatedly throughout the day. You should seek shade when your shadow is shorter than you. Protect yourself by wearing long sleeves, pants, a wide-brimmed hat, and sunglasses year round, whenever you are outdoors.  Once a month, do a whole body skin exam, using a mirror to look at the skin on your back. Tell your health care provider of new moles, moles that have irregular borders, moles that are larger than a pencil eraser, or moles that have changed in shape or color.  Stay current with required vaccines (immunizations).  Influenza vaccine. All adults should be immunized every year.  Tetanus, diphtheria, and acellular pertussis (Td, Tdap) vaccine. Pregnant women should receive 1 dose of Tdap vaccine during each pregnancy. The dose should be obtained regardless of the length of time since the last dose. Immunization is preferred during the 27th-36th week of gestation. An adult who has not previously received Tdap or who does not know her vaccine status should receive 1 dose of Tdap. This initial dose should be followed by tetanus and diphtheria toxoids (Td) booster doses every 10 years. Adults with an unknown or incomplete history of completing a 3-dose immunization series with Td-containing vaccines should begin or complete a primary immunization series including a Tdap dose. Adults should receive a Td booster every 10 years.  Varicella vaccine. An adult without evidence of immunity to varicella should receive 2 doses or a second dose if she has previously received 1 dose. Pregnant females who do not have evidence of immunity should receive the first dose after pregnancy. This first dose should be obtained before leaving the health care facility. The second dose should be obtained 4-8 weeks after the first dose.  Human papillomavirus (HPV) vaccine. Females aged 13-26 years who have not  received the vaccine previously should obtain the 3-dose series. The vaccine is not recommended for use in pregnant females. However, pregnancy testing is not needed before receiving a dose. If a female is found to  be pregnant after receiving a dose, no treatment is needed. In that case, the remaining doses should be delayed until after the pregnancy. Immunization is recommended for any person with an immunocompromised condition through the age of 14 years if she did not get any or all doses earlier. During the 3-dose series, the second dose should be obtained 4-8 weeks after the first dose. The third dose should be obtained 24 weeks after the first dose and 16 weeks after the second dose.  Zoster vaccine. One dose is recommended for adults aged 87 years or older unless certain conditions are present.  Measles, mumps, and rubella (MMR) vaccine. Adults born before [birth year] generally are considered immune to measles and mumps. Adults born in [birth year] or later should have 1 or more doses of MMR vaccine unless there is a contraindication to the vaccine or there is laboratory evidence of immunity to each of the three diseases. A routine second dose of MMR vaccine should be obtained at least 28 days after the first dose for students attending postsecondary schools, health care workers, or international travelers. People who received inactivated measles vaccine or an unknown type of measles vaccine during 1963-1967 should receive 2 doses of MMR vaccine. People who received inactivated mumps vaccine or an unknown type of mumps vaccine before 1979 and are at high risk for mumps infection should consider immunization with 2 doses of MMR vaccine. For females of childbearing age, rubella immunity should be determined. If there is no evidence of immunity, females who are not pregnant should be vaccinated. If there is no evidence of immunity, females who are pregnant should delay immunization until after pregnancy. Unvaccinated  health care workers born before [birth year] who lack laboratory evidence of measles, mumps, or rubella immunity or laboratory confirmation of disease should consider measles and mumps immunization with 2 doses of MMR vaccine or rubella immunization with 1 dose of MMR vaccine.  Pneumococcal 13-valent conjugate (PCV13) vaccine. When indicated, a person who is uncertain of her immunization history and has no record of immunization should receive the PCV13 vaccine. An adult aged 48 years or older who has certain medical conditions and has not been previously immunized should receive 1 dose of PCV13 vaccine. This PCV13 should be followed with a dose of pneumococcal polysaccharide (PPSV23) vaccine. The PPSV23 vaccine dose should be obtained at least 8 weeks after the dose of PCV13 vaccine. An adult aged 40 years or older who has certain medical conditions and previously received 1 or more doses of PPSV23 vaccine should receive 1 dose of PCV13. The PCV13 vaccine dose should be obtained 1 or more years after the last PPSV23 vaccine dose.    Pneumococcal polysaccharide (PPSV23) vaccine. When PCV13 is also indicated, PCV13 should be obtained first. All adults aged 66 years and older should be immunized. An adult younger than age 28 years who has certain medical conditions should be immunized. Any person who resides in a nursing home or long-term care facility should be immunized. An adult smoker should be immunized. People with an immunocompromised condition and certain other conditions should receive both PCV13 and PPSV23 vaccines. People with human immunodeficiency virus (HIV) infection should be immunized as soon as possible after diagnosis. Immunization during chemotherapy or radiation therapy should be avoided. Routine use of PPSV23 vaccine is not recommended for American Indians, [place] Natives, or people younger than 65 years unless there are medical conditions that require PPSV23 vaccine. When indicated, people who  have unknown immunization and have no record of  immunization should receive PPSV23 vaccine. One-time revaccination 5 years after the first dose of PPSV23 is recommended for people aged 19-64 years who have chronic kidney failure, nephrotic syndrome, asplenia, or immunocompromised conditions. People who received 1-2 doses of PPSV23 before age 43 years should receive another dose of PPSV23 vaccine at age 75 years or later if at least 5 years have passed since the previous dose. Doses of PPSV23 are not needed for people immunized with PPSV23 at or after age 75 years.  Preventive Services / Frequency   Ages 38 to 43 years  Blood pressure check.  Lipid and cholesterol check.  Lung cancer screening. / Every year if you are aged 57-80 years and have a 30-pack-year history of smoking and currently smoke or have quit within the past 15 years. Yearly screening is stopped once you have quit smoking for at least 15 years or develop a health problem that would prevent you from having lung cancer treatment.  Clinical breast exam.** / Every year after age 95 years.  BRCA-related cancer risk assessment.** / For women who have family members with a BRCA-related cancer (breast, ovarian, tubal, or peritoneal cancers).  Mammogram.** / Every year beginning at age 63 years and continuing for as long as you are in good health. Consult with your health care provider.  Pap test.** / Every 3 years starting at age 87 years through age 20 or 53 years with a history of 3 consecutive normal Pap tests.  HPV screening.** / Every 3 years from ages 84 years through ages 19 to 62 years with a history of 3 consecutive normal Pap tests.  Fecal occult blood test (FOBT) of stool. / Every year beginning at age 50 years and continuing until age 59 years. You may not need to do this test if you get a colonoscopy every 10 years.  Flexible sigmoidoscopy or colonoscopy.** / Every 5 years for a flexible sigmoidoscopy or every 10 years  for a colonoscopy beginning at age 73 years and continuing until age 77 years.  Hepatitis C blood test.** / For all people born from [birth year] through 1965 and any individual with known risks for hepatitis C.  Skin self-exam. / Monthly.  Influenza vaccine. / Every year.  Tetanus, diphtheria, and acellular pertussis (Tdap/Td) vaccine.** / Consult your health care provider. Pregnant women should receive 1 dose of Tdap vaccine during each pregnancy. 1 dose of Td every 10 years.  Varicella vaccine.** / Consult your health care provider. Pregnant females who do not have evidence of immunity should receive the first dose after pregnancy.  Zoster vaccine.** / 1 dose for adults aged 32 years or older.  Pneumococcal 13-valent conjugate (PCV13) vaccine.** / Consult your health care provider.  Pneumococcal polysaccharide (PPSV23) vaccine.** / 1 to 2 doses if you smoke cigarettes or if you have certain conditions.  Meningococcal vaccine.** / Consult your health care provider.  Hepatitis A vaccine.** / Consult your health care provider.  Hepatitis B vaccine.** / Consult your health care provider. Screening for abdominal aortic aneurysm (AAA)  by ultrasound is recommended for people over 50 who have history of high blood pressure or who are current or former smokers.

## 2016-04-16 NOTE — Progress Notes (Addendum)
NEUROLOGY CONSULTATION NOTE  Nicole Waller MRN: [id number] DOB: 07/27/1966  Referring provider: Dr. Johnnye Sima Primary care provider: Dr. Melford Aase  Reason for consult:  Memory loss and cognitive changes after brain injury  HISTORY OF PRESENT ILLNESS: Nicole Waller is a 50 year old right-handed female who presents for memory deficits.  She is accompanied by her husband who provides some history.  She is a horse rider.  On 04/13/13, she sustained a concussion when her horse became agitated and knocked her on the right side of her head and face.  She denied loss of consciousness.  She sustained swelling around her right eye and nose bleed.  She went to the ED at [facility] for further evaluation.  CT of head and maxillofacial were personally reviewed and revealed no acute intracranial abnormalities or fracture.  Due to persistent headache, she had follow up imaging performed that year.  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  About 4 to 6 weeks after the injury, she began to experience cognitive problems.  She started having short-term memory deficits, as well as word-finding problems.  Initially, she was treated for postconcussion syndrome and prescribed gabapentin for headache.  She developed blurred vision in her right eye and was told by her eye doctor that her eyes were slightly misaligned.  She later had an MRI of the brain with and without contrast performed on 05/16/13, which again demonstrated the incidental lipoma but no evidence of brain injury.  She underwent neuro-feedback.  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  She was initially on Adderall XR, but was switched to Adzenys due to side effects.  She subsequently underwent neuropsychological testing at [facility] on 07/07/15.  Test results revealed average or above average performance across most  cognitive domains and thinking skills, with only exceptions being mildly slowed mentation thought to be secondary to mood and possibly certain medication side effects.  Testing did not demonstrate cognitive disorder and was not consistent with any organic cognitive impairment that would be explained from her head trauma.    She reports short-term memory problems such as often misplacing things.  She would second guess herself about whether she had locked the door or turned off the lights before she left.  She often will repeat questions, even just 10 minutes later. If she is performing a task and is interrupted, she will forget what she was doing.  She also developed longer term memory problems as well.  Once, she got lost while driving just 2 miles from home.  She has trouble recognizing colleagues she works with.  Following the injury, she had to relearn to tie her shoes and ride a horse.  She struggles to remember how to perform the riding test in order to qualify to compete in the nationals.  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  She needs to constantly take notes.  She needs to use a calculator 100% of the time.  She has not made any consequential mistakes at work that have jeopardized her job.    She also reports both word-finding difficulty and trouble getting words out.  She knows sign language.  Initially, she started signing when she couldn't communicate verbally.  She reports change in personality and behavior.  She had developed attention deficit symptoms.  Since the accident, symptoms have been stable, if  not only slightly improved.  She takes B12 supplement.  PAST MEDICAL HISTORY: Past Medical History  Diagnosis Date  . Dyspnea   . Abnormal EKG   . Abnormal chest CT   . Abdominal pain   . Chronic headache   . Chronic cough   . Anxiety   . Hx of migraines   . Rosacea   . Pulmonary nodule 2011  . History of nephrolithiasis   .  Exercise-induced asthma     PAST SURGICAL HISTORY: Past Surgical History  Procedure Laterality Date  . Shoulder surgery Bilateral   . Foot surgery Left   . Elbow surgery Right     MEDICATIONS: Current Outpatient Prescriptions on File Prior to Visit  Medication Sig Dispense Refill  . albuterol (PROAIR HFA) 108 (90 BASE) MCG/ACT inhaler 2 puffs every 4 hours as needed only  if your can't catch your breath 1 Inhaler 1  . ALPRAZolam (XANAX) 0.5 MG tablet Take 1/2 to 1 tablet 3 x / day if need for Anxiety 90 tablet 5  . Amphetamine ER (ADZENYS XR-ODT) 9.4 MG TBED Take 1 tablet by mouth daily.    . B Complex Vitamins (B COMPLEX-B12) TABS Take 1 tablet by mouth daily.    . Calcium 600-200 MG-UNIT per tablet Take 1 tablet by mouth daily.      . cetirizine (ZYRTEC) 10 MG tablet Take 10 mg by mouth daily.    . cholecalciferol (VITAMIN D) 1000 UNITS tablet 1 tablet daily    . IMITREX 100 MG tablet TAKE ONE TABLET NOW FOR MIGRAINE. MAY REPEAT DOSE ONCE IN 2 HOURS. MAX 2 TABS/24 HOURS 9 tablet 11  . MAGNESIUM GLYCINATE PLUS PO Take 400 mg by mouth 2 (two) times daily.    . montelukast (SINGULAIR) 10 MG tablet TAKE 1 TABLET BY MOUTH EVERY DAY 90 tablet 1  . Multiple Vitamin (MULTIVITAMIN) capsule Take 1 capsule by mouth daily.      . norgestrel-ethinyl estradiol (LO/OVRAL) 0.3-30 MG-MCG per tablet Take 1 tablet by mouth daily.      Marland Kitchen QVAR 80 MCG/ACT inhaler TAKE 2 PUFFS TWICE DAILY 8.7 g 11  . SUMAtriptan (IMITREX) 20 MG/ACT nasal spray Place 20 mg into the nose every 2 (two) hours as needed for migraine or headache. May repeat in 2 hours if headache persists or recurs.    . valACYclovir (VALTREX) 500 MG tablet Take 500 mg by mouth daily as needed (for outbreak).      No current facility-administered medications on file prior to visit.    ALLERGIES: Allergies  Allergen Reactions  . Accolate [Zafirlukast]     GI Upset  . Codeine     REACTION: Reaction not known  . Lyrica [Pregabalin]      Fatigue  . Oxycodone   . Probiotic [Acidophilus]     Headache    FAMILY HISTORY: Family History  Problem Relation Age of Onset  . Breast cancer Maternal Grandmother   . Cancer Maternal Grandmother 38    Breast,uterine,ovarian  . Colon polyps Mother   . Diabetes Mother   . Rheum arthritis Mother   . Lupus Mother   . Colon polyps Father   . Colitis Father   . Heart disease Father   . Hypertension Father   . Hyperlipidemia Father   . Diabetes Paternal Grandfather     SOCIAL HISTORY: Social History   Social History  . Marital Status: Married    Spouse Name: N/A  . Number of Children: N/A  .  Years of Education: N/A   Occupational History  . Not on file.   Social History Main Topics  . Smoking status: Never Smoker   . Smokeless tobacco: Never Used  . Alcohol Use: No  . Drug Use: No  . Sexual Activity: Not on file   Other Topics Concern  . Not on file   Social History Narrative    REVIEW OF SYSTEMS: Constitutional: No fevers, chills, or sweats, no generalized fatigue, change in appetite Eyes: No visual changes, double vision, eye pain Ear, nose and throat: No hearing loss, ear pain, nasal congestion, sore throat Cardiovascular: No chest pain, palpitations Respiratory:  No shortness of breath at rest or with exertion, wheezes GastrointestinaI: No nausea, vomiting, diarrhea, abdominal pain, fecal incontinence Genitourinary:  No dysuria, urinary retention or frequency Musculoskeletal:  No neck pain, back pain Integumentary: No rash, pruritus, skin lesions Neurological: as above Psychiatric: No depression, insomnia, anxiety Endocrine: No palpitations, fatigue, diaphoresis, mood swings, change in appetite, change in weight, increased thirst Hematologic/Lymphatic:  No purpura, petechiae. Allergic/Immunologic: no itchy/runny eyes, nasal congestion, recent allergic reactions, rashes  PHYSICAL EXAM: Filed Vitals:   04/16/16 1239  BP: 90/60  Pulse: 66   General:  No acute distress.  Patient appears well-groomed.  Head:  Normocephalic/atraumatic Eyes:  fundi examined but not visualized Neck: supple, no paraspinal tenderness, full range of motion Back: No paraspinal tenderness Heart: regular rate and rhythm Lungs: Clear to auscultation bilaterally. Vascular: No carotid bruits. Neurological Exam: Mental status: alert and oriented to person, place, and time, recent memory fair, remote memory intact, fund of knowledge intact, attention and concentration intact, speech fluent and not dysarthric, language intact. Montreal Cognitive Assessment  04/16/2016  Visuospatial/ Executive (0/5) 5  Naming (0/3) 3  Attention: Read list of digits (0/2) 2  Attention: Read list of letters (0/1) 1  Attention: Serial 7 subtraction starting at 100 (0/3) 1  Language: Repeat phrase (0/2) 2  Language : Fluency (0/1) 1  Abstraction (0/2) 2  Delayed Recall (0/5) 3  Orientation (0/6) 6  Total 26  Adjusted Score (based on education) 26   Cranial nerves: CN I: not tested CN II: pupils equal, round and reactive to light, visual fields intact CN III, IV, VI:  full range of motion, no nystagmus, no ptosis CN V: hypersensitivity to right side of face (V1-V3) CN VII: upper and lower face symmetric CN VIII: hearing intact CN IX, X: gag intact, uvula midline CN XI: sternocleidomastoid and trapezius muscles intact CN XII: tongue midline Bulk & Tone: normal, no fasciculations. Motor:  5/5 throughout  Sensation: temperature and vibration sensation intact. Deep Tendon Reflexes:  2+ throughout, toes downgoing.  Finger to nose testing:  Without dysmetria.  Heel to shin:  Without dysmetria.  Gait:  Normal station and stride.  Able to turn and tandem walk. Romberg negative.  IMPRESSION: Cognitive deficits since face injury in 2014.  Overall, cognitive performance within normal range.  Prior testing suggested symptoms likely of psychological etiology rather than cognitive  impairment  PLAN: 1.  Will repeat neuropsychological testing. 2.  Follow up afterwards.  If testing does confirm cognitive impairment that may be attributed to the injury, then treatment would be managed by psychology.  Thank you for allowing me to take part in the care of this patient.  Metta Clines, DO  CC:  Amy Johnnye Sima, DO  Unk Pinto, MD

## 2016-04-16 NOTE — Addendum Note (Signed)
Addended by: Gerda Diss A on: 04/16/2016 02:08 PM   Modules accepted: Orders

## 2016-04-17 LAB — URINALYSIS, ROUTINE W REFLEX MICROSCOPIC
Bilirubin Urine: NEGATIVE
Glucose, UA: NEGATIVE
HGB URINE DIPSTICK: NEGATIVE
KETONES UR: NEGATIVE
LEUKOCYTES UA: NEGATIVE
NITRITE: NEGATIVE
PH: 6 (ref 5.0–8.0)
Protein, ur: NEGATIVE
Specific Gravity, Urine: 1.027 (ref 1.001–1.035)

## 2016-04-17 LAB — MICROALBUMIN / CREATININE URINE RATIO
CREATININE, URINE: 238 mg/dL (ref 20–320)
MICROALB UR: 1.1 mg/dL
Microalb Creat Ratio: 5 mcg/mg creat (ref <30)

## 2016-04-17 LAB — VITAMIN D 25 HYDROXY (VIT D DEFICIENCY, FRACTURES): VIT D 25 HYDROXY: 61 ng/mL (ref 30–100)

## 2016-04-17 LAB — VITAMIN B12: VITAMIN B 12: 340 pg/mL (ref 200–1100)

## 2016-04-17 LAB — INSULIN, RANDOM: Insulin: 4.9 u[IU]/mL (ref 2.0–19.6)

## 2016-04-17 LAB — TSH: TSH: 1.97 mIU/L

## 2016-04-23 ENCOUNTER — Ambulatory Visit (INDEPENDENT_AMBULATORY_CARE_PROVIDER_SITE_OTHER): Payer: 59 | Admitting: Psychology

## 2016-04-23 DIAGNOSIS — S069X0S Unspecified intracranial injury without loss of consciousness, sequela: Secondary | ICD-10-CM | POA: Diagnosis not present

## 2016-04-23 DIAGNOSIS — R4189 Other symptoms and signs involving cognitive functions and awareness: Secondary | ICD-10-CM

## 2016-04-23 NOTE — Progress Notes (Signed)
NEUROPSYCHOLOGICAL INTERVIEW (CPT: [id number])  Name: Nicole Waller Date of Birth: [birth year] Date of Interview: 04/23/2016  Reason for Referral:  Nicole Waller is a 50 y.o. , married female who is referred for neuropsychological re-evaluation by Dr. Metta Clines of [facility] due to persisting subjective cognitive complaints following facial injury in 2014. This patient is accompanied in the office by her husband, Nicole Waller, who supplements the history.   Want a waiver from [place] Would need it by first of July. July 17  History of Presenting Problem:  Nicole Waller reportedly sustained a concussion on 04/13/2013 when her horse became agitated and knocked her on the right side of her head and face. According to records reviewed, she did not lose consciousness. She experienced swelling around her right eye, significant nosebleed and severe pain. She went to the emergency department at [facility] for evaluation. A CT of the head revealed no acute intracranial abnormalities or fracture. 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 She reportedly began experiencing cognitive problems about 4-6 weeks after the injury. An MRI of the brain with and without contrast was performed on 05/16/2013 which again demonstrated the incidental lipoma but no evidence of brain injury.   Since then, the patient has undergone neuro feedback. She has been evaluated by [facility] and treated with Adzenys. She has also undergone neuropsychological evaluation in 06/2015 at [facility]. A scanned copy of the report of this evaluation is available within Epic and was personally reviewed. Results were all within normal limits. There was no diagnosis of a cognitive disorder. There was evidence of depression and anxiety.   The  patient reported that after her concussion she had to relearn how to do multiple tasks that were previously very familiar to her. For example, she reported that she had to re-learn how to ride a horse, how to tie her shoes, and how to get to places that were very close to home.   At the current time, the patient reported that some cognitive symptoms have improved since the concussion but that she continues to many symptoms. Current cognitive symptoms reportedly include language difficulties (word substitutions, pronunciation difficulties), issues with "cognitive processing", working memory difficulties, and memory loss (May know that something happened but cannot recall exactly when or the details). She does feel that since seeing Dr. Johnnye Sima and getting on ADHD medication, her focus and therefore memory is better.  The patient's husband reported significant frustration related to being told after her previous neuropsychological evaluation that she is "normal". He stated, "this is not my wife, this is not normal for her." He provided several examples of recent situations to illustrate her ongoing cognitive difficulties. For example, he noted that she believes out important pieces of information when relaying messages to him. She demonstrates loss of knowledge she previously had. 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 She demonstrates some confusion.  The patient continues to compete nationally in dressage. Competitions consist of performing rehearsed tests. The patient and her husband reported that she is unable to remember the tests, which was never a problem for her before the concussion. She and her husband are hoping that she can receive a medical  excuse prior to her next competition in July in order to have someone read the test to her as she performs the competition, so that she  will not have to rely on memory.  In addition to cognitive changes, Nicole Waller and her husband report personality and emotional changes. She reported that she is more anxious and more on edge. She has lost self-confidence. She has difficulty controlling her temper. She is somewhat disinhibited with less filter. She has a lower frustration tolerance. She reported increased tearfulness and depression. She has been less interactive with friends and clients due to her difficulty organizing activities. She denied any anxiety surrounding horse riding or being around her horses including the one that hurt her.  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 She reported that she had to "learn my job all over again". She is able to perform her job duties but noted that it requires significant compensatory strategies and increased effort. She reported that her boss has brought things to her attention by she feels the issues he has noted are "petty" (e.g., forgetting to get someone to go in her place to a meeting that she had to miss).  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 She reported this work is therapeutic for her. 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  With regard to complex ADLs, the patient continues to drive. She reported that she does not get lost when driving anymore. She has not had any accidents. She continues to manage her medications but does have occasional difficulty remembering them. She manages the family finances/bills and has not had any lately or missed payments. She tracks and manages her appointments independently. She reported that she often forgets to do simple tasks. For example, she noted that she may forget to shave 1 underarm or put eyeliner on one of her eyes. On one occasion, she  reportedly put her shoes on the wrong feet.  The patient denied any chronic pain. She denied any problems with balance or walking at the present time. She denied difficulties with sleep. Her appetite is reportedly good. She denied hallucinations. She denied suicidal ideation or intention.  The patient has a prior history of migraines but interestingly these seemed to improve after her concussion.  Mrs. Andreatta denied psychiatric history prior to her concussion. She has no history of psychiatric treatment, she has never undergone counseling or psychotherapy but states that she would be open to this. She denied history of substance abuse or dependence. She is a social drinker. Interestingly, she noted that prior to her concussion she was unable to drink alcohol because she would have migraines secondary to sulfite sensitivity; however now she is able to consume a glass of wine without any problems.   Family history is negative for neurologic conditions aside from brain aneurysm and stroke in her father at age 85. She has no family history of dementia. She denied family psychiatric history.    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 No tobacco. No illicit drugs.  Medical History: Past Medical History  Diagnosis Date  . Dyspnea   . Abnormal EKG   . Abnormal chest CT   . Abdominal pain   .  Chronic headache   . Chronic cough   . Anxiety   . Hx of migraines   . Rosacea   . Pulmonary nodule 2011  . History of nephrolithiasis   . Exercise-induced asthma      Current Medications:  Outpatient Encounter Prescriptions as of 04/23/2016  Medication Sig  . albuterol (PROAIR HFA) 108 (90 BASE) MCG/ACT inhaler 2 puffs every 4 hours as needed only  if your can't catch your breath  . ALPRAZolam (XANAX)  0.5 MG tablet Take 1/2 to 1 tablet 3 x / day if need for Anxiety  . Amphetamine ER (ADZENYS XR-ODT) 9.4 MG TBED Take 1 tablet by mouth daily.  Marland Kitchen aspirin 81 MG tablet Take 81 mg by mouth every other day.  . B Complex Vitamins (B COMPLEX-B12) TABS Take 1 tablet by mouth daily.  . Calcium 600-200 MG-UNIT per tablet Take 1 tablet by mouth daily.    . cetirizine (ZYRTEC) 10 MG tablet Take 10 mg by mouth daily.  . cholecalciferol (VITAMIN D) 1000 UNITS tablet 1 tablet daily  . Coenzyme Q10 (COQ10) 100 MG CAPS Take by mouth daily.  . IMITREX 100 MG tablet TAKE ONE TABLET NOW FOR MIGRAINE. MAY REPEAT DOSE ONCE IN 2 HOURS. MAX 2 TABS/24 HOURS  . MAGNESIUM GLYCINATE PLUS PO Take 400 mg by mouth 2 (two) times daily.  . montelukast (SINGULAIR) 10 MG tablet TAKE 1 TABLET BY MOUTH EVERY DAY  . Multiple Vitamin (MULTIVITAMIN) capsule Take 1 capsule by mouth daily.    . norgestrel-ethinyl estradiol (LO/OVRAL) 0.3-30 MG-MCG per tablet Take 1 tablet by mouth daily.    Marland Kitchen QVAR 80 MCG/ACT inhaler TAKE 2 PUFFS TWICE DAILY  . SUMAtriptan (IMITREX) 20 MG/ACT nasal spray Place 20 mg into the nose every 2 (two) hours as needed for migraine or headache. May repeat in 2 hours if headache persists or recurs.  . valACYclovir (VALTREX) 500 MG tablet Take 500 mg by mouth daily as needed (for outbreak).    No facility-administered encounter medications on file as of 04/23/2016.   The patient reported that she typically takes 1/4 of a Xanax tablet for anxiety. She finds that this takes the edge off her anxiety but does not exacerbate cognitive difficulties.  Behavioral Observations:   Appearance: Neatly and appropriately dressed and groomed Gait: Ambulated independently, no abnormalities observed Speech: Fluent; normal rate, rhythm and volume Thought process: Linear, goal directed Affect: Full, anxious Interpersonal: Pleasant, appropriate   TESTING: There is medical necessity to proceed with neuropsychological  assessment as the results will be used to aid in differential diagnosis and clinical decision-making and to inform specific treatment recommendations. 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 Despite normal evaluation approximately 10 months ago, the patient and her husband report ongoing and significant cognitive concerns.   PLAN: The patient will return for a full battery of neuropsychological testing with a psychometrician under my supervision. Education regarding testing procedures was provided. Subsequently, the patient will see this provider for a follow-up session at which time her test performances and my impressions and treatment recommendations will be reviewed in detail.   Full neuropsychological evaluation report to follow.  60 minutes face-to-face time was spent with the patient and her husband in clinical interview (CPT [phone])

## 2016-04-25 ENCOUNTER — Ambulatory Visit (INDEPENDENT_AMBULATORY_CARE_PROVIDER_SITE_OTHER): Payer: 59 | Admitting: Psychology

## 2016-04-25 DIAGNOSIS — S069X0S Unspecified intracranial injury without loss of consciousness, sequela: Secondary | ICD-10-CM | POA: Diagnosis not present

## 2016-04-25 DIAGNOSIS — R4189 Other symptoms and signs involving cognitive functions and awareness: Secondary | ICD-10-CM

## 2016-04-27 ENCOUNTER — Encounter: Payer: Self-pay | Admitting: Psychology

## 2016-05-02 ENCOUNTER — Ambulatory Visit (INDEPENDENT_AMBULATORY_CARE_PROVIDER_SITE_OTHER): Payer: 59 | Admitting: Psychology

## 2016-05-02 DIAGNOSIS — S069X0S Unspecified intracranial injury without loss of consciousness, sequela: Secondary | ICD-10-CM

## 2016-05-02 DIAGNOSIS — F32A Depression, unspecified: Secondary | ICD-10-CM

## 2016-05-02 DIAGNOSIS — F329 Major depressive disorder, single episode, unspecified: Secondary | ICD-10-CM | POA: Diagnosis not present

## 2016-05-02 NOTE — Patient Instructions (Addendum)
Diagnosis: Post concussion syndrome Relative weaknesses were noted in psychomotor processing speed and initial encoding of non-contextual information (although the latter is significantly improved by repetition of to be learned information).  On psychological testing, you reported significant depression as well as a very high level of concern about your cognitive abilities and thought processes. I am concerned that your symptoms of depression are contributing to your cognitive symptoms in daily life. Therefore, I am recommending that you speak with your physician about medication for depression. I also highly recommend that you do cognitive behavioral therapy with a psychologist or counselor.  I have given you information on post concussion syndrome and strategies to manage symptoms.  Below is more information about how depression (and anxiety) can affect cognitive functioning.  The effect of depression and anxiety on your cognitive functioning: . One of the typical symptoms of depression is difficulty concentrating and making decisions, and various types of anxiety also interfere with attention and concentration . Problems with attention and concentration can disrupt the process of learning and making new memories, which can make it seem like there is a problem with your memory. In your daily life, you may experience this disruption as forgetting names and appointments, misplacing items, and needing to make lists for shopping and errands. It may be harder for you to stay focused on tasks and feel as "sharp" as you did in the past.  . Also, when we are depressed or anxious, we often pay more attention to our difficulties (rather than our strengths) in our daily life, and this can make it seem to [place] like we are doing worse cognitively than we really are. . The cognitive aspects of depression and anxiety are sometimes observed as an identifiable pattern of poor performance on a neuropsychological evaluation,  but it is also possible that all scores on an evaluation are within normal limits. . Regardless of the test scores, distress related to depression and anxiety can interfere with the ability to make use of your cognitive resources and function optimally across settings such as work or school, maintaining the home and responsibilities, and personal relationships. . Fortunately, there are treatments for depression and anxiety, and when mood improves, cognitive functioning in daily life often improves. . Treatment options include psychotherapy, medications (e.g., antidepressants), and behavioral changes, such as increasing your involvement in enjoyable activities, increasing the amount of exercise you are getting, and maintaining a regular routine.    Cognitive behavioral therapy targeted at post-concussion syndrome symptoms: Cecille Rubin, Chauncey Cruel., & Owens Shark, R. (2012). Cognitive behavioural therapy and persistent post-concussional symptoms: Integrating conceptual issues and practical aspects in treatment. Neuropsychological Rehabilitation, 22(1), 1-25.]

## 2016-05-02 NOTE — Progress Notes (Signed)
NEUROPSYCHOLOGICAL EVALUATION   Name:    Nicole Waller  Date of Birth:   11-15-1965 Date of Interview:  04/23/2016 Date of Testing:  04/25/2016   Date of Feedback:  05/02/2016     Background Information:  Reason for Referral:  Nicole Waller is a 50 y.o., married female referred by Dr. Metta Clines to assess her current level of cognitive functioning and assist in differential diagnosis. The current evaluation consisted of a review of available medical records, an interview with the patient and her husband, and the completion of a neuropsychological testing battery. Informed consent was obtained.  History of Presenting Problem:  Nicole Waller reportedly sustained a concussion on 04/13/2013 when her horse became agitated and knocked her on the right side of her head and face. According to records reviewed, she did not lose consciousness. She experienced swelling around her right eye, significant nosebleed and severe pain. She went to the emergency department at [facility] for evaluation. A CT of the head revealed no acute intracranial abnormalities or fracture. 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 She reportedly began experiencing cognitive problems about 4-6 weeks after the injury. An MRI of the brain with and without contrast was performed on 05/16/2013 which again demonstrated the incidental lipoma but no evidence of brain injury.   Since then, the patient has undergone neuro feedback. She has been evaluated by [facility] and treated with Adzenys. She has also undergone neuropsychological evaluation in 06/2015 at [facility]. A scanned copy of the report of this evaluation is available within Epic and was personally reviewed. Results were all within normal limits. There was no diagnosis of a cognitive disorder. There  was evidence of depression and anxiety.  The patient reported that after her concussion she had to relearn how to do multiple tasks that were previously very familiar to her. For example, she reported that she had to re-learn how to ride a horse, how to tie her shoes, and how to get to places that were very close to home.   At the current time, the patient reported that some cognitive symptoms have improved since the concussion but that she continues to many symptoms. Current cognitive symptoms reportedly include language difficulties (word substitutions, pronunciation difficulties), issues with "cognitive processing", working memory difficulties, and memory loss (May know that something happened but cannot recall exactly when or the details). She does feel that since seeing Dr. Johnnye Sima and getting on ADHD medication, her focus and therefore memory is better.  The patient's husband reported significant frustration related to being told after her previous neuropsychological evaluation that she is "normal". He stated, "this is not my wife, this is not normal for her." He provided several examples of recent situations to illustrate her ongoing cognitive difficulties. For example, he noted that she believes out important pieces of information when relaying messages to him. She demonstrates loss of knowledge she previously had. 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 She demonstrates some confusion.  The patient continues to compete nationally in dressage. Competitions consist of performing rehearsed tests. The patient and her husband reported that she is unable to remember the tests, which was never a problem for her before  the concussion. She and her husband are hoping that she can receive a medical excuse prior to her next competition in July in order to have someone read the test to  her as she performs the competition, so that she will not have to rely on memory.  In addition to cognitive changes, Nicole Waller and her husband report personality and emotional changes. She reported that she is more anxious and more on edge. She has lost self-confidence. She has difficulty controlling her temper. She is somewhat disinhibited with less filter. She has a lower frustration tolerance. She reported increased tearfulness and depression. She has been less interactive with friends and clients due to her difficulty organizing activities. She denied any anxiety surrounding horse riding or being around her horses including the one that hurt her.  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 She reported that she had to "learn my job all over again". She is able to perform her job duties but noted that it requires significant compensatory strategies and increased effort. She reported that her boss has brought things to her attention by she feels the issues he has noted are "petty" (e.g., forgetting to get someone to go in her place to a meeting that she had to miss).  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 She reported this work is therapeutic for her. 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  With regard to complex ADLs, the patient continues to drive. She reported that she does not get lost when driving anymore. She has not had any accidents. She continues to manage her medications but does have occasional difficulty remembering them. She manages the family finances/bills and has not had any lately or missed payments. She tracks and manages her appointments independently. She reported that she often forgets to do simple tasks. For example, she noted that she may forget to shave 1 underarm or put  eyeliner on one of her eyes. On one occasion, she reportedly put her shoes on the wrong feet.  The patient denied any chronic pain. She denied any problems with balance or walking at the present time. She denied difficulties with sleep. Her appetite is reportedly good. She denied hallucinations. She denied suicidal ideation or intention.  The patient has a prior history of migraines but interestingly these seemed to improve after her concussion.  Nicole Waller denied psychiatric history prior to her concussion. She has no history of psychiatric treatment, she has never undergone counseling or psychotherapy but states that she would be open to this. She denied history of substance abuse or dependence. She is a social drinker. Interestingly, she noted that prior to her concussion she was unable to drink alcohol because she would have migraines secondary to sulfite sensitivity; however now she is able to consume a glass of wine without any problems.  Family history is negative for neurologic conditions aside from brain aneurysm and stroke in her father at age 60. She has no family history of dementia. She denied family psychiatric history.  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 No tobacco. No illicit drugs.  Medical History:  Past Medical History  Diagnosis Date  . Dyspnea   . Abnormal EKG   .  Abnormal chest CT   . Abdominal pain   . Chronic headache   . Chronic cough   . Anxiety   . Hx of migraines   . Rosacea   . Pulmonary nodule 2011  . History of nephrolithiasis   . Exercise-induced asthma     Current medications:  Outpatient Encounter Prescriptions as of 05/02/2016  Medication Sig  . albuterol (PROAIR HFA) 108 (90 BASE) MCG/ACT inhaler 2 puffs every 4 hours as needed only  if your  can't catch your breath  . ALPRAZolam (XANAX) 0.5 MG tablet Take 1/2 to 1 tablet 3 x / day if need for Anxiety  . Amphetamine ER (ADZENYS XR-ODT) 9.4 MG TBED Take 1 tablet by mouth daily.  Marland Kitchen aspirin 81 MG tablet Take 81 mg by mouth every other day.  . B Complex Vitamins (B COMPLEX-B12) TABS Take 1 tablet by mouth daily.  . Calcium 600-200 MG-UNIT per tablet Take 1 tablet by mouth daily.    . cetirizine (ZYRTEC) 10 MG tablet Take 10 mg by mouth daily.  . cholecalciferol (VITAMIN D) 1000 UNITS tablet 1 tablet daily  . Coenzyme Q10 (COQ10) 100 MG CAPS Take by mouth daily.  . IMITREX 100 MG tablet TAKE ONE TABLET NOW FOR MIGRAINE. MAY REPEAT DOSE ONCE IN 2 HOURS. MAX 2 TABS/24 HOURS  . MAGNESIUM GLYCINATE PLUS PO Take 400 mg by mouth 2 (two) times daily.  . montelukast (SINGULAIR) 10 MG tablet TAKE 1 TABLET BY MOUTH EVERY DAY  . Multiple Vitamin (MULTIVITAMIN) capsule Take 1 capsule by mouth daily.    . norgestrel-ethinyl estradiol (LO/OVRAL) 0.3-30 MG-MCG per tablet Take 1 tablet by mouth daily.    Marland Kitchen QVAR 80 MCG/ACT inhaler TAKE 2 PUFFS TWICE DAILY  . SUMAtriptan (IMITREX) 20 MG/ACT nasal spray Place 20 mg into the nose every 2 (two) hours as needed for migraine or headache. May repeat in 2 hours if headache persists or recurs.  . valACYclovir (VALTREX) 500 MG tablet Take 500 mg by mouth daily as needed (for outbreak).    No facility-administered encounter medications on file as of 05/02/2016.   Current Examination:  Behavioral Observations:  Appearance: Neatly and appropriately dressed and groomed Gait: Ambulated independently, no abnormalities observed Speech: Fluent; normal rate, rhythm and volume Thought process: Linear, goal directed Affect: Full, anxious Interpersonal: Pleasant, appropriate Orientation: Oriented to all spheres Note: The patient did not take ADHD medication  (Adzenys) on the day of this evaluation.  Tests Administered: . Test of Premorbid Functioning  (TOPF) . Wechsler Adult Intelligence Scale-Fourth Edition (WAIS-IV): Similarities, Information, Block Design, Matrix Reasoning, Arithmetic, Symbol Search, Coding and Digit Span subtests . [place] Verbal Learning Test - 2nd Edition (CVLT-2) Standard Form . Rey Complex Figure Test - Copy, 3 minute delay, 30 minute delay, and recognition . Neuropsychological Assessment Battery (NAB) Language Module, Form 1: Naming Subtest . Controlled Oral Word Association Test (COWAT) . Trail Making Test A and B . Clock drawing test . Beck Depression Inventory - Second edition (BDI-II) . Personality Assessment Inventory (PAI)  Test Results: Note: Standardized scores are presented only for use by appropriately trained professionals and to allow for any future test-retest comparison. These scores should not be interpreted without consideration of all the information that is contained in the rest of the report. The most recent standardization samples from the test publisher or other sources were used whenever possible to derive standard scores; scores were corrected for age, gender, ethnicity and education when available.   Test Scores:  Test Name Standardized Score Descriptor  TOPF SS=106 Average  WAIS-IV Subtests    Similarities ss=10 Average  Information ss=13 High Average  Block Design ss=13 High Average  Matrix Reasoning ss=13 High Average  Arithmetic ss=11 Average  Digit Span ss=15 Superior  Symbol Search ss=6 Low average  Coding ss=8 Low end of average  WAIS-IV Index scores    Verbal Comprehension SS=108 Average  Perceptual Reasoning SS=117 High Average  Working Memory SS=117 High Average  Processing Speed SS=84 Low Average  Full Scale IQ (8 subtest) SS=107 Average  CVLT-II Scores    Trial 1 Z= -1.5 Borderline  Trial 5 Z= 1 High average  Trials 1-5 total T= 48 Average  SD Free Recall Z= 1 High average  SD Cued Delay Recall Z= 1 High average  LD Free Recall Z= 1.5 Superior  LD Cued Recall  Z= 1 High average  Recognition Discriminability (16/16 hits, 1 false positive) Z= 1 High average  RCFT     Copy 32/36   3 minute delay T=57 High Average  30 minute delay T=53 Average  Recognition T=71 Superior  NAB Naming T=54 Average  COWAT-FAS T=50 Average  COWAT-Animals T=55 Average  Trail Making Test A 0 errors T=52 Average  Trail Making Test B 0 errors T=60 High Average  Clock Drawing  WNL   BDI-II 22 Moderate  PAI (Only elevated clinical scales are shown here)    SCZ T=71      Description of Test Results: This appears to be a valid study, based on performances on tests of memory malingering and embedded validity measures. Premorbid verbal intellectual abilities were estimated to have been within the average range based on a test of word reading. Psychomotor processing speed was low average, and an area of relative weakness. Auditory attention and working memory were high average. Visual-spatial construction was variable. Her construction of three dimensional blocks to match a model was high average. Her copy of a complex geometric figure was grossly intact but demonstrated mildly reduced planning and organization. Language abilities were within normal limits. Specifically, confrontation naming and semantic verbal fluency were average. With regard to verbal memory, encoding and acquisition of non-contextual information (i.e., word list) was average across five learning trials. After an interference task, free recall was high average. After a 20-minute delay, free recall was superior, demonstrating excellent consolidation of previously encoded information. Performance on a yes/no recognition task was high average. With regard to non-verbal memory, delayed free recall of visual information was high average and average after 3 and 30 minute delays, respectively. Performance on a yes/no recognition task was superior with 100% accuracy. Executive functioning was intact. Mental flexibility and  set-shifting were high average on Trails B. Verbal abstract reasoning was average, and non-verbal abstract reasoning was high average. Verbal fluency with phonemic search restrictions was average. Performance on a clock drawing task was intact.    On a self-report questionnaire of mood, the patient's responses were indicative of clinically significant depression in the moderate range, characterized by mild to moderate feelings of failure, anhedonia, loss of self confidence, self-criticalness, tearfulness, restlessness, reduced interest, indecisiveness, feelings of worthlessness, reduced sleep, irritability, reduced appetite, concentration difficulty, fatigue and reduced libido. She endorsed passive suicidal ideation but denied intention or plan.   On a more extensive measure of psychopathology and personality functioning (PAI), validity indicators fell in the normal range, suggesting that she answered in a forthright manner and did not attempt to present an unrealistic or inaccurate impression that was either  more negative or more positive than the clinical picture would warrant.  Her PAI clinical profile is marked by a significant elevation on the SCZ scale, indicating that the content tapped by this scale may reflect a particular area of difficulty for the patient. A number of aspects of the patient's self-description suggest noteworthy peculiarities in thinking and experience.  Her pattern of responses suggests that her thought processes are likely to be marked by confusion, indecision, distractibility, and difficulty concentrating.  She may experience her thoughts as being somehow blocked or disrupted.  It should be noted that this finding can reflect various causes outside of a schizophrenic disorder.  Active psychotic symptoms such as hallucinations or delusions do not appear to be a prominent part of the clinical picture at this time. According to the patient's self-report, she describes NO significant  problems in the following areas: antisocial behavior; problems with empathy; extreme moodiness and impulsivity; unhappiness and depression; unusually elevated mood or heightened activity; marked anxiety; problematic behaviors used to manage anxiety; difficulties with health or physical functioning.  Also, she reports NO significant problems with alcohol or drug abuse or dependence.  With respect to suicidal ideation, the patient is NOT reporting distress from thoughts of self-harm. The self-concept of the patient appears to involve a rather negative self-evaluation.  She is likely to be self-critical, not handling setbacks very well and blaming herself for past failures and lost opportunities.  She may inwardly be more troubled by self-doubt and misgivings about her adequacy than is apparent on the surface.  She may tend to play down her successes as a result and probably sees such accomplishments as heavily depending on the efforts or good will of others.  Clinical Impressions: Persisting post-concussion syndrome; Major depressive disorder, moderate.  Results of cognitive testing are generally within normal limits and consistent with those of her previous evaluation in 2016. Relative weaknesses, which may represent mild decrements from premorbid functioning, are noted in psychomotor processing speed and initial encoding of non-contextual information (although the latter is significantly improved by repetition of to be learned information). On psychological testing, she reported significant depression as well as a very high level of concern about her cognitive abilities and thought processes.  The constellation of subjective cognitive complaints, physiological symptoms and emotional changes following her concussion is consistent with a diagnosis of persisting post-concussion syndrome (PCS). She also meets diagnostic criteria for major depressive disorder, single episode. Symptoms of depression are likely  contributing to her functional cognitive complaints. Therefore, I am hopeful that intervention for depression, as well as education regarding PCS, will improve her overall wellbeing.   Recommendations/Plan: 1. Treatment with an antidepressant (e.g., SSRI) is recommended, pending physician approval. 2. Cognitive behavioral therapy targeted at post-concussion syndrome symptoms, with a psychologist, is also highly recommended. [Her therapist should see Lannie Fields., & Owens Shark, R. (2012). Cognitive behavioural therapy and persistent post-concussional symptoms: Integrating conceptual issues and practical aspects in treatment. Neuropsychological Rehabilitation, 22(1), 1-25.] If the patient is amenable, we can refer her to [facility] or [facility].  3. The patient will be educated regarding PCS and the importance of treating depression/anxiety and improving coping skills for other symptoms. She will be provided with detailed, written information regarding PCS.  4. The patient would like a letter requesting accommodations from the [place] for her upcoming dressage competition. I have deferred this to Dr. Tomi Likens, since I believe they will require a letter from an MD/DO.   Feedback to Patient: Nicole Waller and her husband returned for a feedback appointment on 05/02/2016 to review the results of her neuropsychological evaluation with this provider. 40 minutes face-to-face time was spent reviewing her test results, my impressions and my recommendations as detailed above.    Total time spent on this patient's case: [id number] unit for interview with psychologist; [phone] units of testing by psychometrician under psychologist's supervision; [phone] units for medical record review, administration and scoring of neuropsychological tests, interpretation of test results, preparation of this report, and review of results to the patient by psychologist.     Thank you for your  referral of Nicole Waller. Please feel free to contact me if you have any questions or concerns regarding this report.

## 2016-05-13 ENCOUNTER — Ambulatory Visit (INDEPENDENT_AMBULATORY_CARE_PROVIDER_SITE_OTHER): Payer: 59 | Admitting: Neurology

## 2016-05-13 ENCOUNTER — Encounter: Payer: Self-pay | Admitting: Neurology

## 2016-05-13 VITALS — BP 120/70 | HR 83 | Ht 67.5 in | Wt 156.1 lb

## 2016-05-13 DIAGNOSIS — F0781 Postconcussional syndrome: Secondary | ICD-10-CM | POA: Diagnosis not present

## 2016-05-13 NOTE — Progress Notes (Signed)
NEUROLOGY FOLLOW UP OFFICE NOTE  [name] [place]:[id number]  HISTORY OF PRESENT ILLNESS: Nicole Waller is a 50 year old right-handed female who follows up for persisting post-concussion syndrome.  She is accompanied by her husband.  UPDATE: She underwent neuropsychological testing on 05/02/16.  Results of testing generally fell within normal limits and did not demonstrate cognitive impairment.  She did demonstrate mild decrements in psychomotor processing speed.  Initially she demonstrated mild decrement in encoding of non-contextual information, this significantly improved by repetition of the learned information.    Psychological testing demonstrated moderate major depressive disorder as well as high level of concern regarding her cognitive abilities.  HISTORY: She is a horse rider.  On 04/13/13, she sustained a concussion when her horse became agitated and knocked her on the right side of her head and face.  She denied loss of consciousness.  She sustained swelling around her right eye and nose bleed.  She went to the ED at [facility] for further evaluation.  CT of head and maxillofacial were personally reviewed and revealed no acute intracranial abnormalities or fracture.  Due to persistent headache, she had follow up imaging performed that year.  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  About 4 to 6 weeks after the injury, she began to experience cognitive problems.  She started having short-term memory deficits, as well as word-finding problems.  Initially, she was treated for postconcussion syndrome and prescribed gabapentin for headache.  She developed blurred vision in her right eye and was told by her eye doctor that her eyes were slightly misaligned.  She later had an MRI of the brain with and without contrast performed on 05/16/13, which again demonstrated the incidental lipoma but no evidence of brain  injury.  She underwent neuro-feedback.  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  She was initially on Adderall XR, but was switched to Adzenys due to side effects.  She subsequently underwent neuropsychological testing at [facility] on 07/07/15.  Test results revealed average or above average performance across most cognitive domains and thinking skills, with only exceptions being mildly slowed mentation thought to be secondary to mood and possibly certain medication side effects.  Testing did not demonstrate cognitive disorder and was not consistent with any organic cognitive impairment that would be explained from her head trauma.    She reports short-term memory problems such as often misplacing things.  She would second guess herself about whether she had locked the door or turned off the lights before she left.  She often will repeat questions, even just 10 minutes later. If she is performing a task and is interrupted, she will forget what she was doing.  She also developed longer term memory problems as well.  Once, she got lost while driving just 2 miles from home.  She has trouble recognizing colleagues she works with.  Following the injury, she had to relearn to tie her shoes and ride a horse.  She struggles to remember how to perform the riding test in order to qualify to compete in the nationals.  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  She needs to constantly take notes.  She needs to use a calculator 100% of the time.  She has not made any consequential mistakes at work that have jeopardized her job.    She  also reports both word-finding difficulty and trouble getting words out.  She knows sign language.  Initially, she started signing when she couldn't communicate verbally.  She reports change in personality and behavior.  She had developed attention deficit symptoms.  Since the accident, symptoms have  been stable, if not only slightly improved.  She takes B12 supplement.  PAST MEDICAL HISTORY: Past Medical History  Diagnosis Date  . Dyspnea   . Abnormal EKG   . Abnormal chest CT   . Abdominal pain   . Chronic headache   . Chronic cough   . Anxiety   . Hx of migraines   . Rosacea   . Pulmonary nodule 2011  . History of nephrolithiasis   . Exercise-induced asthma     MEDICATIONS: Current Outpatient Prescriptions on File Prior to Visit  Medication Sig Dispense Refill  . albuterol (PROAIR HFA) 108 (90 BASE) MCG/ACT inhaler 2 puffs every 4 hours as needed only  if your can't catch your breath 1 Inhaler 1  . ALPRAZolam (XANAX) 0.5 MG tablet Take 1/2 to 1 tablet 3 x / day if need for Anxiety 90 tablet 5  . Amphetamine ER (ADZENYS XR-ODT) 9.4 MG TBED Take 1 tablet by mouth daily.    Marland Kitchen aspirin 81 MG tablet Take 81 mg by mouth every other day.    . B Complex Vitamins (B COMPLEX-B12) TABS Take 1 tablet by mouth daily.    . Calcium 600-200 MG-UNIT per tablet Take 1 tablet by mouth daily.      . cetirizine (ZYRTEC) 10 MG tablet Take 10 mg by mouth daily.    . cholecalciferol (VITAMIN D) 1000 UNITS tablet 1 tablet daily    . Coenzyme Q10 (COQ10) 100 MG CAPS Take by mouth daily.    . IMITREX 100 MG tablet TAKE ONE TABLET NOW FOR MIGRAINE. MAY REPEAT DOSE ONCE IN 2 HOURS. MAX 2 TABS/24 HOURS 9 tablet 11  . MAGNESIUM GLYCINATE PLUS PO Take 400 mg by mouth 2 (two) times daily.    . montelukast (SINGULAIR) 10 MG tablet TAKE 1 TABLET BY MOUTH EVERY DAY 90 tablet 1  . Multiple Vitamin (MULTIVITAMIN) capsule Take 1 capsule by mouth daily.      . norgestrel-ethinyl estradiol (LO/OVRAL) 0.3-30 MG-MCG per tablet Take 1 tablet by mouth daily.      Marland Kitchen QVAR 80 MCG/ACT inhaler TAKE 2 PUFFS TWICE DAILY 8.7 g 11  . SUMAtriptan (IMITREX) 20 MG/ACT nasal spray Place 20 mg into the nose every 2 (two) hours as needed for migraine or headache. May repeat in 2 hours if headache persists or recurs.    .  valACYclovir (VALTREX) 500 MG tablet Take 500 mg by mouth daily as needed (for outbreak).      No current facility-administered medications on file prior to visit.    ALLERGIES: Allergies  Allergen Reactions  . Accolate [Zafirlukast]     GI Upset  . Codeine     REACTION: Reaction not known  . Lyrica [Pregabalin]     Fatigue  . Oxycodone   . Probiotic [Acidophilus]     Headache    FAMILY HISTORY: Family History  Problem Relation Age of Onset  . Breast cancer Maternal Grandmother   . Cancer Maternal Grandmother 59    Breast,uterine,ovarian  . Colon polyps Mother   . Diabetes Mother   . Rheum arthritis Mother   . Lupus Mother   . Colon polyps Father   . Colitis Father   . Heart disease  Father   . Hypertension Father   . Hyperlipidemia Father   . Diabetes Paternal Grandfather     SOCIAL HISTORY: Social History   Social History  . Marital Status: Married    Spouse Name: N/A  . Number of Children: N/A  . Years of Education: N/A   Occupational History  . Not on file.   Social History Main Topics  . Smoking status: Never Smoker   . Smokeless tobacco: Never Used  . Alcohol Use: No  . Drug Use: No  . Sexual Activity: Not on file   Other Topics Concern  . Not on file   Social History Narrative    REVIEW OF SYSTEMS: Constitutional: No fevers, chills, or sweats, no generalized fatigue, change in appetite Eyes: No visual changes, double vision, eye pain Ear, nose and throat: No hearing loss, ear pain, nasal congestion, sore throat Cardiovascular: No chest pain, palpitations Respiratory:  No shortness of breath at rest or with exertion, wheezes GastrointestinaI: No nausea, vomiting, diarrhea, abdominal pain, fecal incontinence Genitourinary:  No dysuria, urinary retention or frequency Musculoskeletal:  No neck pain, back pain Integumentary: No rash, pruritus, skin lesions Neurological: as above Psychiatric: No depression, insomnia, anxiety Endocrine: No  palpitations, fatigue, diaphoresis, mood swings, change in appetite, change in weight, increased thirst Hematologic/Lymphatic:  No purpura, petechiae. Allergic/Immunologic: no itchy/runny eyes, nasal congestion, recent allergic reactions, rashes  PHYSICAL EXAM: Filed Vitals:   05/13/16 1315  BP: 120/70  Pulse: 83   General: No acute distress.  Patient appears well-groomed.  normal body habitus. Head:  Normocephalic/atraumatic  IMPRESSION: Persisting postconcussion syndrome  PLAN: 1.  Cognitive behavioral therapy is recommended.  She will see Amada Jupiter for neuropsychological rehabilitation. 2.  Recommend seeing a psychiatrist for medication management.  She is taking Adderal, which may not be indicated as testing did not demonstrate ADHD.  An SSRI with neuropsych rehab may be all that is needed. 3.  Follow up as needed.  15 minutes spent face to face with patient, 100% spent discussing neuropsych results and management.  Metta Clines, DO  CC:  Unk Pinto, MD

## 2016-06-10 ENCOUNTER — Other Ambulatory Visit: Payer: Self-pay | Admitting: Internal Medicine

## 2016-06-21 NOTE — Progress Notes (Signed)
   Neuropsychology Note  [name] returned on 04/25/2016 for 3 hours of neuropsychological testing with technician, Milana Kidney, BS, under the supervision of Dr. Macarthur Critchley. The patient did not appear overtly distressed by the testing session, per behavioral observation or via self-report to the technician. Rest breaks were offered. Nicole Waller will return within 2 weeks for a feedback session with Dr. Si Raider at which time her test performances, clinical impressions and treatment recommendations will be reviewed in detail. The patient understands she can contact our office should she require our assistance before this time.  Full report to follow.

## 2016-06-27 NOTE — Progress Notes (Signed)
   Neuropsychology Note  Nicole Waller returned today for 3 hours of neuropsychological testing with technician, Milana Kidney, BS, under the supervision of Dr. Macarthur Critchley. The patient did not appear overtly distressed by the testing session, per behavioral observation or via self-report to the technician. Rest breaks were offered. Nicole Waller will return within 2 weeks for a feedback session with Dr. Si Raider at which time her test performances, clinical impressions and treatment recommendations will be reviewed in detail. The patient understands she can contact our office should she require our assistance before this time.  Full report to follow.

## 2016-07-02 ENCOUNTER — Other Ambulatory Visit: Payer: Self-pay | Admitting: Internal Medicine

## 2016-11-20 DIAGNOSIS — S069X9S Unspecified intracranial injury with loss of consciousness of unspecified duration, sequela: Secondary | ICD-10-CM | POA: Diagnosis not present

## 2016-11-20 DIAGNOSIS — Z79899 Other long term (current) drug therapy: Secondary | ICD-10-CM | POA: Diagnosis not present

## 2016-12-01 ENCOUNTER — Other Ambulatory Visit: Payer: Self-pay | Admitting: Internal Medicine

## 2016-12-03 DIAGNOSIS — M7712 Lateral epicondylitis, left elbow: Secondary | ICD-10-CM | POA: Diagnosis not present

## 2016-12-24 DIAGNOSIS — M7712 Lateral epicondylitis, left elbow: Secondary | ICD-10-CM | POA: Diagnosis not present

## 2016-12-28 DIAGNOSIS — M25522 Pain in left elbow: Secondary | ICD-10-CM | POA: Diagnosis not present

## 2017-01-06 DIAGNOSIS — M7712 Lateral epicondylitis, left elbow: Secondary | ICD-10-CM | POA: Diagnosis not present

## 2017-01-06 DIAGNOSIS — S56512A Strain of other extensor muscle, fascia and tendon at forearm level, left arm, initial encounter: Secondary | ICD-10-CM | POA: Diagnosis not present

## 2017-01-06 DIAGNOSIS — G8918 Other acute postprocedural pain: Secondary | ICD-10-CM | POA: Diagnosis not present

## 2017-01-14 DIAGNOSIS — M7712 Lateral epicondylitis, left elbow: Secondary | ICD-10-CM | POA: Diagnosis not present

## 2017-02-20 DIAGNOSIS — Z79899 Other long term (current) drug therapy: Secondary | ICD-10-CM | POA: Diagnosis not present

## 2017-02-20 DIAGNOSIS — S069X9S Unspecified intracranial injury with loss of consciousness of unspecified duration, sequela: Secondary | ICD-10-CM | POA: Diagnosis not present

## 2017-02-21 DIAGNOSIS — M25522 Pain in left elbow: Secondary | ICD-10-CM | POA: Diagnosis not present

## 2017-02-21 DIAGNOSIS — M7712 Lateral epicondylitis, left elbow: Secondary | ICD-10-CM | POA: Diagnosis not present

## 2017-02-21 DIAGNOSIS — S53432D Radial collateral ligament sprain of left elbow, subsequent encounter: Secondary | ICD-10-CM | POA: Diagnosis not present

## 2017-02-25 DIAGNOSIS — S53432D Radial collateral ligament sprain of left elbow, subsequent encounter: Secondary | ICD-10-CM | POA: Diagnosis not present

## 2017-02-25 DIAGNOSIS — M25522 Pain in left elbow: Secondary | ICD-10-CM | POA: Diagnosis not present

## 2017-02-25 DIAGNOSIS — M7712 Lateral epicondylitis, left elbow: Secondary | ICD-10-CM | POA: Diagnosis not present

## 2017-02-27 DIAGNOSIS — M7712 Lateral epicondylitis, left elbow: Secondary | ICD-10-CM | POA: Diagnosis not present

## 2017-02-27 DIAGNOSIS — M25522 Pain in left elbow: Secondary | ICD-10-CM | POA: Diagnosis not present

## 2017-02-27 DIAGNOSIS — S53432D Radial collateral ligament sprain of left elbow, subsequent encounter: Secondary | ICD-10-CM | POA: Diagnosis not present

## 2017-02-28 DIAGNOSIS — M7712 Lateral epicondylitis, left elbow: Secondary | ICD-10-CM | POA: Diagnosis not present

## 2017-02-28 DIAGNOSIS — S53432D Radial collateral ligament sprain of left elbow, subsequent encounter: Secondary | ICD-10-CM | POA: Diagnosis not present

## 2017-02-28 DIAGNOSIS — M25522 Pain in left elbow: Secondary | ICD-10-CM | POA: Diagnosis not present

## 2017-03-03 DIAGNOSIS — M25522 Pain in left elbow: Secondary | ICD-10-CM | POA: Diagnosis not present

## 2017-03-03 DIAGNOSIS — M7712 Lateral epicondylitis, left elbow: Secondary | ICD-10-CM | POA: Diagnosis not present

## 2017-03-03 DIAGNOSIS — S53432D Radial collateral ligament sprain of left elbow, subsequent encounter: Secondary | ICD-10-CM | POA: Diagnosis not present

## 2017-03-05 DIAGNOSIS — S56512D Strain of other extensor muscle, fascia and tendon at forearm level, left arm, subsequent encounter: Secondary | ICD-10-CM | POA: Diagnosis not present

## 2017-03-05 DIAGNOSIS — M7712 Lateral epicondylitis, left elbow: Secondary | ICD-10-CM | POA: Diagnosis not present

## 2017-03-05 DIAGNOSIS — S53432D Radial collateral ligament sprain of left elbow, subsequent encounter: Secondary | ICD-10-CM | POA: Diagnosis not present

## 2017-03-07 DIAGNOSIS — S53432D Radial collateral ligament sprain of left elbow, subsequent encounter: Secondary | ICD-10-CM | POA: Diagnosis not present

## 2017-03-07 DIAGNOSIS — M25522 Pain in left elbow: Secondary | ICD-10-CM | POA: Diagnosis not present

## 2017-03-07 DIAGNOSIS — M7712 Lateral epicondylitis, left elbow: Secondary | ICD-10-CM | POA: Diagnosis not present

## 2017-03-11 DIAGNOSIS — M25522 Pain in left elbow: Secondary | ICD-10-CM | POA: Diagnosis not present

## 2017-03-11 DIAGNOSIS — M7712 Lateral epicondylitis, left elbow: Secondary | ICD-10-CM | POA: Diagnosis not present

## 2017-03-11 DIAGNOSIS — S53432D Radial collateral ligament sprain of left elbow, subsequent encounter: Secondary | ICD-10-CM | POA: Diagnosis not present

## 2017-03-14 DIAGNOSIS — S53432D Radial collateral ligament sprain of left elbow, subsequent encounter: Secondary | ICD-10-CM | POA: Diagnosis not present

## 2017-03-14 DIAGNOSIS — M7712 Lateral epicondylitis, left elbow: Secondary | ICD-10-CM | POA: Diagnosis not present

## 2017-03-14 DIAGNOSIS — S56512D Strain of other extensor muscle, fascia and tendon at forearm level, left arm, subsequent encounter: Secondary | ICD-10-CM | POA: Diagnosis not present

## 2017-03-17 DIAGNOSIS — S56512D Strain of other extensor muscle, fascia and tendon at forearm level, left arm, subsequent encounter: Secondary | ICD-10-CM | POA: Diagnosis not present

## 2017-03-17 DIAGNOSIS — M7712 Lateral epicondylitis, left elbow: Secondary | ICD-10-CM | POA: Diagnosis not present

## 2017-03-17 DIAGNOSIS — S53432D Radial collateral ligament sprain of left elbow, subsequent encounter: Secondary | ICD-10-CM | POA: Diagnosis not present

## 2017-03-19 DIAGNOSIS — M7712 Lateral epicondylitis, left elbow: Secondary | ICD-10-CM | POA: Diagnosis not present

## 2017-03-19 DIAGNOSIS — M25522 Pain in left elbow: Secondary | ICD-10-CM | POA: Diagnosis not present

## 2017-03-19 DIAGNOSIS — S53432D Radial collateral ligament sprain of left elbow, subsequent encounter: Secondary | ICD-10-CM | POA: Diagnosis not present

## 2017-03-21 DIAGNOSIS — S53432D Radial collateral ligament sprain of left elbow, subsequent encounter: Secondary | ICD-10-CM | POA: Diagnosis not present

## 2017-03-21 DIAGNOSIS — M25522 Pain in left elbow: Secondary | ICD-10-CM | POA: Diagnosis not present

## 2017-03-21 DIAGNOSIS — M7712 Lateral epicondylitis, left elbow: Secondary | ICD-10-CM | POA: Diagnosis not present

## 2017-03-24 DIAGNOSIS — M7712 Lateral epicondylitis, left elbow: Secondary | ICD-10-CM | POA: Diagnosis not present

## 2017-03-24 DIAGNOSIS — M25522 Pain in left elbow: Secondary | ICD-10-CM | POA: Diagnosis not present

## 2017-03-24 DIAGNOSIS — S53432D Radial collateral ligament sprain of left elbow, subsequent encounter: Secondary | ICD-10-CM | POA: Diagnosis not present

## 2017-03-26 DIAGNOSIS — S53432D Radial collateral ligament sprain of left elbow, subsequent encounter: Secondary | ICD-10-CM | POA: Diagnosis not present

## 2017-03-26 DIAGNOSIS — M7712 Lateral epicondylitis, left elbow: Secondary | ICD-10-CM | POA: Diagnosis not present

## 2017-03-26 DIAGNOSIS — M25522 Pain in left elbow: Secondary | ICD-10-CM | POA: Diagnosis not present

## 2017-03-28 DIAGNOSIS — M7712 Lateral epicondylitis, left elbow: Secondary | ICD-10-CM | POA: Diagnosis not present

## 2017-03-28 DIAGNOSIS — S53432D Radial collateral ligament sprain of left elbow, subsequent encounter: Secondary | ICD-10-CM | POA: Diagnosis not present

## 2017-03-28 DIAGNOSIS — M25522 Pain in left elbow: Secondary | ICD-10-CM | POA: Diagnosis not present

## 2017-04-01 DIAGNOSIS — S53432D Radial collateral ligament sprain of left elbow, subsequent encounter: Secondary | ICD-10-CM | POA: Diagnosis not present

## 2017-04-01 DIAGNOSIS — M25522 Pain in left elbow: Secondary | ICD-10-CM | POA: Diagnosis not present

## 2017-04-01 DIAGNOSIS — H0015 Chalazion left lower eyelid: Secondary | ICD-10-CM | POA: Diagnosis not present

## 2017-04-01 DIAGNOSIS — M7712 Lateral epicondylitis, left elbow: Secondary | ICD-10-CM | POA: Diagnosis not present

## 2017-04-03 DIAGNOSIS — M25522 Pain in left elbow: Secondary | ICD-10-CM | POA: Diagnosis not present

## 2017-04-03 DIAGNOSIS — M7712 Lateral epicondylitis, left elbow: Secondary | ICD-10-CM | POA: Diagnosis not present

## 2017-04-03 DIAGNOSIS — S53432D Radial collateral ligament sprain of left elbow, subsequent encounter: Secondary | ICD-10-CM | POA: Diagnosis not present

## 2017-04-04 ENCOUNTER — Other Ambulatory Visit: Payer: Self-pay | Admitting: Internal Medicine

## 2017-04-08 DIAGNOSIS — M25522 Pain in left elbow: Secondary | ICD-10-CM | POA: Diagnosis not present

## 2017-04-08 DIAGNOSIS — S56512D Strain of other extensor muscle, fascia and tendon at forearm level, left arm, subsequent encounter: Secondary | ICD-10-CM | POA: Diagnosis not present

## 2017-04-08 DIAGNOSIS — S53432D Radial collateral ligament sprain of left elbow, subsequent encounter: Secondary | ICD-10-CM | POA: Diagnosis not present

## 2017-04-10 DIAGNOSIS — S53432D Radial collateral ligament sprain of left elbow, subsequent encounter: Secondary | ICD-10-CM | POA: Diagnosis not present

## 2017-04-10 DIAGNOSIS — M7712 Lateral epicondylitis, left elbow: Secondary | ICD-10-CM | POA: Diagnosis not present

## 2017-04-10 DIAGNOSIS — M25522 Pain in left elbow: Secondary | ICD-10-CM | POA: Diagnosis not present

## 2017-04-11 ENCOUNTER — Other Ambulatory Visit: Payer: Self-pay | Admitting: Physician Assistant

## 2017-04-15 DIAGNOSIS — Z6824 Body mass index (BMI) 24.0-24.9, adult: Secondary | ICD-10-CM | POA: Diagnosis not present

## 2017-04-15 DIAGNOSIS — Z01419 Encounter for gynecological examination (general) (routine) without abnormal findings: Secondary | ICD-10-CM | POA: Diagnosis not present

## 2017-04-16 DIAGNOSIS — M25522 Pain in left elbow: Secondary | ICD-10-CM | POA: Diagnosis not present

## 2017-04-16 DIAGNOSIS — M7712 Lateral epicondylitis, left elbow: Secondary | ICD-10-CM | POA: Diagnosis not present

## 2017-04-16 DIAGNOSIS — S53432D Radial collateral ligament sprain of left elbow, subsequent encounter: Secondary | ICD-10-CM | POA: Diagnosis not present

## 2017-04-17 ENCOUNTER — Encounter: Payer: Self-pay | Admitting: Internal Medicine

## 2017-04-18 DIAGNOSIS — M7712 Lateral epicondylitis, left elbow: Secondary | ICD-10-CM | POA: Diagnosis not present

## 2017-04-18 DIAGNOSIS — M25522 Pain in left elbow: Secondary | ICD-10-CM | POA: Diagnosis not present

## 2017-04-18 DIAGNOSIS — S53432D Radial collateral ligament sprain of left elbow, subsequent encounter: Secondary | ICD-10-CM | POA: Diagnosis not present

## 2017-04-21 DIAGNOSIS — Z4789 Encounter for other orthopedic aftercare: Secondary | ICD-10-CM | POA: Diagnosis not present

## 2017-04-21 DIAGNOSIS — M7712 Lateral epicondylitis, left elbow: Secondary | ICD-10-CM | POA: Diagnosis not present

## 2017-04-21 DIAGNOSIS — M25522 Pain in left elbow: Secondary | ICD-10-CM | POA: Diagnosis not present

## 2017-04-21 DIAGNOSIS — S53432D Radial collateral ligament sprain of left elbow, subsequent encounter: Secondary | ICD-10-CM | POA: Diagnosis not present

## 2017-04-23 ENCOUNTER — Other Ambulatory Visit: Payer: Self-pay | Admitting: Obstetrics and Gynecology

## 2017-04-23 DIAGNOSIS — Z803 Family history of malignant neoplasm of breast: Secondary | ICD-10-CM

## 2017-04-24 DIAGNOSIS — M7712 Lateral epicondylitis, left elbow: Secondary | ICD-10-CM | POA: Diagnosis not present

## 2017-04-24 DIAGNOSIS — M25522 Pain in left elbow: Secondary | ICD-10-CM | POA: Diagnosis not present

## 2017-04-24 DIAGNOSIS — S53432D Radial collateral ligament sprain of left elbow, subsequent encounter: Secondary | ICD-10-CM | POA: Diagnosis not present

## 2017-04-28 DIAGNOSIS — M7712 Lateral epicondylitis, left elbow: Secondary | ICD-10-CM | POA: Diagnosis not present

## 2017-04-28 DIAGNOSIS — M25522 Pain in left elbow: Secondary | ICD-10-CM | POA: Diagnosis not present

## 2017-04-28 DIAGNOSIS — S53432D Radial collateral ligament sprain of left elbow, subsequent encounter: Secondary | ICD-10-CM | POA: Diagnosis not present

## 2017-04-29 DIAGNOSIS — M7712 Lateral epicondylitis, left elbow: Secondary | ICD-10-CM | POA: Diagnosis not present

## 2017-05-02 DIAGNOSIS — Z13820 Encounter for screening for osteoporosis: Secondary | ICD-10-CM | POA: Diagnosis not present

## 2017-05-05 DIAGNOSIS — M7712 Lateral epicondylitis, left elbow: Secondary | ICD-10-CM | POA: Diagnosis not present

## 2017-05-05 DIAGNOSIS — S53432D Radial collateral ligament sprain of left elbow, subsequent encounter: Secondary | ICD-10-CM | POA: Diagnosis not present

## 2017-05-05 DIAGNOSIS — M25522 Pain in left elbow: Secondary | ICD-10-CM | POA: Diagnosis not present

## 2017-05-07 DIAGNOSIS — M7712 Lateral epicondylitis, left elbow: Secondary | ICD-10-CM | POA: Diagnosis not present

## 2017-05-07 DIAGNOSIS — M25522 Pain in left elbow: Secondary | ICD-10-CM | POA: Diagnosis not present

## 2017-05-07 DIAGNOSIS — S53432D Radial collateral ligament sprain of left elbow, subsequent encounter: Secondary | ICD-10-CM | POA: Diagnosis not present

## 2017-05-08 ENCOUNTER — Ambulatory Visit
Admission: RE | Admit: 2017-05-08 | Discharge: 2017-05-08 | Disposition: A | Discharge status: Home / Self Care | Payer: 59 | Source: Ambulatory Visit | Attending: Obstetrics and Gynecology | Admitting: Obstetrics and Gynecology

## 2017-05-08 DIAGNOSIS — Z803 Family history of malignant neoplasm of breast: Secondary | ICD-10-CM

## 2017-05-08 DIAGNOSIS — N6312 Unspecified lump in the right breast, upper inner quadrant: Secondary | ICD-10-CM | POA: Diagnosis not present

## 2017-05-08 MED ORDER — GADOBENATE DIMEGLUMINE 529 MG/ML IV SOLN
14.0000 mL | Freq: Once | INTRAVENOUS | Status: AC | PRN
  Administered 2017-05-08: 14 mL via INTRAVENOUS

## 2017-05-12 ENCOUNTER — Other Ambulatory Visit: Payer: Self-pay | Admitting: *Deleted

## 2017-05-12 ENCOUNTER — Other Ambulatory Visit: Payer: Self-pay | Admitting: Obstetrics and Gynecology

## 2017-05-12 ENCOUNTER — Ambulatory Visit (INDEPENDENT_AMBULATORY_CARE_PROVIDER_SITE_OTHER): Payer: 59 | Admitting: Internal Medicine

## 2017-05-12 ENCOUNTER — Encounter: Payer: Self-pay | Admitting: Internal Medicine

## 2017-05-12 VITALS — BP 118/76 | HR 68 | Temp 97.5°F | Resp 16 | Ht 68.25 in | Wt 158.6 lb

## 2017-05-12 DIAGNOSIS — Z136 Encounter for screening for cardiovascular disorders: Secondary | ICD-10-CM

## 2017-05-12 DIAGNOSIS — R030 Elevated blood-pressure reading, without diagnosis of hypertension: Secondary | ICD-10-CM

## 2017-05-12 DIAGNOSIS — E7800 Pure hypercholesterolemia, unspecified: Secondary | ICD-10-CM

## 2017-05-12 DIAGNOSIS — Z79899 Other long term (current) drug therapy: Secondary | ICD-10-CM

## 2017-05-12 DIAGNOSIS — M25522 Pain in left elbow: Secondary | ICD-10-CM | POA: Diagnosis not present

## 2017-05-12 DIAGNOSIS — Z1212 Encounter for screening for malignant neoplasm of rectum: Secondary | ICD-10-CM

## 2017-05-12 DIAGNOSIS — R5383 Other fatigue: Secondary | ICD-10-CM

## 2017-05-12 DIAGNOSIS — R7309 Other abnormal glucose: Secondary | ICD-10-CM

## 2017-05-12 DIAGNOSIS — M7712 Lateral epicondylitis, left elbow: Secondary | ICD-10-CM | POA: Diagnosis not present

## 2017-05-12 DIAGNOSIS — Z0000 Encounter for general adult medical examination without abnormal findings: Secondary | ICD-10-CM | POA: Diagnosis not present

## 2017-05-12 DIAGNOSIS — Z23 Encounter for immunization: Secondary | ICD-10-CM

## 2017-05-12 DIAGNOSIS — R928 Other abnormal and inconclusive findings on diagnostic imaging of breast: Secondary | ICD-10-CM

## 2017-05-12 DIAGNOSIS — Z111 Encounter for screening for respiratory tuberculosis: Secondary | ICD-10-CM | POA: Diagnosis not present

## 2017-05-12 DIAGNOSIS — E559 Vitamin D deficiency, unspecified: Secondary | ICD-10-CM

## 2017-05-12 DIAGNOSIS — S56512D Strain of other extensor muscle, fascia and tendon at forearm level, left arm, subsequent encounter: Secondary | ICD-10-CM | POA: Diagnosis not present

## 2017-05-12 DIAGNOSIS — Z0001 Encounter for general adult medical examination with abnormal findings: Secondary | ICD-10-CM

## 2017-05-12 LAB — LIPID PANEL
CHOLESTEROL: 172 mg/dL (ref <200)
HDL: 72 mg/dL (ref >50)
LDL CALC: 83 mg/dL (ref <100)
TRIGLYCERIDES: 83 mg/dL (ref <150)
Total CHOL/HDL Ratio: 2.4 Ratio (ref <5.0)
VLDL: 17 mg/dL (ref <30)

## 2017-05-12 LAB — BASIC METABOLIC PANEL WITH GFR
BUN: 14 mg/dL (ref 7–25)
CO2: 24 mmol/L (ref 20–31)
Calcium: 9.1 mg/dL (ref 8.6–10.4)
Chloride: 104 mmol/L (ref 98–110)
Creat: 0.96 mg/dL (ref 0.50–1.05)
GFR, EST AFRICAN AMERICAN: 80 mL/min (ref >=60)
GFR, EST NON AFRICAN AMERICAN: 69 mL/min (ref >=60)
Glucose, Bld: 85 mg/dL (ref 65–99)
POTASSIUM: 4.7 mmol/L (ref 3.5–5.3)
Sodium: 137 mmol/L (ref 135–146)

## 2017-05-12 LAB — MAGNESIUM: Magnesium: 2 mg/dL (ref 1.5–2.5)

## 2017-05-12 LAB — CBC WITH DIFFERENTIAL/PLATELET
BASOS PCT: 1 %
Basophils Absolute: 60 cells/uL (ref 0–200)
Eosinophils Absolute: 60 cells/uL (ref 15–500)
Eosinophils Relative: 1 %
HCT: 39.5 % (ref 35.0–45.0)
Hemoglobin: 13.1 g/dL (ref 11.7–15.5)
LYMPHS PCT: 26 %
Lymphs Abs: 1560 cells/uL (ref 850–3900)
MCH: 33.7 pg — ABNORMAL HIGH (ref 27.0–33.0)
MCHC: 33.2 g/dL (ref 32.0–36.0)
MCV: 101.5 fL — AB (ref 80.0–100.0)
MONOS PCT: 6 %
MPV: 9.9 fL (ref 7.5–12.5)
Monocytes Absolute: 360 cells/uL (ref 200–950)
Neutro Abs: 3960 cells/uL (ref 1500–7800)
Neutrophils Relative %: 66 %
PLATELETS: 292 10*3/uL (ref 140–400)
RBC: 3.89 MIL/uL (ref 3.80–5.10)
RDW: 13.6 % (ref 11.0–15.0)
WBC: 6 10*3/uL (ref 3.8–10.8)

## 2017-05-12 LAB — IRON AND TIBC
%SAT: 44 % (ref 11–50)
Iron: 160 ug/dL (ref 45–160)
TIBC: 365 ug/dL (ref 250–450)
UIBC: 205 ug/dL

## 2017-05-12 LAB — HEPATIC FUNCTION PANEL
ALBUMIN: 4 g/dL (ref 3.6–5.1)
ALK PHOS: 76 U/L (ref 33–130)
ALT: 19 U/L (ref 6–29)
AST: 19 U/L (ref 10–35)
Bilirubin, Direct: 0.1 mg/dL (ref <=0.2)
Indirect Bilirubin: 0.3 mg/dL (ref 0.2–1.2)
Total Bilirubin: 0.4 mg/dL (ref 0.2–1.2)
Total Protein: 6.6 g/dL (ref 6.1–8.1)

## 2017-05-12 LAB — TSH: TSH: 2.55 mIU/L

## 2017-05-12 MED ORDER — BECLOMETHASONE DIPROPIONATE 80 MCG/ACT IN AERS: INHALATION_SPRAY | RESPIRATORY_TRACT | 11 refills | Status: DC

## 2017-05-12 MED ORDER — IMITREX 20 MG/ACT NA SOLN: NASAL | 5 refills | Status: DC

## 2017-05-12 MED ORDER — IMITREX 100 MG PO TABS: ORAL_TABLET | ORAL | 11 refills | Status: DC

## 2017-05-12 MED ORDER — MONTELUKAST SODIUM 10 MG PO TABS: 10.0000 mg | ORAL_TABLET | Freq: Every day | ORAL | 1 refills | Status: DC

## 2017-05-12 MED ORDER — ALBUTEROL SULFATE HFA 108 (90 BASE) MCG/ACT IN AERS: INHALATION_SPRAY | RESPIRATORY_TRACT | 2 refills | Status: DC

## 2017-05-12 NOTE — Progress Notes (Signed)
[facility] Unk Pinto, M.D.      Uvaldo Bristle. Silverio Lay, P.A.-C [facility]                [address] [place], [place] [address] Telephone [phone] Telefax [phone]  Annual Screening/Preventative Visit & Comprehensive Evaluation &  Examination     This very nice 51 y.o.  MWF presents for a Screening/Preventative Visit & comprehensive evaluation and management of multiple medical co-morbidities.  The patient is screened for elevate BP,  Prediabetes, Hyperlipidemia and Vitamin D Deficiency.      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 Patient is followed by Dr Tomi Likens for post-concussive syndrome. Patient had negative Head CT scan & Brain MRI.  She reports difficulty with ST recall, dysnomia for  names and words, dyscalculia, R-L & spatial disorientation and difficuties with attention and staying focused on tasks.  Neuropsych testing in June 2017 was normal. Patient is treated by Dr Johnnye Sima for ADD.  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       Patient's BP has been controlled at other provider visits and patient denies any cardiac symptoms as chest pain, palpitations, shortness of breath, dizziness or ankle swelling. Today's BP is at goal - 118/76.      Patient's lipids are controlled with diet and last lipids were at goal: Lab Results  Component Value Date   CHOL 156 04/16/2016   HDL 79 04/16/2016   LDLCALC 62 04/16/2016   TRIG 77 04/16/2016   CHOLHDL 2.0 04/16/2016      Patient is screened expectantly for prediabetes and patient denies reactive hypoglycemic symptoms, visual  blurring, diabetic polys, or paresthesias. Last A1c was at goal: Lab Results  Component Value Date   HGBA1C 5.4 04/16/2016      Finally, patient has history of Vitamin D Deficiency and last Vitamin D was at goal: Lab Results  Component Value Date   VD25OH 61 04/16/2016   Current Outpatient Prescriptions on File Prior to Visit  Medication Sig  . albuterol (PROAIR HFA) 108 (90 BASE) MCG/ACT inhaler 2 puffs every 4 hours as needed only  if your can't catch your breath  . Amphetamine ER (ADZENYS XR-ODT) 9.4 MG TBED Take 1 tablet by mouth daily.  Marland Kitchen aspirin 81 MG tablet Take 81 mg by mouth every other day.  . B Complex Vitamins (B COMPLEX-B12) TABS Take 1 tablet by mouth daily.  . Calcium 600-200 MG-UNIT per tablet Take 1 tablet by mouth daily.    . cetirizine (ZYRTEC) 10 MG tablet Take 10 mg by mouth daily.  . cholecalciferol (VITAMIN D) 1000 UNITS tablet 1 tablet daily  . Coenzyme Q10 (COQ10) 100 MG CAPS Take by mouth daily.  . IMITREX 100 MG tablet TAKE ONE TABLET NOW FOR MIGRAINE. MAY REPEAT DOSE ONCE IN 2 HOURS. MAX 2 TABS/24 HOURS  . IMITREX 20 MG/ACT nasal spray SPRAY 1 SPRAY IMMEDIATELY FOR MIGRAINE AND MAY REPEAT 1 TIME IN 2 HOURS (MAX  2 DOSES/24 HOURS)  . MAGNESIUM GLYCINATE PLUS PO Take 400 mg by mouth 2 (two) times daily.  . montelukast (SINGULAIR) 10 MG tablet TAKE 1 TABLET BY MOUTH EVERY DAY  . Multiple Vitamin (MULTIVITAMIN) capsule Take 1 capsule by mouth daily.    . norgestrel-ethinyl estradiol (LO/OVRAL) 0.3-30 MG-MCG per tablet Take 1 tablet by mouth daily.    Marland Kitchen QVAR 80 MCG/ACT inhaler TAKE 2 PUFFS TWICE DAILY  . valACYclovir (VALTREX) 500 MG tablet Take 500 mg by mouth daily as needed (for outbreak).    No current facility-administered medications on file prior to visit.    Allergies  Allergen Reactions  . Accolate [Zafirlukast]     GI Upset  . Codeine     REACTION: Reaction not known  . Lyrica [Pregabalin]     Fatigue  . Oxycodone   . Probiotic [Acidophilus]      Headache   Past Medical History:  Diagnosis Date  . Abdominal pain   . Abnormal chest CT   . Abnormal EKG   . Anxiety   . Chronic cough   . Chronic headache   . Dyspnea   . Exercise-induced asthma   . History of nephrolithiasis   . Hx of migraines   . Pulmonary nodule 2011  . Rosacea    Health Maintenance  Topic Date Due  . HIV Screening  10/21/1981  . PAP SMEAR  10/22/1987  . MAMMOGRAM  10/21/2016  . COLONOSCOPY  10/21/2016  . TETANUS/TDAP  11/11/2016  . INFLUENZA VACCINE  06/11/2017   Immunization History  Administered Date(s) Administered  . Influenza Split 08/11/2014  . Influenza Whole 08/11/2012  . PPD Test 04/16/2016  . Pneumococcal-Unspecified 11/11/2006  . Td 11/11/2006   Past Surgical History:  Procedure Laterality Date  . ELBOW SURGERY Right   . FOOT SURGERY Left   . SHOULDER SURGERY Bilateral    Family History  Problem Relation Age of Onset  . Breast cancer Maternal Grandmother   . Cancer Maternal Grandmother 72       Breast,uterine,ovarian  . Colon polyps Mother   . Diabetes Mother   . Rheum arthritis Mother   . Lupus Mother   . Colon polyps Father   . Colitis Father   . Heart disease Father   . Hypertension Father   . Hyperlipidemia Father   . Diabetes Paternal Grandfather    Social History  Substance Use Topics  . Smoking status: Never Smoker  . Smokeless tobacco: Never Used  . Alcohol use No    ROS Constitutional: Denies fever, chills, weight loss/gain, headaches, insomnia,  night sweats, and change in appetite. Does c/o fatigue. Eyes: Denies redness, blurred vision, diplopia, discharge, itchy, watery eyes.  ENT: Denies discharge, congestion, post nasal drip, epistaxis, sore throat, earache, hearing loss, dental pain, Tinnitus, Vertigo, Sinus pain, snoring.  Cardio: Denies chest pain, palpitations, irregular heartbeat, syncope, dyspnea, diaphoresis, orthopnea, PND, claudication, edema Respiratory: denies cough, dyspnea, DOE, pleurisy,  hoarseness, laryngitis, wheezing.  Gastrointestinal: Denies dysphagia, heartburn, reflux, water brash, pain, cramps, nausea, vomiting, bloating, diarrhea, constipation, hematemesis, melena, hematochezia, jaundice, hemorrhoids Genitourinary: Denies dysuria, frequency, urgency, nocturia, hesitancy, discharge, hematuria, flank pain Breast: Breast lumps, nipple discharge, bleeding.  Musculoskeletal: Denies arthralgia, myalgia, stiffness, Jt. Swelling, pain, limp, and strain/sprain. Denies falls. Skin: Denies puritis, rash, hives, warts, acne, eczema, changing in skin lesion Neuro: No weakness, tremor, incoordination, spasms, paresthesia, pain Psychiatric: Denies confusion, memory loss, sensory loss. Denies Depression. Endocrine: Denies change in weight, skin, hair change, nocturia, and paresthesia,  diabetic polys, visual blurring, hyper / hypo glycemic episodes.  Heme/Lymph: No excessive bleeding, bruising, enlarged lymph nodes.  Physical Exam  BP 118/76   Pulse 68   Temp 97.5 F (36.4 C)   Resp 16   Ht 5' 8.25" (1.734 m)   Wt 158 lb 9.6 oz (71.9 kg)   BMI 23.94 kg/m   General Appearance: Well nourished, well groomed and in no apparent distress.  Eyes: PERRLA, EOMs, conjunctiva no swelling or erythema, normal fundi and vessels. Sinuses: No frontal/maxillary tenderness ENT/Mouth: EACs patent / TMs  nl. Nares clear without erythema, swelling, mucoid exudates. Oral hygiene is good. No erythema, swelling, or exudate. Tongue normal, non-obstructing. Tonsils not swollen or erythematous. Hearing normal.  Neck: Supple, thyroid normal. No bruits, nodes or JVD. Respiratory: Respiratory effort normal.  BS equal and clear bilateral without rales, rhonci, wheezing or stridor. Cardio: Heart sounds are normal with regular rate and rhythm and no murmurs, rubs or gallops. Peripheral pulses are normal and equal bilaterally without edema. No aortic or femoral bruits. Chest: symmetric with normal excursions  and percussion. Breasts: Symmetric, without lumps, nipple discharge, retractions, or fibrocystic changes.  Abdomen: Flat, soft with bowel sounds active. Nontender, no guarding, rebound, hernias, masses, or organomegaly.  Lymphatics: Non tender without lymphadenopathy.  Genitourinary:  Musculoskeletal: Full ROM all peripheral extremities, joint stability, 5/5 strength, and normal gait. Skin: Warm and dry without rashes, lesions, cyanosis, clubbing or  ecchymosis.  Neuro: Cranial nerves intact, reflexes equal bilaterally. Normal muscle tone, no cerebellar symptoms. Sensation intact.  Pysch: Alert and oriented X 3, normal affect, Insight and Judgment appropriate.   Assessment and Plan  1. Annual Preventative Screening Examination  2. Elevated BP without diagnosis of hypertension  - EKG 12-Lead - Urinalysis, Routine w reflex microscopic - Microalbumin / creatinine urine ratio - CBC with Differential/Platelet - BASIC METABOLIC PANEL WITH GFR - Magnesium - TSH  3. Elevated cholesterol, screening  - EKG 12-Lead - Hepatic function panel - Lipid panel - TSH  4. Abnormal glucose, screening  - EKG 12-Lead - Hemoglobin A1c - Insulin, random  5. Vitamin D deficiency  - VITAMIN D 25 Hydroxy  6. Screening for ischemic heart disease  - EKG 12-Lead  7. Screening for rectal cancer  - POC Hemoccult Bld/Stl  8. Fatigue, unspecified type  - Vitamin B12 - Iron and TIBC - CBC with Differential/Platelet  9. Medication management  - Urinalysis, Routine w reflex microscopic - Microalbumin / creatinine urine ratio - CBC with Differential/Platelet - BASIC METABOLIC PANEL WITH GFR - Hepatic function panel - Magnesium - Lipid panel - TSH - Hemoglobin A1c - Insulin, random - VITAMIN D 25 Hydroxy  10. Screening examination for pulmonary tuberculosis  - PPD  11. Need for prophylactic vaccination with combined diphtheria-tetanus-pertussis (DTP) vaccine  - Tdap vaccine greater  than or equal to 7yo IM       Patient was counseled in prudent diet to achieve/maintain BMI less than 25 for weight control, BP monitoring, regular exercise and medications. Discussed med's effects and SE's. Screening labs and tests as requested with regular follow-up as recommended. Over 40 minutes of exam, counseling, chart review and high complex critical decision making was performed.

## 2017-05-12 NOTE — Patient Instructions (Signed)

## 2017-05-13 LAB — MICROALBUMIN / CREATININE URINE RATIO
Creatinine, Urine: 244 mg/dL (ref 20–320)
Microalb Creat Ratio: 3 mcg/mg creat (ref <30)
Microalb, Ur: 0.7 mg/dL

## 2017-05-13 LAB — INSULIN, RANDOM: INSULIN: 2.9 u[IU]/mL (ref 2.0–19.6)

## 2017-05-13 LAB — URINALYSIS, ROUTINE W REFLEX MICROSCOPIC
BILIRUBIN URINE: NEGATIVE
Glucose, UA: NEGATIVE
HGB URINE DIPSTICK: NEGATIVE
KETONES UR: NEGATIVE
Leukocytes, UA: NEGATIVE
Nitrite: NEGATIVE
PH: 6.5 (ref 5.0–8.0)
Protein, ur: NEGATIVE
Specific Gravity, Urine: 1.027 (ref 1.001–1.035)

## 2017-05-13 LAB — VITAMIN D 25 HYDROXY (VIT D DEFICIENCY, FRACTURES): Vit D, 25-Hydroxy: 62 ng/mL (ref 30–100)

## 2017-05-13 LAB — HEMOGLOBIN A1C
Hgb A1c MFr Bld: 5.1 % (ref <5.7)
MEAN PLASMA GLUCOSE: 100 mg/dL

## 2017-05-13 LAB — VITAMIN B12: Vitamin B-12: 413 pg/mL (ref 200–1100)

## 2017-05-15 DIAGNOSIS — S53432D Radial collateral ligament sprain of left elbow, subsequent encounter: Secondary | ICD-10-CM | POA: Diagnosis not present

## 2017-05-15 DIAGNOSIS — M25522 Pain in left elbow: Secondary | ICD-10-CM | POA: Diagnosis not present

## 2017-05-15 DIAGNOSIS — M7712 Lateral epicondylitis, left elbow: Secondary | ICD-10-CM | POA: Diagnosis not present

## 2017-05-19 DIAGNOSIS — S56512D Strain of other extensor muscle, fascia and tendon at forearm level, left arm, subsequent encounter: Secondary | ICD-10-CM | POA: Diagnosis not present

## 2017-05-19 DIAGNOSIS — M7712 Lateral epicondylitis, left elbow: Secondary | ICD-10-CM | POA: Diagnosis not present

## 2017-05-19 DIAGNOSIS — M25522 Pain in left elbow: Secondary | ICD-10-CM | POA: Diagnosis not present

## 2017-05-20 ENCOUNTER — Ambulatory Visit
Admission: RE | Admit: 2017-05-20 | Discharge: 2017-05-20 | Disposition: A | Discharge status: Home / Self Care | Payer: 59 | Source: Ambulatory Visit | Attending: Obstetrics and Gynecology | Admitting: Obstetrics and Gynecology

## 2017-05-20 DIAGNOSIS — N6489 Other specified disorders of breast: Secondary | ICD-10-CM | POA: Diagnosis not present

## 2017-05-20 DIAGNOSIS — R928 Other abnormal and inconclusive findings on diagnostic imaging of breast: Secondary | ICD-10-CM

## 2017-05-20 DIAGNOSIS — N6011 Diffuse cystic mastopathy of right breast: Secondary | ICD-10-CM | POA: Diagnosis not present

## 2017-05-20 MED ORDER — GADOBENATE DIMEGLUMINE 529 MG/ML IV SOLN
14.0000 mL | Freq: Once | INTRAVENOUS | Status: AC | PRN
  Administered 2017-05-20: 14 mL via INTRAVENOUS

## 2017-05-21 DIAGNOSIS — S069X9S Unspecified intracranial injury with loss of consciousness of unspecified duration, sequela: Secondary | ICD-10-CM | POA: Diagnosis not present

## 2017-05-21 DIAGNOSIS — Z79899 Other long term (current) drug therapy: Secondary | ICD-10-CM | POA: Diagnosis not present

## 2017-05-22 DIAGNOSIS — S53432D Radial collateral ligament sprain of left elbow, subsequent encounter: Secondary | ICD-10-CM | POA: Diagnosis not present

## 2017-05-22 DIAGNOSIS — M25522 Pain in left elbow: Secondary | ICD-10-CM | POA: Diagnosis not present

## 2017-05-22 DIAGNOSIS — M7712 Lateral epicondylitis, left elbow: Secondary | ICD-10-CM | POA: Diagnosis not present

## 2017-05-26 DIAGNOSIS — S53432D Radial collateral ligament sprain of left elbow, subsequent encounter: Secondary | ICD-10-CM | POA: Diagnosis not present

## 2017-05-26 DIAGNOSIS — M25522 Pain in left elbow: Secondary | ICD-10-CM | POA: Diagnosis not present

## 2017-05-26 DIAGNOSIS — M7712 Lateral epicondylitis, left elbow: Secondary | ICD-10-CM | POA: Diagnosis not present

## 2017-05-28 DIAGNOSIS — S56512D Strain of other extensor muscle, fascia and tendon at forearm level, left arm, subsequent encounter: Secondary | ICD-10-CM | POA: Diagnosis not present

## 2017-05-28 DIAGNOSIS — M7712 Lateral epicondylitis, left elbow: Secondary | ICD-10-CM | POA: Diagnosis not present

## 2017-05-28 DIAGNOSIS — M25522 Pain in left elbow: Secondary | ICD-10-CM | POA: Diagnosis not present

## 2017-06-02 DIAGNOSIS — M25522 Pain in left elbow: Secondary | ICD-10-CM | POA: Diagnosis not present

## 2017-06-02 DIAGNOSIS — M7712 Lateral epicondylitis, left elbow: Secondary | ICD-10-CM | POA: Diagnosis not present

## 2017-06-02 DIAGNOSIS — S56512D Strain of other extensor muscle, fascia and tendon at forearm level, left arm, subsequent encounter: Secondary | ICD-10-CM | POA: Diagnosis not present

## 2017-06-04 DIAGNOSIS — M7712 Lateral epicondylitis, left elbow: Secondary | ICD-10-CM | POA: Diagnosis not present

## 2017-06-04 DIAGNOSIS — M25522 Pain in left elbow: Secondary | ICD-10-CM | POA: Diagnosis not present

## 2017-06-04 DIAGNOSIS — S56512D Strain of other extensor muscle, fascia and tendon at forearm level, left arm, subsequent encounter: Secondary | ICD-10-CM | POA: Diagnosis not present

## 2017-06-06 ENCOUNTER — Other Ambulatory Visit: Payer: Self-pay | Admitting: Internal Medicine

## 2017-06-09 DIAGNOSIS — M7712 Lateral epicondylitis, left elbow: Secondary | ICD-10-CM | POA: Diagnosis not present

## 2017-06-09 DIAGNOSIS — S56512D Strain of other extensor muscle, fascia and tendon at forearm level, left arm, subsequent encounter: Secondary | ICD-10-CM | POA: Diagnosis not present

## 2017-06-09 DIAGNOSIS — M25522 Pain in left elbow: Secondary | ICD-10-CM | POA: Diagnosis not present

## 2017-06-11 ENCOUNTER — Other Ambulatory Visit: Payer: Self-pay | Admitting: Internal Medicine

## 2017-06-11 ENCOUNTER — Other Ambulatory Visit: Payer: Self-pay | Admitting: *Deleted

## 2017-06-11 DIAGNOSIS — J4520 Mild intermittent asthma, uncomplicated: Secondary | ICD-10-CM

## 2017-06-11 DIAGNOSIS — M25522 Pain in left elbow: Secondary | ICD-10-CM | POA: Diagnosis not present

## 2017-06-11 DIAGNOSIS — M7712 Lateral epicondylitis, left elbow: Secondary | ICD-10-CM | POA: Diagnosis not present

## 2017-06-11 DIAGNOSIS — S56512D Strain of other extensor muscle, fascia and tendon at forearm level, left arm, subsequent encounter: Secondary | ICD-10-CM | POA: Diagnosis not present

## 2017-06-11 DIAGNOSIS — J45909 Unspecified asthma, uncomplicated: Secondary | ICD-10-CM | POA: Insufficient documentation

## 2017-06-11 MED ORDER — BECLOMETHASONE DIPROP HFA 80 MCG/ACT IN AERB: INHALATION_SPRAY | RESPIRATORY_TRACT | 3 refills | Status: DC

## 2017-06-16 DIAGNOSIS — S53432D Radial collateral ligament sprain of left elbow, subsequent encounter: Secondary | ICD-10-CM | POA: Diagnosis not present

## 2017-06-16 DIAGNOSIS — M7712 Lateral epicondylitis, left elbow: Secondary | ICD-10-CM | POA: Diagnosis not present

## 2017-06-16 DIAGNOSIS — M25522 Pain in left elbow: Secondary | ICD-10-CM | POA: Diagnosis not present

## 2017-06-24 ENCOUNTER — Ambulatory Visit (AMBULATORY_SURGERY_CENTER): Payer: Self-pay

## 2017-06-24 VITALS — Ht 68.5 in | Wt 162.8 lb

## 2017-06-24 DIAGNOSIS — Z8371 Family history of colonic polyps: Secondary | ICD-10-CM

## 2017-06-24 MED ORDER — NA SULFATE-K SULFATE-MG SULF 17.5-3.13-1.6 GM/177ML PO SOLN
ORAL | 0 refills | Status: DC
Start: 2017-06-24 — End: 2017-07-08

## 2017-06-24 NOTE — Progress Notes (Signed)
Per pt, no allergies to soy or egg products.Pt not taking any weight loss meds or using  O2 at home.   Pt refused Emmi video. 

## 2017-06-25 ENCOUNTER — Encounter: Payer: Self-pay | Admitting: Internal Medicine

## 2017-07-01 DIAGNOSIS — M25522 Pain in left elbow: Secondary | ICD-10-CM | POA: Diagnosis not present

## 2017-07-02 DIAGNOSIS — M25522 Pain in left elbow: Secondary | ICD-10-CM | POA: Diagnosis not present

## 2017-07-02 DIAGNOSIS — M25622 Stiffness of left elbow, not elsewhere classified: Secondary | ICD-10-CM | POA: Diagnosis not present

## 2017-07-04 DIAGNOSIS — M25622 Stiffness of left elbow, not elsewhere classified: Secondary | ICD-10-CM | POA: Diagnosis not present

## 2017-07-04 DIAGNOSIS — M25522 Pain in left elbow: Secondary | ICD-10-CM | POA: Diagnosis not present

## 2017-07-07 DIAGNOSIS — M25522 Pain in left elbow: Secondary | ICD-10-CM | POA: Diagnosis not present

## 2017-07-07 DIAGNOSIS — M25622 Stiffness of left elbow, not elsewhere classified: Secondary | ICD-10-CM | POA: Diagnosis not present

## 2017-07-08 ENCOUNTER — Ambulatory Visit (AMBULATORY_SURGERY_CENTER): Payer: 59 | Admitting: Internal Medicine

## 2017-07-08 ENCOUNTER — Encounter: Payer: Self-pay | Admitting: Internal Medicine

## 2017-07-08 VITALS — BP 117/66 | HR 64 | Temp 97.7°F | Resp 12 | Ht 68.5 in | Wt 162.0 lb

## 2017-07-08 DIAGNOSIS — Z1212 Encounter for screening for malignant neoplasm of rectum: Secondary | ICD-10-CM | POA: Diagnosis not present

## 2017-07-08 DIAGNOSIS — K633 Ulcer of intestine: Secondary | ICD-10-CM | POA: Diagnosis not present

## 2017-07-08 DIAGNOSIS — Z1211 Encounter for screening for malignant neoplasm of colon: Secondary | ICD-10-CM

## 2017-07-08 DIAGNOSIS — K55041 Focal (segmental) acute infarction of large intestine: Secondary | ICD-10-CM | POA: Diagnosis not present

## 2017-07-08 DIAGNOSIS — Z8371 Family history of colonic polyps: Secondary | ICD-10-CM | POA: Diagnosis not present

## 2017-07-08 DIAGNOSIS — D124 Benign neoplasm of descending colon: Secondary | ICD-10-CM | POA: Insufficient documentation

## 2017-07-08 MED ORDER — SODIUM CHLORIDE 0.9 % IV SOLN: 500.0000 mL | INTRAVENOUS | Status: DC

## 2017-07-08 NOTE — Progress Notes (Signed)
Report to PACU, RN, vss, BBS= Clear.  

## 2017-07-08 NOTE — Progress Notes (Signed)
Called to room to assist during endoscopic procedure.  Patient ID and intended procedure confirmed with present staff. Received instructions for my participation in the procedure from the performing physician.  

## 2017-07-08 NOTE — Op Note (Signed)
[facility] Patient Name: Nicole Waller Procedure Date: 07/08/2017 11:04 AM MRN: [id number] Endoscopist: Docia Chuck. Henrene Pastor , MD Age: 51 Referring MD:  Date of Birth: Oct 23, 1966 Gender: Female Account #: [id number] Procedure:                Colonoscopy, with biopsies Indications:              Screening for colorectal malignant neoplasm Medicines:                Monitored Anesthesia Care Procedure:                Pre-Anesthesia Assessment:                           - Prior to the procedure, a History and Physical                            was performed, and patient medications and                            allergies were reviewed. The patient's tolerance of                            previous anesthesia was also reviewed. The risks                            and benefits of the procedure and the sedation                            options and risks were discussed with the patient.                            All questions were answered, and informed consent                            was obtained. Prior Anticoagulants: The patient has                            taken no previous anticoagulant or antiplatelet                            agents. ASA Grade Assessment: II - A patient with                            mild systemic disease. After reviewing the risks                            and benefits, the patient was deemed in                            satisfactory condition to undergo the procedure.                           After obtaining informed consent, the colonoscope  was passed under direct vision. Throughout the                            procedure, the patient's blood pressure, pulse, and                            oxygen saturations were monitored continuously. The                            Colonoscope was introduced through the anus and                            advanced to the the cecum, identified by                            appendiceal  orifice and ileocecal valve. The                            terminal ileum, ileocecal valve, appendiceal                            orifice, and rectum were photographed. The quality                            of the bowel preparation was excellent. The                            colonoscopy was performed without difficulty. The                            patient tolerated the procedure well. The bowel                            preparation used was SUPREP. Scope In: 11:19:08 AM Scope Out: 11:38:59 AM Scope Withdrawal Time: 0 hours 15 minutes 54 seconds  Total Procedure Duration: 0 hours 19 minutes 51 seconds  Findings:                 The terminal ileum appeared normal x 10 cm.                           A few small-mouthed diverticula were found in the                            sigmoid colon.                           A few erosions were found in the descending colon.                            Biopsies were taken with a cold forceps for                            histology.  Internal hemorrhoids were found during                            retroflexion. The hemorrhoids were small.                           The exam was otherwise without abnormality on                            direct and retroflexion views. Complications:            No immediate complications. Estimated blood loss:                            None. Estimated Blood Loss:     Estimated blood loss: none. Impression:               - The examined portion of the ileum was normal.                           - Diverticulosis in the sigmoid colon.                           - A few erosions in the descending colon. Biopsied.                           - Internal hemorrhoids.                           - The examination was otherwise normal on direct                            and retroflexion views. Recommendation:           - Repeat colonoscopy in 10 years for screening                             purposes.                           - Patient has a contact number available for                            emergencies. The signs and symptoms of potential                            delayed complications were discussed with the                            patient. Return to normal activities tomorrow.                            Written discharge instructions were provided to the                            patient.                           -  Resume previous diet.                           - Continue present medications.                           - Await pathology results. Docia Chuck. Henrene Pastor, MD 07/08/2017 11:46:28 AM This report has been signed electronically.

## 2017-07-08 NOTE — Progress Notes (Signed)
Pt's states no medical or surgical changes since previsit or office visit. 

## 2017-07-08 NOTE — Patient Instructions (Signed)

## 2017-07-09 ENCOUNTER — Telehealth: Payer: Self-pay

## 2017-07-09 NOTE — Telephone Encounter (Signed)
  Follow up Call-  Call back number 07/08/2017  Post procedure Call Back phone  # [phone]  Permission to leave phone message Yes  Some recent data might be hidden     Patient questions:  Do you have a fever, pain , or abdominal swelling? No. Pain Score  0 *  Have you tolerated food without any problems? Yes.    Have you been able to return to your normal activities? Yes.    Do you have any questions about your discharge instructions: Diet   No. Medications  No. Follow up visit  No.  Do you have questions or concerns about your Care? No.  Actions: * If pain score is 4 or above: No action needed, pain <4.

## 2017-07-10 ENCOUNTER — Encounter: Payer: Self-pay | Admitting: Internal Medicine

## 2017-07-10 DIAGNOSIS — M25522 Pain in left elbow: Secondary | ICD-10-CM | POA: Diagnosis not present

## 2017-07-10 DIAGNOSIS — M25622 Stiffness of left elbow, not elsewhere classified: Secondary | ICD-10-CM | POA: Diagnosis not present

## 2017-07-15 DIAGNOSIS — M25522 Pain in left elbow: Secondary | ICD-10-CM | POA: Diagnosis not present

## 2017-07-15 DIAGNOSIS — M25622 Stiffness of left elbow, not elsewhere classified: Secondary | ICD-10-CM | POA: Diagnosis not present

## 2017-07-17 DIAGNOSIS — M25522 Pain in left elbow: Secondary | ICD-10-CM | POA: Diagnosis not present

## 2017-07-17 DIAGNOSIS — M25622 Stiffness of left elbow, not elsewhere classified: Secondary | ICD-10-CM | POA: Diagnosis not present

## 2017-07-21 DIAGNOSIS — M25622 Stiffness of left elbow, not elsewhere classified: Secondary | ICD-10-CM | POA: Diagnosis not present

## 2017-07-21 DIAGNOSIS — M25522 Pain in left elbow: Secondary | ICD-10-CM | POA: Diagnosis not present

## 2017-07-23 DIAGNOSIS — M25522 Pain in left elbow: Secondary | ICD-10-CM | POA: Diagnosis not present

## 2017-07-23 DIAGNOSIS — M25622 Stiffness of left elbow, not elsewhere classified: Secondary | ICD-10-CM | POA: Diagnosis not present

## 2017-07-25 DIAGNOSIS — M25622 Stiffness of left elbow, not elsewhere classified: Secondary | ICD-10-CM | POA: Diagnosis not present

## 2017-07-25 DIAGNOSIS — M25522 Pain in left elbow: Secondary | ICD-10-CM | POA: Diagnosis not present

## 2017-07-28 DIAGNOSIS — M25622 Stiffness of left elbow, not elsewhere classified: Secondary | ICD-10-CM | POA: Diagnosis not present

## 2017-07-28 DIAGNOSIS — M25522 Pain in left elbow: Secondary | ICD-10-CM | POA: Diagnosis not present

## 2017-07-31 DIAGNOSIS — M25522 Pain in left elbow: Secondary | ICD-10-CM | POA: Diagnosis not present

## 2017-07-31 DIAGNOSIS — M25622 Stiffness of left elbow, not elsewhere classified: Secondary | ICD-10-CM | POA: Diagnosis not present

## 2017-08-05 DIAGNOSIS — M25522 Pain in left elbow: Secondary | ICD-10-CM | POA: Diagnosis not present

## 2017-08-06 DIAGNOSIS — M25522 Pain in left elbow: Secondary | ICD-10-CM | POA: Diagnosis not present

## 2017-08-12 DIAGNOSIS — M25522 Pain in left elbow: Secondary | ICD-10-CM | POA: Diagnosis not present

## 2017-08-15 DIAGNOSIS — S069X9S Unspecified intracranial injury with loss of consciousness of unspecified duration, sequela: Secondary | ICD-10-CM | POA: Diagnosis not present

## 2017-08-15 DIAGNOSIS — Z79899 Other long term (current) drug therapy: Secondary | ICD-10-CM | POA: Diagnosis not present

## 2017-08-19 DIAGNOSIS — Z23 Encounter for immunization: Secondary | ICD-10-CM | POA: Diagnosis not present

## 2017-08-27 ENCOUNTER — Other Ambulatory Visit: Payer: Self-pay | Admitting: Obstetrics and Gynecology

## 2017-08-28 ENCOUNTER — Other Ambulatory Visit: Payer: Self-pay | Admitting: Obstetrics and Gynecology

## 2017-08-28 DIAGNOSIS — R928 Other abnormal and inconclusive findings on diagnostic imaging of breast: Secondary | ICD-10-CM

## 2017-09-15 DIAGNOSIS — H43812 Vitreous degeneration, left eye: Secondary | ICD-10-CM | POA: Diagnosis not present

## 2017-09-17 DIAGNOSIS — H3562 Retinal hemorrhage, left eye: Secondary | ICD-10-CM | POA: Diagnosis not present

## 2017-09-17 DIAGNOSIS — H43393 Other vitreous opacities, bilateral: Secondary | ICD-10-CM | POA: Diagnosis not present

## 2017-09-17 DIAGNOSIS — H43812 Vitreous degeneration, left eye: Secondary | ICD-10-CM | POA: Diagnosis not present

## 2017-09-17 LAB — HM DIABETES EYE EXAM

## 2017-10-09 ENCOUNTER — Ambulatory Visit (INDEPENDENT_AMBULATORY_CARE_PROVIDER_SITE_OTHER): Payer: 59 | Admitting: Adult Health

## 2017-10-09 ENCOUNTER — Encounter: Payer: Self-pay | Admitting: Adult Health

## 2017-10-09 VITALS — BP 126/80 | HR 98 | Temp 85.0°F | Ht 68.5 in | Wt 164.0 lb

## 2017-10-09 DIAGNOSIS — J069 Acute upper respiratory infection, unspecified: Secondary | ICD-10-CM | POA: Diagnosis not present

## 2017-10-09 DIAGNOSIS — Z23 Encounter for immunization: Secondary | ICD-10-CM

## 2017-10-09 MED ORDER — PREDNISONE 20 MG PO TABS: ORAL_TABLET | ORAL | 0 refills | Status: DC

## 2017-10-09 MED ORDER — AZITHROMYCIN 250 MG PO TABS: ORAL_TABLET | ORAL | 1 refills | Status: AC

## 2017-10-09 NOTE — Patient Instructions (Signed)

## 2017-10-09 NOTE — Progress Notes (Signed)
Assessment and Plan:  Nicole Waller was seen today for cough.  Diagnoses and all orders for this visit:  Upper respiratory tract infection, unspecified type Unremarkable exam- Discussed the importance of avoiding unnecessary antibiotic therapy. Suggested symptomatic OTC remedies. Nasal saline spray for congestion, mucinex for chest congestion Nasal steroids, allergy pill, oral steroids  Follow up as needed. Providing abx should symptoms worsen due to hx of recurrent severe resp infections -     predniSONE (DELTASONE) 20 MG tablet; 2 tablets daily for 3 days, 1 tablet daily for 4 days. -     azithromycin (ZITHROMAX) 250 MG tablet; Take 2 tablets (500 mg) on  Day 1,  followed by 1 tablet (250 mg) once daily on Days 2 through 5.  Need for pneumonia vaccination Due to high risk, will administer prevnar 13 today and have follow up in 8+ weeks for pneumovax23  Further disposition pending results of labs. Discussed med's effects and SE's.   Over 15 minutes of exam, counseling, chart review, and critical decision making was performed.   Future Appointments  Date Time Provider [facility]  11/07/2017 10:00 AM [facility]-315 MR 1 [facility]315[facility]  05/26/2018  9:00 AM Unk Pinto, MD [facility] None    ------------------------------------------------------------------------------------------------------------------   HPI BP 126/80   Pulse 98   Temp (!) 85 F (29.4 C)   Ht 5' 8.5" (1.74 m)   Wt 164 lb (74.4 kg)   SpO2 95%   BMI 24.57 kg/m   51 y.o.female presents for sore throat, dry cough progressing to wet cough productive of green phlegm over the past 4 days. Denies fever/chills, SOB/wheezing, chest pain, endorses sense of tightness in chest. Denies rash, HA/dizziness, N/V/D/abdominal pain, malaise. She takes a daily allergy medication, and has taken some tylenol/combination cold medcation for current episode.   + hx exercise induced asthma- denies recent use of rescue inhaler, +  hx multiple episodes of pneumonia and bronchitis  Has had single pneumonia vaccination in 2008, has had influenza vaccination for this year.    Past Medical History:  Diagnosis Date  . Abdominal pain    in past  . Abnormal chest CT    in past  . Abnormal EKG   . Anxiety    since head injury  . Chronic cough   . Chronic headache   . Concussion    past injury from horse  . Dyspnea   . Exercise-induced asthma   . Head injury 04/2013   due to horse injury/ hit backwards by horse head!  . History of nephrolithiasis   . Hx of migraines   . Post-operative nausea and vomiting   . Pulmonary nodule 2011  . Rosacea      Allergies  Allergen Reactions  . Accolate [Zafirlukast]     GI Upset  . Codeine     REACTION: nausea and vomiting  . Lyrica [Pregabalin]     Fatigue  . Oxycodone     nauseated  . Probiotic [Acidophilus]     Headache  . Tape     Causes redness, and skin gets raw!    Current Outpatient Medications on File Prior to Visit  Medication Sig  . albuterol (PROAIR HFA) 108 (90 Base) MCG/ACT inhaler 2 puffs every 4 hours as needed only  if your can't catch your breath  . Amphetamine ER (ADZENYS XR-ODT) 9.4 MG TBED Take 1 tablet by mouth daily.  Marland Kitchen aspirin 81 MG tablet Take 81 mg by mouth. Take one pill every 4 days  .  B Complex Vitamins (B COMPLEX-B12) TABS Take 1 tablet by mouth daily.  . beclomethasone (QVAR REDIHALER) 80 MCG/ACT inhaler Use 2 inhalations 1 to 2 x/ day for Asthma prophylaxis.  . Calcium 600-200 MG-UNIT per tablet Take 1 tablet by mouth every other day. Every three days  . cetirizine (ZYRTEC) 10 MG tablet Take 10 mg by mouth daily.  . cholecalciferol (VITAMIN D) 1000 UNITS tablet 2,000 Units. 1 tablet daily  . Coenzyme Q10 (COQ10) 100 MG CAPS Take by mouth daily.  . IMITREX 100 MG tablet TAKE ONE TABLET NOW FOR MIGRAINE. MAY REPEAT DOSE ONCE IN 2 HOURS. MAX 2 TABS/24 HOURS (Patient taking differently: TAKE ONE TABLET PRN FOR MIGRAINE. MAY REPEAT DOSE  ONCE IN 2 HOURS. MAX 2 TABS/24 HOURS)  . IMITREX 20 MG/ACT nasal spray SPRAY 1 SPRAY IMMEDIATELY FOR MIGRAINE AND MAY REPEAT 1 TIME IN 2 HOURS (MAX 2 DOSES/24 HOURS)  . MAGNESIUM GLYCINATE PLUS PO Take 400 mg by mouth 2 (two) times daily.  . montelukast (SINGULAIR) 10 MG tablet Take 1 tablet (10 mg total) by mouth daily.  . montelukast (SINGULAIR) 10 MG tablet TAKE 1 TABLET BY MOUTH EVERY DAY  . Multiple Vitamin (MULTIVITAMIN) capsule Take 1 capsule by mouth daily.    . NON FORMULARY Metro lotion prn for rosecea  . norgestrel-ethinyl estradiol (CRYSELLE-28) 0.3-30 MG-MCG tablet Take 1 tablet by mouth daily.  . valACYclovir (VALTREX) 500 MG tablet Take 500 mg by mouth daily as needed (for outbreak).    Current Facility-Administered Medications on File Prior to Visit  Medication  . 0.9 %  sodium chloride infusion    ROS: all negative except above.   Physical Exam:  BP 126/80   Pulse 98   Temp (!) 85 F (29.4 C)   Ht 5' 8.5" (1.74 m)   Wt 164 lb (74.4 kg)   SpO2 95%   BMI 24.57 kg/m   General Appearance: Well nourished, in no apparent distress. Eyes: PERRLA, EOMs, conjunctiva no swelling or erythema Sinuses: No Frontal/maxillary tenderness ENT/Mouth: Ext aud canals clear, TMs without erythema, bulging. No erythema, swelling, or exudate on post pharynx.  Tonsils not swollen or erythematous. Hearing normal.  Neck: Supple, thyroid normal.  Respiratory: Respiratory effort normal, BS equal bilaterally without rales, rhonchi, wheezing or stridor.  Cardio: RRR with no MRGs. Brisk peripheral pulses without edema.  Abdomen: Soft, + BS.  Non tender, no guarding, rebound, hernias, masses. Lymphatics: Non tender without lymphadenopathy.  Musculoskeletal: Full ROM, 5/5 strength, normal gait.  Skin: Warm, dry without rashes, lesions, ecchymosis.  Neuro: Cranial nerves intact. Normal muscle tone, no cerebellar symptoms. Sensation intact.  Psych: Awake and oriented X 3, normal affect, Insight  and Judgment appropriate.     Izora Ribas, NP 8:48 AM [facility]

## 2017-10-09 NOTE — Addendum Note (Signed)
Addended by: Chancy Hurter on: 10/09/2017 09:25 AM   Modules accepted: Orders

## 2017-10-15 ENCOUNTER — Other Ambulatory Visit: Payer: Self-pay | Admitting: Internal Medicine

## 2017-10-15 ENCOUNTER — Encounter: Payer: Self-pay | Admitting: *Deleted

## 2017-10-16 DIAGNOSIS — H5213 Myopia, bilateral: Secondary | ICD-10-CM | POA: Diagnosis not present

## 2017-10-29 ENCOUNTER — Other Ambulatory Visit: Payer: Self-pay | Admitting: Internal Medicine

## 2017-11-07 ENCOUNTER — Other Ambulatory Visit: Payer: 59

## 2017-11-07 ENCOUNTER — Ambulatory Visit
Admission: RE | Admit: 2017-11-07 | Discharge: 2017-11-07 | Disposition: A | Discharge status: Home / Self Care | Payer: 59 | Source: Ambulatory Visit | Attending: Obstetrics and Gynecology | Admitting: Obstetrics and Gynecology

## 2017-11-07 DIAGNOSIS — R928 Other abnormal and inconclusive findings on diagnostic imaging of breast: Secondary | ICD-10-CM

## 2017-11-07 MED ORDER — GADOBENATE DIMEGLUMINE 529 MG/ML IV SOLN
15.0000 mL | Freq: Once | INTRAVENOUS | Status: AC | PRN
  Administered 2017-11-07: 15 mL via INTRAVENOUS

## 2017-11-17 DIAGNOSIS — Z79899 Other long term (current) drug therapy: Secondary | ICD-10-CM | POA: Diagnosis not present

## 2017-11-17 DIAGNOSIS — S069X9S Unspecified intracranial injury with loss of consciousness of unspecified duration, sequela: Secondary | ICD-10-CM | POA: Diagnosis not present

## 2017-11-26 DIAGNOSIS — H43393 Other vitreous opacities, bilateral: Secondary | ICD-10-CM | POA: Diagnosis not present

## 2017-11-26 DIAGNOSIS — H21342 Primary cyst of pars plana, left eye: Secondary | ICD-10-CM | POA: Diagnosis not present

## 2017-11-26 DIAGNOSIS — H43813 Vitreous degeneration, bilateral: Secondary | ICD-10-CM | POA: Diagnosis not present

## 2017-12-04 ENCOUNTER — Ambulatory Visit (INDEPENDENT_AMBULATORY_CARE_PROVIDER_SITE_OTHER): Payer: 59

## 2017-12-04 DIAGNOSIS — Z23 Encounter for immunization: Secondary | ICD-10-CM | POA: Diagnosis not present

## 2017-12-05 ENCOUNTER — Other Ambulatory Visit: Payer: Self-pay | Admitting: Internal Medicine

## 2017-12-05 MED ORDER — PREDNISONE 20 MG PO TABS: ORAL_TABLET | ORAL | 0 refills | Status: DC

## 2018-02-17 DIAGNOSIS — S069X9S Unspecified intracranial injury with loss of consciousness of unspecified duration, sequela: Secondary | ICD-10-CM | POA: Diagnosis not present

## 2018-02-17 DIAGNOSIS — Z79899 Other long term (current) drug therapy: Secondary | ICD-10-CM | POA: Diagnosis not present

## 2018-04-17 DIAGNOSIS — H43813 Vitreous degeneration, bilateral: Secondary | ICD-10-CM | POA: Diagnosis not present

## 2018-04-17 DIAGNOSIS — H43393 Other vitreous opacities, bilateral: Secondary | ICD-10-CM | POA: Diagnosis not present

## 2018-04-17 DIAGNOSIS — H3561 Retinal hemorrhage, right eye: Secondary | ICD-10-CM | POA: Diagnosis not present

## 2018-04-21 ENCOUNTER — Other Ambulatory Visit: Payer: Self-pay | Admitting: Internal Medicine

## 2018-04-23 DIAGNOSIS — Z8049 Family history of malignant neoplasm of other genital organs: Secondary | ICD-10-CM | POA: Diagnosis not present

## 2018-04-23 DIAGNOSIS — Z803 Family history of malignant neoplasm of breast: Secondary | ICD-10-CM | POA: Diagnosis not present

## 2018-04-23 DIAGNOSIS — Z6824 Body mass index (BMI) 24.0-24.9, adult: Secondary | ICD-10-CM | POA: Diagnosis not present

## 2018-04-23 DIAGNOSIS — Z01419 Encounter for gynecological examination (general) (routine) without abnormal findings: Secondary | ICD-10-CM | POA: Diagnosis not present

## 2018-04-24 ENCOUNTER — Other Ambulatory Visit: Payer: Self-pay | Admitting: *Deleted

## 2018-04-24 MED ORDER — SUMATRIPTAN SUCCINATE 100 MG PO TABS: ORAL_TABLET | ORAL | 1 refills | Status: DC

## 2018-04-27 DIAGNOSIS — H43391 Other vitreous opacities, right eye: Secondary | ICD-10-CM | POA: Diagnosis not present

## 2018-04-27 DIAGNOSIS — H3561 Retinal hemorrhage, right eye: Secondary | ICD-10-CM | POA: Diagnosis not present

## 2018-04-27 DIAGNOSIS — H43811 Vitreous degeneration, right eye: Secondary | ICD-10-CM | POA: Diagnosis not present

## 2018-05-21 DIAGNOSIS — Z79899 Other long term (current) drug therapy: Secondary | ICD-10-CM | POA: Diagnosis not present

## 2018-05-26 ENCOUNTER — Encounter: Payer: Self-pay | Admitting: Internal Medicine

## 2018-05-26 ENCOUNTER — Ambulatory Visit: Payer: 59 | Admitting: Internal Medicine

## 2018-05-26 VITALS — BP 116/80 | HR 72 | Temp 97.0°F | Resp 16 | Ht 68.25 in | Wt 162.2 lb

## 2018-05-26 DIAGNOSIS — Z1211 Encounter for screening for malignant neoplasm of colon: Secondary | ICD-10-CM

## 2018-05-26 DIAGNOSIS — Z0000 Encounter for general adult medical examination without abnormal findings: Secondary | ICD-10-CM

## 2018-05-26 DIAGNOSIS — Z1212 Encounter for screening for malignant neoplasm of rectum: Secondary | ICD-10-CM

## 2018-05-26 DIAGNOSIS — Z136 Encounter for screening for cardiovascular disorders: Secondary | ICD-10-CM

## 2018-05-26 DIAGNOSIS — R030 Elevated blood-pressure reading, without diagnosis of hypertension: Secondary | ICD-10-CM

## 2018-05-26 DIAGNOSIS — E7800 Pure hypercholesterolemia, unspecified: Secondary | ICD-10-CM | POA: Diagnosis not present

## 2018-05-26 DIAGNOSIS — R5383 Other fatigue: Secondary | ICD-10-CM

## 2018-05-26 DIAGNOSIS — Z0001 Encounter for general adult medical examination with abnormal findings: Secondary | ICD-10-CM

## 2018-05-26 DIAGNOSIS — R7309 Other abnormal glucose: Secondary | ICD-10-CM | POA: Diagnosis not present

## 2018-05-26 DIAGNOSIS — E559 Vitamin D deficiency, unspecified: Secondary | ICD-10-CM

## 2018-05-26 DIAGNOSIS — Z111 Encounter for screening for respiratory tuberculosis: Secondary | ICD-10-CM

## 2018-05-26 DIAGNOSIS — Z79899 Other long term (current) drug therapy: Secondary | ICD-10-CM

## 2018-05-26 DIAGNOSIS — R9431 Abnormal electrocardiogram [ECG] [EKG]: Secondary | ICD-10-CM

## 2018-05-26 DIAGNOSIS — Z8249 Family history of ischemic heart disease and other diseases of the circulatory system: Secondary | ICD-10-CM

## 2018-05-26 NOTE — Patient Instructions (Signed)

## 2018-05-26 NOTE — Progress Notes (Signed)
[facility] Unk Pinto, M.D.     Uvaldo Bristle. Silverio Lay, P.A.-C Liane Comber, [place] [address] [place] [address] Telephone [phone] Telefax [phone] Annual Screening/Preventative Visit & Comprehensive Evaluation &  Examination     This very nice 52 y.o. MWF  presents for a Screening /Preventative Visit & comprehensive evaluation and management of multiple medical co-morbidities.  Patient has been followed expectantly for elevated BP, HLD, Prediabetes  and Vitamin D Deficiency. Patient has hx/o abnormal MGM thru Dr Sherran Needs office & is being followed with Breast MRI's.      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 Patient had negative Head CT scan & Brain MRI.  She reports difficulty with ST recall, dysnomia for  names and words, dyscalculia, R-L &spatial disorientation and difficuties with attention and staying focused on tasks.  Neuropsych testing in June 2017 was normal. Patient is currently treated by Dr Johnnye Sima for ADD.  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       Patient is monitored expectantly for elevated BP. It has been controlled and patient denies any cardiac symptoms as chest pain, palpitations, shortness of breath, dizziness or ankle swelling. Today's BP is at goal - 116/80     Patient's lipids are controlled with diet and last lipids were at goal: Lab Results  Component Value Date   CHOL 172 05/12/2017   HDL 72 05/12/2017   LDLCALC 83 05/12/2017   TRIG 83 05/12/2017   CHOLHDL 2.4 05/12/2017      Patient is monitored expectantly for prediabetes and patient denies reactive hypoglycemic symptoms, visual blurring, diabetic polys, or paresthesias. Last A1c was normal & at goal: Lab Results  Component Value Date   HGBA1C 5.1 05/12/2017      Finally, patient  has history of Vitamin D Deficiency and last Vitamin D was at goal; Lab Results  Component Value Date   VD25OH 62 05/12/2017   Current Outpatient Medications on File Prior to Visit  Medication Sig  . albuterol (PROAIR HFA) 108 (90 Base) MCG/ACT inhaler 2 puffs every 4 hours as needed only  if your can't catch your breath  . Amphetamine ER (ADZENYS XR-ODT) 9.4 MG TBED Take 1 tablet by mouth daily.  Marland Kitchen aspirin 81 MG tablet Take 81 mg by mouth. Take one pill every 4 days  . B Complex Vitamins (B COMPLEX-B12) TABS Take 1 tablet by mouth daily.  . beclomethasone (QVAR REDIHALER) 80 MCG/ACT inhaler Use 2 inhalations 1 to 2 x/ day for Asthma prophylaxis.  . Calcium 600-200 MG-UNIT per tablet Take 1 tablet by mouth every other day. Every three days  . cetirizine (ZYRTEC) 10 MG tablet Take 10 mg by mouth daily.  . cholecalciferol (VITAMIN D) 1000 UNITS tablet 2,000 Units. 1 tablet daily  . Coenzyme Q10 (COQ10) 100 MG CAPS Take by mouth daily.  Dellis Filbert 20 MG/ACT nasal spray SPRAY 1 SPRAY IMMEDIATELY FOR MIGRAINE AND MAY REPEAT 1 TIME IN 2 HOURS (MAX 2 DOSES/24 HOURS)  . MAGNESIUM GLYCINATE PLUS PO Take 400 mg by mouth 2 (two) times daily.  . montelukast (SINGULAIR) 10 MG tablet TAKE 1 TABLET BY MOUTH EVERY DAY  . Multiple Vitamin (MULTIVITAMIN) capsule Take 1 capsule by mouth daily.    . NON FORMULARY Metro lotion prn for rosecea  . norgestrel-ethinyl estradiol (CRYSELLE-28)  0.3-30 MG-MCG tablet Take 1 tablet by mouth daily.  . Omega-3 Fatty Acids (FISH OIL) 1000 MG CAPS Take 1,000 mg by mouth daily.  . SUMAtriptan (IMITREX) 100 MG tablet TAKE ONE TABLET BY MOUTH AT ONSET OF MIGRAINE. MAY REPEAT DOSE ONCE IN 2 HOURS. MAX 2 TABS/24 HOURS  . valACYclovir (VALTREX) 500 MG tablet Take 500 mg by mouth daily as needed (for outbreak).    No current facility-administered medications on file prior to visit.    Allergies  Allergen Reactions  . Accolate [Zafirlukast]     GI Upset  . Codeine      REACTION: nausea and vomiting  . Lyrica [Pregabalin]     Fatigue  . Oxycodone     nauseated  . Probiotic [Acidophilus]     Headache  . Tape     Causes redness, and skin gets raw!   Past Medical History:  Diagnosis Date  . Abdominal pain    in past  . Abnormal chest CT    in past  . Abnormal EKG   . Anxiety    since head injury  . Chronic cough   . Chronic headache   . Concussion    past injury from horse  . Dyspnea   . Exercise-induced asthma   . Head injury 04/2013   due to horse injury/ hit backwards by horse head!  . History of nephrolithiasis   . Hx of migraines   . Post-operative nausea and vomiting   . Pulmonary nodule 2011  . Rosacea    Health Maintenance  Topic Date Due  . HIV Screening  10/21/1981  . PAP SMEAR  10/22/1987  . MAMMOGRAM  10/21/2016  . INFLUENZA VACCINE  06/11/2018  . TETANUS/TDAP  05/13/2027  . COLONOSCOPY  07/09/2027   Immunization History  Administered Date(s) Administered  . Influenza Split 08/11/2014, 08/19/2017  . Influenza Whole 08/11/2012  . PPD Test 04/16/2016, 05/12/2017, 05/26/2018  . Pneumococcal Conjugate-13 10/09/2017  . Pneumococcal Polysaccharide-23 12/04/2017  . Pneumococcal-Unspecified 11/11/2006  . Td 11/11/2006  . Tdap 05/12/2017   Last Colon - 07/08/2017 - Dr Henrene Pastor - Negative - Recc 10 yr f/u in Aug 2028  Last MGM - 11/07/2017 - Breast MRI - recc 6 month f/u Breast MRI  Past Surgical History:  Procedure Laterality Date  . ELBOW SURGERY Right    2012/ tendon and reattachment left arm in 2018  . FOOT SURGERY Left 1991   bunionectomy  . SHOULDER SURGERY Bilateral    right-2003/left 2005 due to bone spurs   Family History  Problem Relation Age of Onset  . Colon polyps Father   . Colitis Father   . Heart disease Father   . Hypertension Father   . Hyperlipidemia Father   . Breast cancer Maternal Grandmother   . Cancer Maternal Grandmother 72       Breast,uterine,ovarian  . Colon polyps Mother   .  Diabetes Mother   . Rheum arthritis Mother   . Lupus Mother   . Diabetes Paternal Grandfather   . Hypertension Sister   . Hyperlipidemia Sister    Social History   Tobacco Use  . Smoking status: Never Smoker  . Smokeless tobacco: Never Used  Substance Use Topics  . Alcohol use: No  . Drug use: No    ROS Constitutional: Denies fever, chills, weight loss/gain, headaches, insomnia,  night sweats, and change in appetite. Does c/o fatigue. Eyes: Denies redness, blurred vision, diplopia, discharge, itchy, watery eyes.  ENT: Denies discharge, congestion,  post nasal drip, epistaxis, sore throat, earache, hearing loss, dental pain, Tinnitus, Vertigo, Sinus pain, snoring.  Cardio: Denies chest pain, palpitations, irregular heartbeat, syncope, dyspnea, diaphoresis, orthopnea, PND, claudication, edema Respiratory: denies cough, dyspnea, DOE, pleurisy, hoarseness, laryngitis, wheezing.  Gastrointestinal: Denies dysphagia, heartburn, reflux, water brash, pain, cramps, nausea, vomiting, bloating, diarrhea, constipation, hematemesis, melena, hematochezia, jaundice, hemorrhoids Genitourinary: Denies dysuria, frequency, urgency, nocturia, hesitancy, discharge, hematuria, flank pain Breast: Breast lumps, nipple discharge, bleeding.  Musculoskeletal: Denies arthralgia, myalgia, stiffness, Jt. Swelling, pain, limp, and strain/sprain. Denies falls. Skin: Denies puritis, rash, hives, warts, acne, eczema, changing in skin lesion Neuro: No weakness, tremor, incoordination, spasms, paresthesia, pain Psychiatric: Denies confusion, memory loss, sensory loss. Denies Depression. Endocrine: Denies change in weight, skin, hair change, nocturia, and paresthesia, diabetic polys, visual blurring, hyper / hypo glycemic episodes.  Heme/Lymph: No excessive bleeding, bruising, enlarged lymph nodes.  Physical Exam  BP 116/80   Pulse 72   Temp (!) 97 F (36.1 C)   Resp 16   Ht 5' 8.25" (1.734 m)   Wt 162 lb 3.2 oz  (73.6 kg)   BMI 24.48 kg/m   General Appearance: Well nourished, well groomed and in no apparent distress.  Eyes: PERRLA, EOMs, conjunctiva no swelling or erythema, normal fundi and vessels. Sinuses: No frontal/maxillary tenderness ENT/Mouth: EACs patent / TMs  nl. Nares clear without erythema, swelling, mucoid exudates. Oral hygiene is good. No erythema, swelling, or exudate. Tongue normal, non-obstructing. Tonsils not swollen or erythematous. Hearing normal.  Neck: Supple, thyroid not palpable. No bruits, nodes or JVD. Respiratory: Respiratory effort normal.  BS equal and clear bilateral without rales, rhonci, wheezing or stridor. Cardio: Heart sounds are normal with regular rate and rhythm and no murmurs, rubs or gallops. Peripheral pulses are normal and equal bilaterally without edema. No aortic or femoral bruits. Chest: symmetric with normal excursions and percussion. Breasts:  Deferred to Dr Radene Knee Abdomen: Flat, soft with bowel sounds active. Nontender, no guarding, rebound, hernias, masses, or organomegaly.  Lymphatics: Non tender without lymphadenopathy.  Musculoskeletal: Full ROM all peripheral extremities, joint stability, 5/5 strength, and normal gait. Skin: Warm and dry without rashes, lesions, cyanosis, clubbing or  ecchymosis.  Neuro: Cranial nerves intact, reflexes equal bilaterally. Normal muscle tone, no cerebellar symptoms. Sensation intact.  Pysch: Alert and oriented X 3, normal affect, Insight and Judgment appropriate.   Assessment and Plan  1. Annual Preventative Screening Examination  2. Elevated BP without diagnosis of hypertension  - EKG 12-Lead - Urinalysis, Routine w reflex microscopic - Microalbumin / creatinine urine ratio - CBC with Differential/Platelet - COMPLETE METABOLIC PANEL WITH GFR - Magnesium - TSH - [place], RETROPERITNL ABD,  LTD  3. Elevated cholesterol, screening  - EKG 12-Lead - Lipid panel - [place], RETROPERITNL ABD,  LTD  4. Other  abnormal glucose, screening  - EKG 12-Lead - Insulin, random - Hemoglobin A1c - [place], RETROPERITNL ABD,  LTD  5. Vitamin D deficiency  - VITAMIN D 25 Hydroxyl  6. Screening for ischemic heart disease  - EKG 12-Lead - Lipid panel  7. FHx: heart disease  - EKG 12-Lead - [place], RETROPERITNL ABD,  LTD  8. Abnormal EKG  - EKG 12-Lead  9. Screening for AAA (aortic abdominal aneurysm)  - [place], RETROPERITNL ABD,  LTD  10. Screening for colorectal cancer  - Negative Colonoscopy in Aug 2018  11. Screening examination for pulmonary tuberculosis  - PPD  12. Fatigue, unspecified type  - Iron,Total/Total Iron Binding Cap - Vitamin B12 - CBC  with Differential/Platelet  13. Medication management  - Urinalysis, Routine w reflex microscopic - Microalbumin / creatinine urine ratio - CBC with Differential/Platelet - COMPLETE METABOLIC PANEL WITH GFR - Magnesium - Lipid panel - TSH - Insulin, random - VITAMIN D 25 Hydroxyl - Hemoglobin A1c     Patient was counseled in prudent diet to achieve/maintain BMI less than 25 for weight control, BP monitoring, regular exercise and medications. Discussed med's effects and SE's. Screening labs and tests as requested with regular follow-up as recommended. Over 40 minutes of exam, counseling, chart review and high complex critical decision making was performed.

## 2018-05-27 LAB — COMPLETE METABOLIC PANEL WITH GFR
AG Ratio: 1.5 (calc) (ref 1.0–2.5)
ALBUMIN MSPROF: 4.2 g/dL (ref 3.6–5.1)
ALKALINE PHOSPHATASE (APISO): 75 U/L (ref 33–130)
ALT: 17 U/L (ref 6–29)
AST: 19 U/L (ref 10–35)
BUN: 14 mg/dL (ref 7–25)
CO2: 29 mmol/L (ref 20–32)
CREATININE: 0.95 mg/dL (ref 0.50–1.05)
Calcium: 9.3 mg/dL (ref 8.6–10.4)
Chloride: 104 mmol/L (ref 98–110)
GFR, Est African American: 80 mL/min/{1.73_m2} (ref >=60)
GFR, Est Non African American: 69 mL/min/{1.73_m2} (ref >=60)
GLUCOSE: 91 mg/dL (ref 65–99)
Globulin: 2.8 g/dL (calc) (ref 1.9–3.7)
Potassium: 4.2 mmol/L (ref 3.5–5.3)
Sodium: 138 mmol/L (ref 135–146)
Total Bilirubin: 0.4 mg/dL (ref 0.2–1.2)
Total Protein: 7 g/dL (ref 6.1–8.1)

## 2018-05-27 LAB — CBC WITH DIFFERENTIAL/PLATELET
BASOS PCT: 1.2 %
Basophils Absolute: 79 cells/uL (ref 0–200)
EOS ABS: 79 {cells}/uL (ref 15–500)
Eosinophils Relative: 1.2 %
HEMATOCRIT: 41 % (ref 35.0–45.0)
HEMOGLOBIN: 13.8 g/dL (ref 11.7–15.5)
LYMPHS ABS: 1617 {cells}/uL (ref 850–3900)
MCH: 34 pg — AB (ref 27.0–33.0)
MCHC: 33.7 g/dL (ref 32.0–36.0)
MCV: 101 fL — AB (ref 80.0–100.0)
MPV: 10.5 fL (ref 7.5–12.5)
Monocytes Relative: 6.1 %
NEUTROS ABS: 4422 {cells}/uL (ref 1500–7800)
Neutrophils Relative %: 67 %
Platelets: 283 10*3/uL (ref 140–400)
RBC: 4.06 10*6/uL (ref 3.80–5.10)
RDW: 11.9 % (ref 11.0–15.0)
Total Lymphocyte: 24.5 %
WBC: 6.6 10*3/uL (ref 3.8–10.8)
WBCMIX: 403 {cells}/uL (ref 200–950)

## 2018-05-27 LAB — LIPID PANEL
CHOLESTEROL: 189 mg/dL (ref <200)
HDL: 74 mg/dL (ref >50)
LDL CHOLESTEROL (CALC): 98 mg/dL
Non-HDL Cholesterol (Calc): 115 mg/dL (calc) (ref <130)
TRIGLYCERIDES: 81 mg/dL (ref <150)
Total CHOL/HDL Ratio: 2.6 (calc) (ref <5.0)

## 2018-05-27 LAB — URINALYSIS, ROUTINE W REFLEX MICROSCOPIC
BILIRUBIN URINE: NEGATIVE
Glucose, UA: NEGATIVE
HGB URINE DIPSTICK: NEGATIVE
KETONES UR: NEGATIVE
Leukocytes, UA: NEGATIVE
NITRITE: NEGATIVE
Protein, ur: NEGATIVE
Specific Gravity, Urine: 1.021 (ref 1.001–1.03)
pH: 8 (ref 5.0–8.0)

## 2018-05-27 LAB — HEMOGLOBIN A1C
EAG (MMOL/L): 5.5 (calc)
Hgb A1c MFr Bld: 5.1 % of total Hgb (ref <5.7)
MEAN PLASMA GLUCOSE: 100 (calc)

## 2018-05-27 LAB — VITAMIN B12: Vitamin B-12: 663 pg/mL (ref 200–1100)

## 2018-05-27 LAB — MICROALBUMIN / CREATININE URINE RATIO
Creatinine, Urine: 126 mg/dL (ref 20–275)
MICROALB UR: 0.4 mg/dL
Microalb Creat Ratio: 3 mcg/mg creat (ref <30)

## 2018-05-27 LAB — IRON, TOTAL/TOTAL IRON BINDING CAP
%SAT: 36 % (ref 16–45)
Iron: 148 ug/dL (ref 45–160)
TIBC: 411 ug/dL (ref 250–450)

## 2018-05-27 LAB — VITAMIN D 25 HYDROXY (VIT D DEFICIENCY, FRACTURES): Vit D, 25-Hydroxy: 86 ng/mL (ref 30–100)

## 2018-05-27 LAB — TSH: TSH: 3.69 mIU/L

## 2018-05-27 LAB — MAGNESIUM: MAGNESIUM: 2.1 mg/dL (ref 1.5–2.5)

## 2018-05-27 LAB — INSULIN, RANDOM: Insulin: 2.8 u[IU]/mL (ref 2.0–19.6)

## 2018-05-28 DIAGNOSIS — H43391 Other vitreous opacities, right eye: Secondary | ICD-10-CM | POA: Diagnosis not present

## 2018-05-28 DIAGNOSIS — H43811 Vitreous degeneration, right eye: Secondary | ICD-10-CM | POA: Diagnosis not present

## 2018-05-28 DIAGNOSIS — H5211 Myopia, right eye: Secondary | ICD-10-CM | POA: Diagnosis not present

## 2018-06-01 ENCOUNTER — Other Ambulatory Visit: Payer: Self-pay

## 2018-06-01 DIAGNOSIS — J4520 Mild intermittent asthma, uncomplicated: Secondary | ICD-10-CM

## 2018-06-01 MED ORDER — MONTELUKAST SODIUM 10 MG PO TABS: 10.0000 mg | ORAL_TABLET | Freq: Every day | ORAL | 3 refills | Status: DC

## 2018-06-01 MED ORDER — IMITREX 20 MG/ACT NA SOLN: NASAL | 5 refills | Status: DC

## 2018-06-01 MED ORDER — SUMATRIPTAN SUCCINATE 100 MG PO TABS: ORAL_TABLET | ORAL | 1 refills | Status: DC

## 2018-06-01 MED ORDER — BECLOMETHASONE DIPROP HFA 80 MCG/ACT IN AERB: INHALATION_SPRAY | RESPIRATORY_TRACT | 3 refills | Status: DC

## 2018-06-08 DIAGNOSIS — Z809 Family history of malignant neoplasm, unspecified: Secondary | ICD-10-CM | POA: Diagnosis not present

## 2018-06-11 ENCOUNTER — Other Ambulatory Visit: Payer: Self-pay | Admitting: Obstetrics and Gynecology

## 2018-06-11 DIAGNOSIS — R928 Other abnormal and inconclusive findings on diagnostic imaging of breast: Secondary | ICD-10-CM

## 2018-06-23 ENCOUNTER — Ambulatory Visit
Admission: RE | Admit: 2018-06-23 | Discharge: 2018-06-23 | Disposition: A | Discharge status: Home / Self Care | Payer: 59 | Source: Ambulatory Visit | Attending: Obstetrics and Gynecology | Admitting: Obstetrics and Gynecology

## 2018-06-23 DIAGNOSIS — R928 Other abnormal and inconclusive findings on diagnostic imaging of breast: Secondary | ICD-10-CM

## 2018-06-23 DIAGNOSIS — N6320 Unspecified lump in the left breast, unspecified quadrant: Secondary | ICD-10-CM | POA: Diagnosis not present

## 2018-06-23 MED ORDER — GADOBENATE DIMEGLUMINE 529 MG/ML IV SOLN
15.0000 mL | Freq: Once | INTRAVENOUS | Status: AC | PRN
  Administered 2018-06-23: 15 mL via INTRAVENOUS

## 2018-08-10 IMAGING — MR MR BILATERAL BREAST WITHOUT AND WITH CONTRAST
8 of 12 series · 32 of 48 positions shown · IV contrast (14 ML MULTIHANCE)
Comparison: Previous exam(s).

CLINICAL DATA: Patient presents for high risk screening breast MRI
due to family history of breast cancer.

LABS:  None.
EXAM:
BILATERAL BREAST MRI WITH AND WITHOUT CONTRAST
TECHNIQUE: Multiplanar, multisequence MR images of both breasts were obtained
prior to and following the intravenous administration of 14 ml of
MultiHance.

[Series 3: t2_tirm_tra ipat (a-p) · axial · 3.0mm · 0.66mm/px · 1 of 55 slices shown]
[im 1/55]
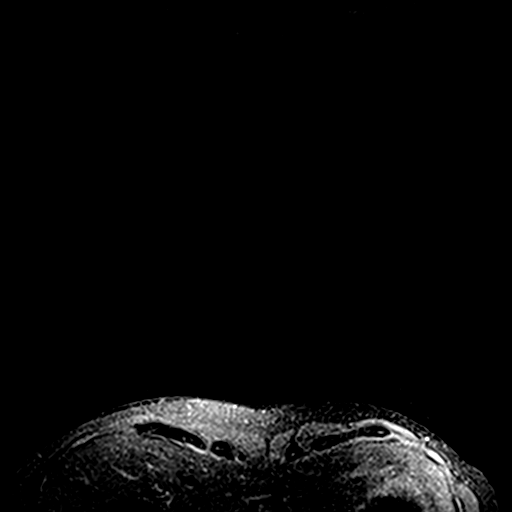

[Series 4: fl3d pre-cm no · axial · non-contrast · 1.2mm · 0.94mm/px · z∈[-61,+111]mm · 5 of 144 slices shown]
[im 1/144]
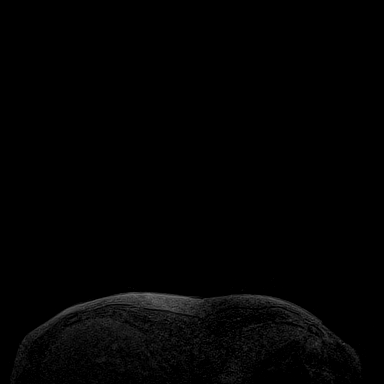
[im 36/144]
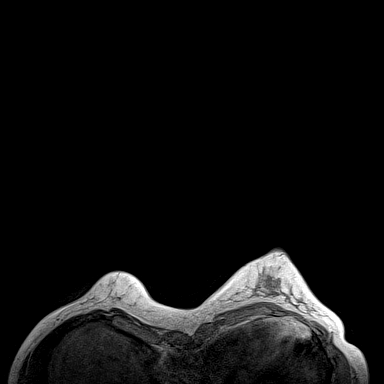
[im 72/144]
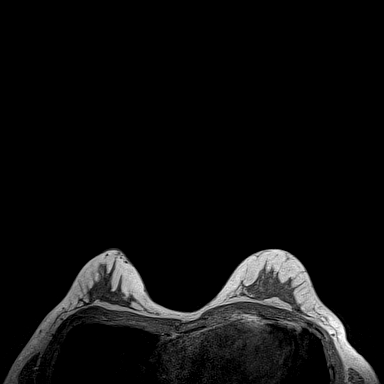
[im 108/144]
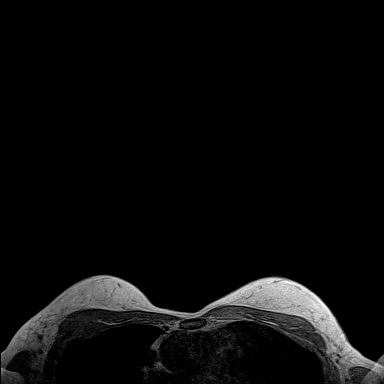
[im 144/144]
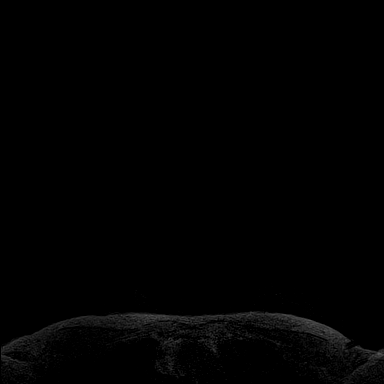

[Series 5: fl3d pre-cm · axial · non-contrast · 1.2mm · 0.94mm/px · z∈[-61,+111]mm · 5 of 144 slices shown]
[im 1/144]
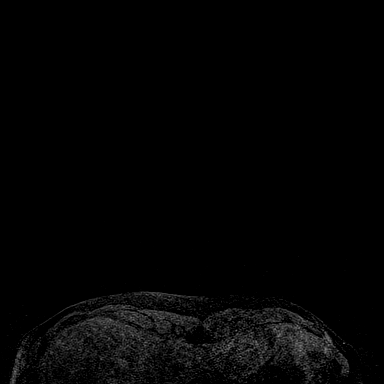
[im 36/144]
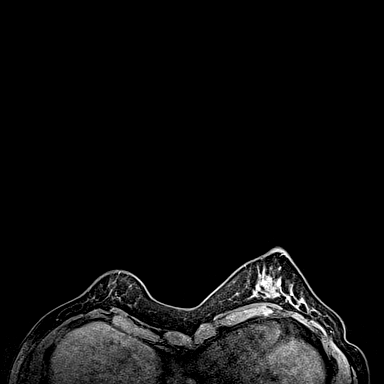
[im 72/144]
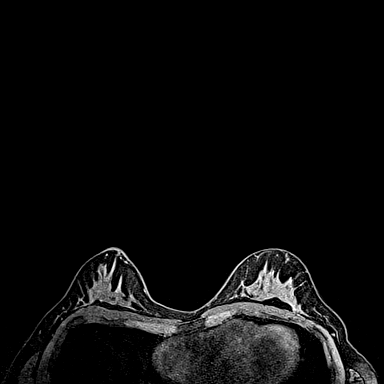
[im 108/144]
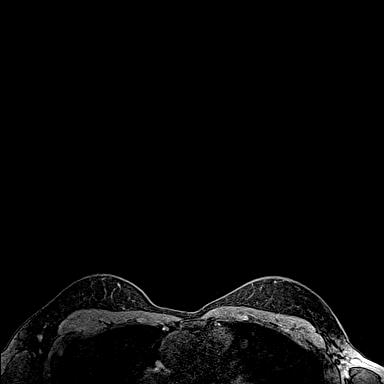
[im 144/144]
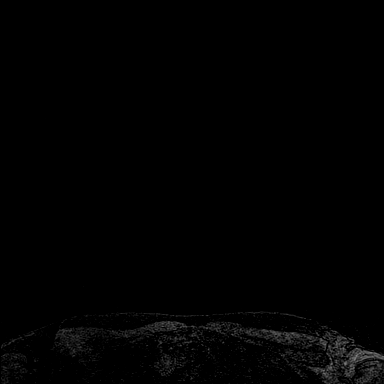

[Series 6: fl3d post immediate · axial · 1.2mm · 0.94mm/px · z∈[-61,+111]mm · 5 of 144 slices shown (1 of 3)]
[im 1/144]
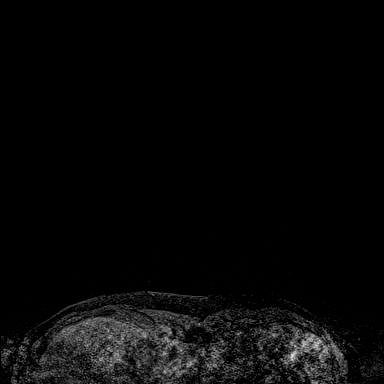
[im 36/144]
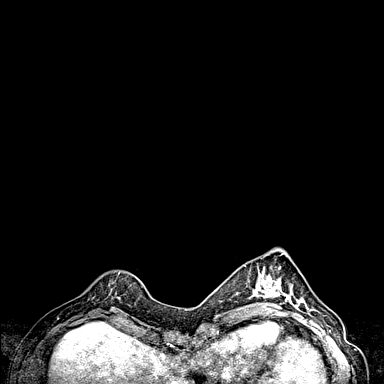
[im 72/144]
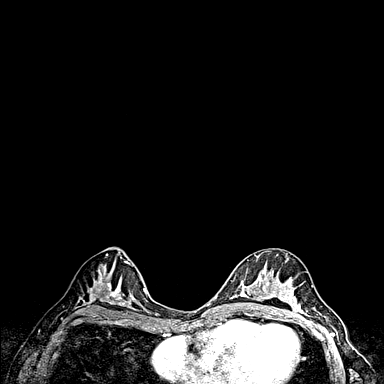
[im 108/144]
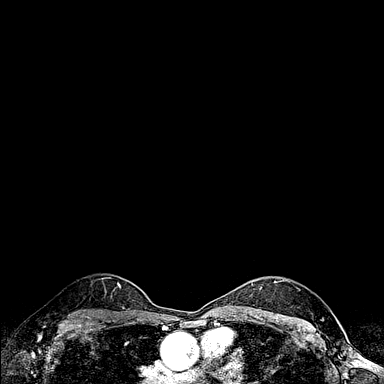
[im 144/144]
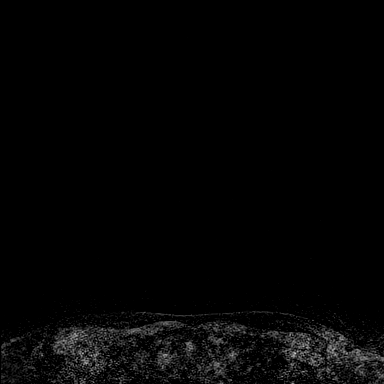

[Series 7: fl3d post immediate · axial · 1.2mm · 0.94mm/px · z∈[-61,+111]mm · 5 of 144 slices shown (2 of 3)]
[im 1/144]
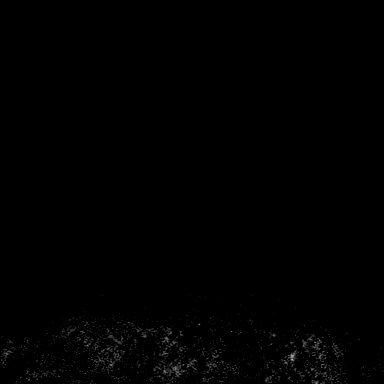
[im 36/144]
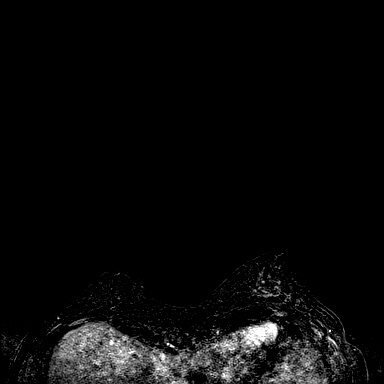
[im 72/144]
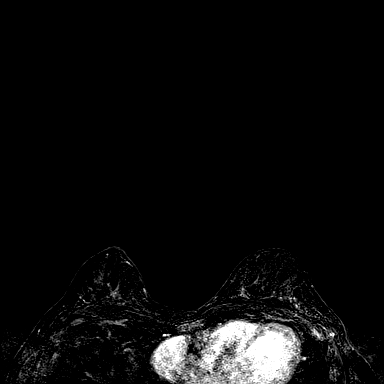
[im 108/144]
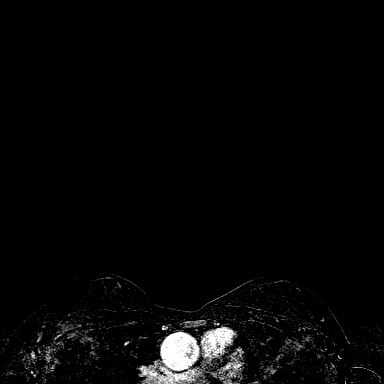
[im 144/144]
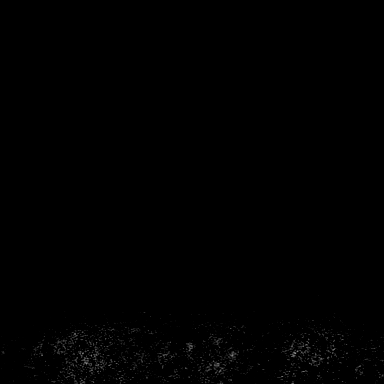

[Series 8: fl3d post immediate · axial · 172.8mm · 0.94mm/px · 1 of 1 slices shown (3 of 3)]
[im 1/1]
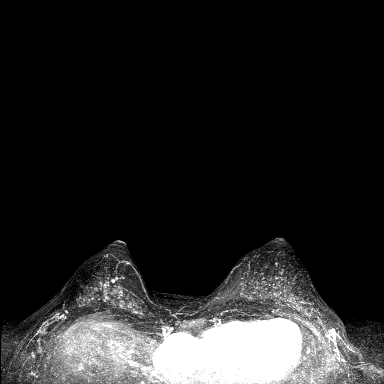

[Series 9: fl3d post 3min · axial · 1.2mm · 0.94mm/px · z∈[-61,+111]mm · 6 of 144 slices shown]
[im 1/144]
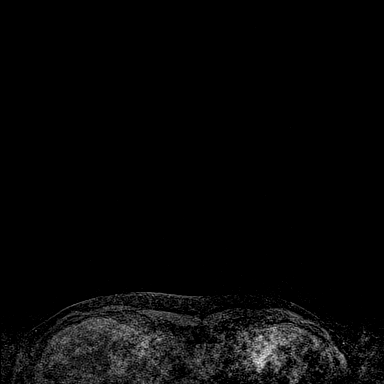
[im 29/144]
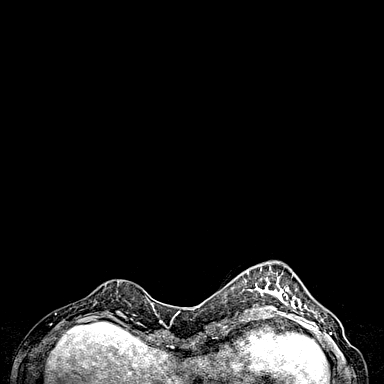
[im 58/144]
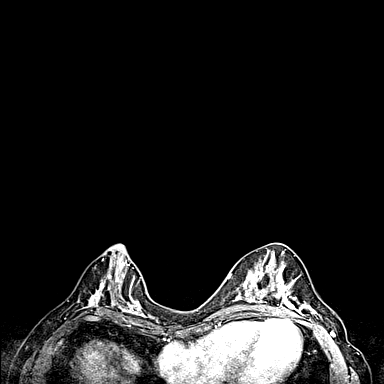
[im 86/144]
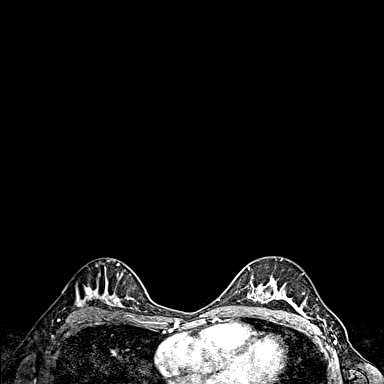
[im 115/144]
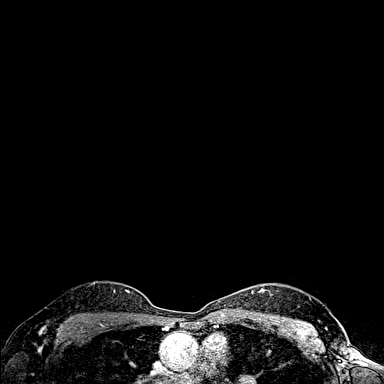
[im 144/144]
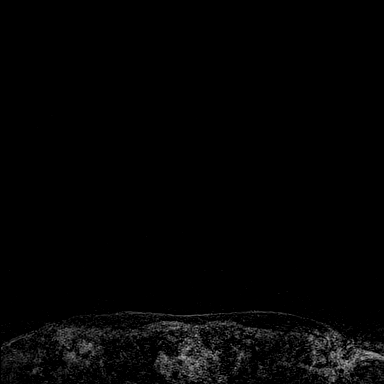

[Series 10: fl3d post 3min_sub · axial · 1.2mm · 0.94mm/px · z∈[-61,+41]mm · 4 of 144 slices shown]
[im 1/144]
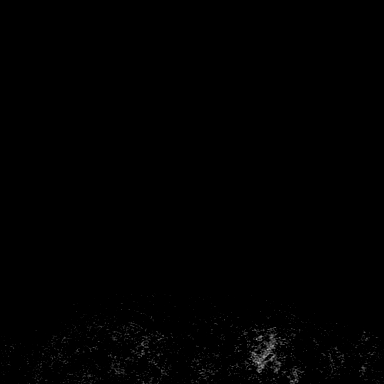
[im 29/144]
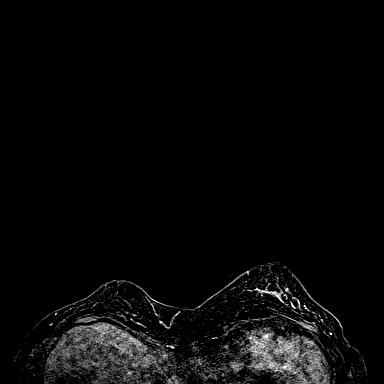
[im 58/144]
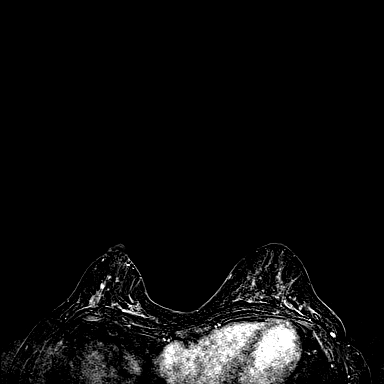
[im 86/144]
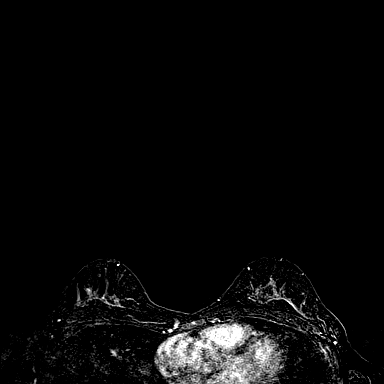

[32 of 48 positions shown; findings below may reference images not displayed]

THREE-DIMENSIONAL MR IMAGE RENDERING ON INDEPENDENT WORKSTATION:

Three-dimensional MR images were rendered by post-processing of the
original MR data on an independent workstation. The
three-dimensional MR images were interpreted, and findings are
reported in the following complete MRI report for this study. Three
dimensional images were evaluated at the independent [employer]
workstation
FINDINGS: Breast composition: c. Heterogeneous fibroglandular tissue.

Background parenchymal enhancement: Moderate.

Right breast: There is focal non mass enhancement over the posterior
third of the upper inner quadrant measuring 0.9 x 1.3 x 1.0 cm with
persistent and plateau kinetics. This is likely focal fibrocystic
change although is indeterminate. There is similar patchy non mass
enhancement over the lower central right breast.

Left breast: No mass or abnormal enhancement.

Lymph nodes: No abnormal appearing lymph nodes.

Ancillary findings:  None.
IMPRESSION: Focal non mass enhancement over the posterior third of the upper
inner quadrant of the right breast measuring 0.9 x 1.3 x 1.0 cm.
Similar patchy non mass enhancement over the lower central right
breast. Findings likely due to focal fibrocystic change, although
indeterminate.

RECOMMENDATION:
Recommend MRI guided core needle biopsy of the focal non mass
enhancement in the upper inner quadrant of the right breast. If
benign, recommend six-month followup breast MRI for surveillance of
the lower central right breast.

BI-RADS CATEGORY  4: Suspicious.

## 2018-08-11 ENCOUNTER — Other Ambulatory Visit: Payer: Self-pay | Admitting: Internal Medicine

## 2018-08-16 ENCOUNTER — Other Ambulatory Visit: Payer: Self-pay | Admitting: Internal Medicine

## 2018-08-16 DIAGNOSIS — G43909 Migraine, unspecified, not intractable, without status migrainosus: Secondary | ICD-10-CM

## 2018-08-16 MED ORDER — SUMATRIPTAN 20 MG/ACT NA SOLN: NASAL | 5 refills | Status: DC

## 2018-08-16 MED ORDER — SUMATRIPTAN SUCCINATE 100 MG PO TABS: ORAL_TABLET | ORAL | 1 refills | Status: DC

## 2018-09-04 ENCOUNTER — Other Ambulatory Visit: Payer: Self-pay | Admitting: Internal Medicine

## 2018-09-04 ENCOUNTER — Other Ambulatory Visit: Payer: Self-pay | Admitting: *Deleted

## 2018-09-04 DIAGNOSIS — G43909 Migraine, unspecified, not intractable, without status migrainosus: Secondary | ICD-10-CM

## 2018-09-04 MED ORDER — METRONIDAZOLE 1 % EX CREA: TOPICAL_CREAM | Freq: Every day | CUTANEOUS | 0 refills | Status: DC

## 2018-09-04 MED ORDER — SUMATRIPTAN SUCCINATE 100 MG PO TABS: ORAL_TABLET | ORAL | 1 refills | Status: DC

## 2018-09-04 MED ORDER — MONTELUKAST SODIUM 10 MG PO TABS: 10.0000 mg | ORAL_TABLET | Freq: Every day | ORAL | 3 refills | Status: DC

## 2018-09-06 ENCOUNTER — Other Ambulatory Visit: Payer: Self-pay | Admitting: Internal Medicine

## 2018-09-06 DIAGNOSIS — G43909 Migraine, unspecified, not intractable, without status migrainosus: Secondary | ICD-10-CM

## 2018-09-06 DIAGNOSIS — L719 Rosacea, unspecified: Secondary | ICD-10-CM

## 2018-09-06 MED ORDER — SUMATRIPTAN SUCCINATE 100 MG PO TABS: ORAL_TABLET | ORAL | 1 refills | Status: DC

## 2018-09-06 MED ORDER — METRONIDAZOLE 1 % EX CREA: TOPICAL_CREAM | Freq: Every day | CUTANEOUS | 0 refills | Status: DC

## 2018-10-19 ENCOUNTER — Other Ambulatory Visit: Payer: Self-pay | Admitting: Internal Medicine

## 2018-12-18 ENCOUNTER — Other Ambulatory Visit: Payer: Self-pay | Admitting: Adult Health

## 2019-01-24 ENCOUNTER — Other Ambulatory Visit: Payer: Self-pay | Admitting: Adult Health

## 2019-05-15 ENCOUNTER — Other Ambulatory Visit: Payer: Self-pay | Admitting: Physician Assistant

## 2019-05-17 ENCOUNTER — Encounter: Payer: Self-pay | Admitting: Physician Assistant

## 2019-05-17 ENCOUNTER — Other Ambulatory Visit: Payer: Self-pay

## 2019-05-17 ENCOUNTER — Ambulatory Visit: Payer: 59 | Admitting: Physician Assistant

## 2019-05-17 VITALS — BP 124/82 | HR 73 | Temp 97.7°F | Ht 68.25 in | Wt 162.4 lb

## 2019-05-17 DIAGNOSIS — M545 Low back pain, unspecified: Secondary | ICD-10-CM

## 2019-05-17 DIAGNOSIS — F0781 Postconcussional syndrome: Secondary | ICD-10-CM | POA: Diagnosis not present

## 2019-05-17 DIAGNOSIS — S060X0A Concussion without loss of consciousness, initial encounter: Secondary | ICD-10-CM

## 2019-05-17 MED ORDER — METHOCARBAMOL 500 MG PO TABS: 500.0000 mg | ORAL_TABLET | Freq: Two times a day (BID) | ORAL | 2 refills | Status: DC | PRN

## 2019-05-17 MED ORDER — MELOXICAM 15 MG PO TABS: ORAL_TABLET | ORAL | 1 refills | Status: DC

## 2019-05-17 NOTE — Patient Instructions (Signed)
BACK PAIN  Try the exercises and other information in the back care manual.   You can take meloxicam once during the day as needed (avoid taking other NSAIDS like Alleve or Ibuprofen while taking this)   You can take flexeril if needed at bedtime for muscle spasm. This can be taken up to every 8 hours, but causes sedation, so should not drive or operate heavy machinery while taking this medicine.   Go to the ER if you have any new weakness in your legs, have trouble controlling your urine or bowels, or have worsening pain.   If you are not better in 1-3 month we will refer you to ortho   Back pain Rehab Ask your health care provider which exercises are safe for you. Do exercises exactly as told by your health care provider and adjust them as directed. It is normal to feel mild stretching, pulling, tightness, or discomfort as you do these exercises, but you should stop right away if you feel sudden pain or your pain gets worse. Do not begin these exercises until told by your health care provider. Stretching and range of motion exercises These exercises warm up your muscles and joints and improve the movement and flexibility of your hips and your back. These exercises also help to relieve pain, numbness, and tingling. Exercise A: Sciatic nerve glide 1. Sit in a chair with your head facing down toward your chest. Place your hands behind your back. Let your shoulders slump forward. 2. Slowly straighten one of your knees while you tilt your head back as if you are looking toward the ceiling. Only straighten your leg as far as you can without making your symptoms worse. 3. Hold for __________ seconds. 4. Slowly return to the starting position. 5. Repeat with your other leg. Repeat __________ times. Complete this exercise __________ times a day. Exercise B: Knee to chest with hip adduction and internal rotation  1. Lie on your back on a firm surface with both legs straight. 2. Bend one of your  knees and move it up toward your chest until you feel a gentle stretch in your lower back and buttock. Then, move your knee toward the shoulder that is on the opposite side from your leg. ? Hold your leg in this position by holding onto the front of your knee. 3. Hold for __________ seconds. 4. Slowly return to the starting position. 5. Repeat with your other leg. Repeat __________ times. Complete this exercise __________ times a day. Exercise C: Prone extension on elbows  1. Lie on your abdomen on a firm surface. A bed may be too soft for this exercise. 2. Prop yourself up on your elbows. 3. Use your arms to help lift your chest up until you feel a gentle stretch in your abdomen and your lower back. ? This will place some of your body weight on your elbows. If this is uncomfortable, try stacking pillows under your chest. ? Your hips should stay down, against the surface that you are lying on. Keep your hip and back muscles relaxed. 4. Hold for __________ seconds. 5. Slowly relax your upper body and return to the starting position. Repeat __________ times. Complete this exercise __________ times a day. Strengthening exercises These exercises build strength and endurance in your back. Endurance is the ability to use your muscles for a long time, even after they get tired. Exercise D: Pelvic tilt 1. Lie on your back on a firm surface. Bend your knees and keep  your feet flat. 2. Tense your abdominal muscles. Tip your pelvis up toward the ceiling and flatten your lower back into the floor. ? To help with this exercise, you may place a small towel under your lower back and try to push your back into the towel. 3. Hold for __________ seconds. 4. Let your muscles relax completely before you repeat this exercise. Repeat __________ times. Complete this exercise __________ times a day. Exercise E: Alternating arm and leg raises  1. Get on your hands and knees on a firm surface. If you are on a hard  floor, you may want to use padding to cushion your knees, such as an exercise mat. 2. Line up your arms and legs. Your hands should be below your shoulders, and your knees should be below your hips. 3. Lift your left leg behind you. At the same time, raise your right arm and straighten it in front of you. ? Do not lift your leg higher than your hip. ? Do not lift your arm higher than your shoulder. ? Keep your abdominal and back muscles tight. ? Keep your hips facing the ground. ? Do not arch your back. ? Keep your balance carefully, and do not hold your breath. 4. Hold for __________ seconds. 5. Slowly return to the starting position and repeat with your right leg and your left arm. Repeat __________ times. Complete this exercise __________ times a day. Posture and body mechanics  Body mechanics refers to the movements and positions of your body while you do your daily activities. Posture is part of body mechanics. Good posture and healthy body mechanics can help to relieve stress in your body's tissues and joints. Good posture means that your spine is in its natural S-curve position (your spine is neutral), your shoulders are pulled back slightly, and your head is not tipped forward. The following are general guidelines for applying improved posture and body mechanics to your everyday activities. Standing   When standing, keep your spine neutral and your feet about hip-width apart. Keep a slight bend in your knees. Your ears, shoulders, and hips should line up.  When you do a task in which you stand in one place for a long time, place one foot up on a stable object that is 2-4 inches (5-10 cm) high, such as a footstool. This helps keep your spine neutral. Sitting   When sitting, keep your spine neutral and keep your feet flat on the floor. Use a footrest, if necessary, and keep your thighs parallel to the floor. Avoid rounding your shoulders, and avoid tilting your head forward.  When  working at a desk or a computer, keep your desk at a height where your hands are slightly lower than your elbows. Slide your chair under your desk so you are close enough to maintain good posture.  When working at a computer, place your monitor at a height where you are looking straight ahead and you do not have to tilt your head forward or downward to look at the screen. Resting   When lying down and resting, avoid positions that are most painful for you.  If you have pain with activities such as sitting, bending, stooping, or squatting (flexion-based activities), lie in a position in which your body does not bend very much. For example, avoid curling up on your side with your arms and knees near your chest (fetal position).  If you have pain with activities such as standing for a long time or reaching  with your arms (extension-based activities), lie with your spine in a neutral position and bend your knees slightly. Try the following positions: ? Lying on your side with a pillow between your knees. ? Lying on your back with a pillow under your knees. Lifting   When lifting objects, keep your feet at least shoulder-width apart and tighten your abdominal muscles.  Bend your knees and hips and keep your spine neutral. It is important to lift using the strength of your legs, not your back. Do not lock your knees straight out.  Always ask for help to lift heavy or awkward objects. This information is not intended to replace advice given to you by your health care provider. Make sure you discuss any questions you have with your health care provider. Document Released: 10/28/2005 Document Revised: 07/04/2016 Document Reviewed: 07/14/2015 Elsevier Interactive Patient Education  2018 [place], [place]  A concussion is a brain injury from a hard, direct hit (trauma) to the head or body. This direct hit causes the brain to shake quickly back and forth inside the skull. This can  damage brain cells and cause chemical changes in the brain. A concussion may also be known as a mild traumatic brain injury (TBI). Concussions are usually not life-threatening, but the effects of a concussion can be serious. If you have a concussion, you should be very careful to avoid having a second concussion. What are the causes? This condition is caused by:  A direct hit to your head, such as: ? Running into another player during a game. ? Being hit in a fight. ? Hitting your head on a hard surface.  Sudden movement of your body that causes your brain to move back and forth inside the skull, such as in a car crash. What are the signs or symptoms? The signs of a concussion can be hard to notice. Early on, they may be missed by you, family members, and health care providers. You may look fine on the outside but may act or feel differently. Symptoms are usually temporary and most often improve in 7-10 days. Some symptoms appear right away, but other symptoms may not show up for hours or days. If your symptoms last longer than normal, you may have post-concussion syndrome. Every head injury is different. Physical symptoms  Headaches. This can include a feeling of pressure in the head or migraine-like symptoms.  Tiredness (fatigue).  Dizziness.  Problems with coordination or balance.  Vision or hearing problems.  Sensitivity to light or noise.  Nausea or vomiting.  Changes in eating or sleeping patterns.  Numbness or tingling.  Seizure. Mental and emotional symptoms  Memory problems.  Trouble concentrating, organizing, or making decisions.  Slowness in thinking, acting or reacting, speaking, or reading.  Irritability or mood changes.  Anxiety or depression. How is this diagnosed? This condition is diagnosed based on:  Your symptoms.  A description of your injury. You may also have tests, including:  Imaging tests, such as a CT scan or MRI.  Neuropsychological  tests. These measure your thinking, understanding, learning, and remembering abilities. How is this treated? Treatment for this condition includes:  Stopping sports or activity if you are injured. If you hit your head or show signs of concussion: ? Do not return to sports or activities the same day. ? Get checked by a health care provider before you return to your activities.  Physical and mental rest and careful observation, usually at home. Gradually return to  your normal activities.  Medicines to help with symptoms such as headaches, nausea, or difficulty sleeping. ? Avoid taking opioid pain medicine while recovering from a concussion.  Avoiding alcohol and drugs. These may slow your recovery and can put you at risk of further injury.  Referral to a concussion clinic or rehabilitation center. Recovery from a concussion can take time. How fast you recover depends on many factors. Return to activities only when:  Your symptoms are completely gone.  Your health care provider says that it is safe. Follow these instructions at home: Activity  Limit activities that require a lot of thought or concentration, such as: ? Doing homework or job-related work. ? Watching TV. ? Working on the computer or phone. ? Playing memory games and puzzles.  Rest. Rest helps your brain heal. Make sure you: ? Get plenty of sleep. Most adults should get 7-9 hours of sleep each night. ? Rest during the day. Take naps or rest breaks when you feel tired.  Avoid physical activity like exercise until your health care provider says it is safe. Stop any activity that worsens symptoms.  Do not do high-risk activities that could cause a second concussion, such as riding a bike or playing sports.  Ask your health care provider when you can return to your normal activities, such as school, work, athletics, and driving. Your ability to react may be slower after a brain injury. Never do these activities if you are  dizzy. Your health care provider will likely give you a plan for gradually returning to activities. General instructions   Take over-the-counter and prescription medicines only as told by your health care provider. Some medicines, such as blood thinners (anticoagulants) and aspirin, may increase the risk for complications, such as bleeding.  Do not drink alcohol until your health care provider says you can.  Watch your symptoms and tell others around you to do the same. Complications sometimes occur after a concussion. Older adults with a brain injury may have a higher risk of serious complications.  Tell your work Freight forwarder, teachers, Government social research officer, school counselor, coach, or Product/process development scientist about your injury, symptoms, and restrictions.  Keep all follow-up visits as told by your health care provider. This is important. How is this prevented? Avoiding another brain injury is very important. In rare cases, another injury can lead to permanent brain damage, brain swelling, or death. The risk of this is greatest during the first 7-10 days after a head injury. Avoid injuries by:  Stopping activities that could lead to a second concussion, such as contact or recreational sports, until your health care provider says it is okay.  Taking these actions once you have returned to sports or activities: ? Avoiding plays or moves that can cause you to crash into another person. This is how most concussions occur. ? Following the rules and being respectful of other players. Do not engage in violent or illegal plays.  Getting regular exercise that includes strength and balance training.  Wearing a properly fitting helmet during sports, biking, or other activities. Helmets can help protect you from serious skull and brain injuries, but they do not protect you from a concussion. Even when wearing a helmet, you should avoid being hit in the head. Contact a health care provider if:  Your symptoms get worse or  they do not improve.  You have new symptoms.  You have another injury. Get help right away if:  You have severe or worsening headaches.  You have  weakness or numbness in any part of your body.  You are confused.  Your coordination gets worse.  You vomit repeatedly.  You are sleepier than normal.  Your speech is slurred.  You cannot recognize people or places.  You have a seizure.  It is difficult to wake you up.  You have unusual behavior changes.  You have changes in your vision.  You lose consciousness. Summary  A concussion is a brain injury that results from a hard, direct hit (trauma) to your head or body.  You may have imaging tests and neuropsychological tests to diagnose a concussion.  Treatment for this condition includes physical and mental rest and careful observation.  Ask your health care provider when you can return to your normal activities, such as school, work, athletics, and driving.  Get help right away if you have a severe headache, weakness on one side of the body, seizures, behavior changes, changes in vision, or if you are confused or sleepier than normal. This information is not intended to replace advice given to you by your health care provider. Make sure you discuss any questions you have with your health care provider. Document Released: 01/18/2004 Document Revised: 06/18/2018 Document Reviewed: 06/18/2018 Elsevier Patient Education  2020 [employer].

## 2019-05-17 NOTE — Progress Notes (Signed)
Subjective:    Patient ID: Nicole Waller, female    DOB: September 09, 1966, 53 y.o.   MRN: [id number]  HPI 53 y.o. WF with history of TBI 6 years ago presents after fall off horse.  She was on her younger horse, she is working with him and an trailer came down the road and the horse started to run side ways.  She states while trying to get off while the horse was in movement, the horse bucked and hit her in the head with his head and then she fell off onto her lower back and occipital lower head.  No LOC. She states she has been sore, had double vision yesterday. She has been having fatigue. She states she feels foggy yesterday and today. No severe HA.     States since her injury in 2014 she states she has had continuing cognitive issues, has changed personality, changed taste, short term memroy issues. Has issues with simple math. Has seen neuropsych. Husband confirms patient's story.  No changes prior to the fall.  Has appointment with Dr. Tomi Likens on Monday. Has seen Dr. Si Raider in the past.. She is on B complex.  No tick exposure but also changes clearly related to flal.  Normal CT 2014 MRI brain 2014 MRA normal 2014  Blood pressure 124/82, pulse 73, temperature 97.7 F (36.5 C), height 5' 8.25" (1.734 m), weight 162 lb 6.4 oz (73.7 kg), SpO2 99 %.  Medications Current Outpatient Medications on File Prior to Visit  Medication Sig  . albuterol (PROAIR HFA) 108 (90 Base) MCG/ACT inhaler 2 puffs every 4 hours as needed only  if your can't catch your breath  . Amphetamine ER (ADZENYS XR-ODT) 9.4 MG TBED Take 1 tablet by mouth daily.  Marland Kitchen aspirin 81 MG tablet Take 81 mg by mouth. Take one pill every 4 days  . B Complex Vitamins (B COMPLEX-B12) TABS Take 1 tablet by mouth daily.  . beclomethasone (QVAR REDIHALER) 80 MCG/ACT inhaler Use 2 inhalations 1 to 2 x/ day for Asthma prophylaxis.  . Calcium 600-200 MG-UNIT per tablet Take 1 tablet by mouth every other day. Every three days  . cetirizine  (ZYRTEC) 10 MG tablet Take 10 mg by mouth daily.  . cholecalciferol (VITAMIN D) 1000 UNITS tablet 2,000 Units. 1 tablet daily  . Coenzyme Q10 (COQ10) 100 MG CAPS Take by mouth daily.  . IMITREX 100 MG tablet Take 1 tablet at onset of Migraine and may repeat 1 x in 2 hours (Max 2 tablets /24 hours)  . MAGNESIUM GLYCINATE PLUS PO Take 400 mg by mouth 2 (two) times daily.  . metronidazole (NORITATE) 1 % cream Apply topically daily.  . montelukast (SINGULAIR) 10 MG tablet Take 1 tablet (10 mg total) by mouth daily.  . Multiple Vitamin (MULTIVITAMIN) capsule Take 1 capsule by mouth daily.    . NON FORMULARY Metro lotion prn for rosecea  . norgestrel-ethinyl estradiol (CRYSELLE-28) 0.3-30 MG-MCG tablet Take 1 tablet by mouth daily.  . Omega-3 Fatty Acids (FISH OIL) 1000 MG CAPS Take 1,000 mg by mouth daily.  . SUMAtriptan (IMITREX) 20 MG/ACT nasal spray May repeat in 2 hours if headache persists or recurs.  . valACYclovir (VALTREX) 500 MG tablet Take 500 mg by mouth daily as needed (for outbreak).    No current facility-administered medications on file prior to visit.     Problem list She has PULMONARY NODULE; EPIGASTRIC PAIN; ABNORMAL EKG; Cough variant asthma; Anxiety; Hx of migraines; Rosacea; History of nephrolithiasis; Vitamin  D deficiency; Medication management; CKD Stage 2 (GFR 72 ml/min); Allergic rhinitis; TBI (traumatic brain injury) ([place]); Elevated BP, Screening; Elevated cholesterol, screening; Other abnormal glucose, screening; Allergic asthma; and Benign neoplasm of descending colon on their problem list.   Review of Systems  Constitutional: Positive for fatigue. Negative for activity change, appetite change, chills, diaphoresis, fever and unexpected weight change.  HENT: Negative.   Respiratory: Negative.  Negative for shortness of breath.   Cardiovascular: Negative.  Negative for chest pain and leg swelling.  Gastrointestinal: Negative.   Musculoskeletal: Positive for back pain,  myalgias and neck pain. Negative for arthralgias, gait problem, joint swelling and neck stiffness.  Skin: Negative.  Negative for rash and wound.  Neurological: Positive for speech difficulty and headaches. Negative for dizziness, tremors, seizures, syncope, facial asymmetry, weakness, light-headedness and numbness.  Psychiatric/Behavioral: Positive for confusion and decreased concentration. Negative for agitation, behavioral problems, dysphoric mood, hallucinations, self-injury, sleep disturbance and suicidal ideas. The patient is not nervous/anxious and is not hyperactive.   All other systems reviewed and are negative.      Objective:   Physical Exam Constitutional:      Appearance: She is well-developed.  HENT:     Head: Normocephalic and atraumatic.     Right Ear: External ear normal.     Left Ear: External ear normal.  Eyes:     General: No visual field deficit.    Conjunctiva/sclera: Conjunctivae normal.     Pupils: Pupils are equal, round, and reactive to light.  Neck:     Musculoskeletal: Normal range of motion and neck supple.     Thyroid: No thyromegaly.  Cardiovascular:     Rate and Rhythm: Normal rate and regular rhythm.     Heart sounds: Normal heart sounds. No murmur. No friction rub. No gallop.   Pulmonary:     Effort: Pulmonary effort is normal. No respiratory distress.     Breath sounds: Normal breath sounds. No wheezing.  Abdominal:     General: Bowel sounds are normal. There is no distension.     Palpations: Abdomen is soft. There is no mass.     Tenderness: There is no abdominal tenderness. There is no guarding or rebound.  Musculoskeletal: Normal range of motion.  Lymphadenopathy:     Cervical: No cervical adenopathy.  Skin:    General: Skin is warm and dry.  Neurological:     General: No focal deficit present.     Mental Status: She is alert and oriented to person, place, and time. Mental status is at baseline.     Cranial Nerves: Cranial nerves are  intact. No cranial nerve deficit or facial asymmetry.     Sensory: Sensation is intact.     Motor: Motor function is intact. No weakness, tremor or pronator drift.     Coordination: Coordination is intact. Romberg sign negative. Coordination normal. Finger-Nose-Finger Test normal. Rapid alternating movements normal.     Gait: Gait is intact. Gait normal.     Deep Tendon Reflexes: Reflexes normal.  Psychiatric:        Attention and Perception: Attention and perception normal.        Mood and Affect: Affect is angry.        Assessment & Plan:  Vitalia was seen today for acute visit.  Diagnoses and all orders for this visit:  Concussion without loss of consciousness, initial encounter ? CTE (chronic traumatic encephalopathy) Patient has follow up with Dr. Tomi Likens Normal neuro exam Normal imaging in 2014  MRA/MRI and CT head ?CTE, offered referral to tertiary center if they want Patient and husband very upset, had abrupt change in personality and cognition 4 weeks after fall in 2014 and feels they have never gotten answers Declines lab tests She was informed to call 911 if she develop any new symptoms such as worsening headaches, episodes of blurred vision, double vision or complete loss of vision or speech difficulties or motor weakness.   Acute bilateral low back pain without sciatica -     methocarbamol (ROBAXIN) 500 MG tablet; Take 1 tablet (500 mg total) by mouth 2 (two) times daily as needed for muscle spasms. -     meloxicam (MOBIC) 15 MG tablet; Take one daily with food for 2 weeks, can take with tylenol, can not take with aleve, iburpofen, then as needed daily for pain Normal exam, monitor for symptoms .erbck

## 2019-05-24 ENCOUNTER — Ambulatory Visit: Payer: 59 | Admitting: Neurology

## 2019-05-31 NOTE — Progress Notes (Signed)
NEUROLOGY FOLLOW UP OFFICE NOTE  Nicole Waller [id number]  HISTORY OF PRESENT ILLNESS: Nicole Waller is a 53 year old right-handed female who follows up for new concussion.  She is accompanied by her husband who supplements history.  UPDATE: Last seen in 2017 for persisting postconcussion syndrome.  At the time, she was referred for neuropsychological rehabilitation/cognitive behavioral therapy.  She went to CBT for a short time but didn't feel she got anything from it.  She didn't think the therapist was a good fit.  While she still had some memory issues, she had been doing well.  On 05/23/19, she was on a horse and the horse became startled by a trailer driving down the road.  The horse bucked and hit her on the brim of her helmet with his head.  She fell on her back and hit the back of her head on the ground.  She was still holding the rein and was being pulled for short distance.  The entire episode was quick.  No loss of consciousness.  She was wearing her helmet.  She later had double vision and her head felt heavy.  She also felt sleepy.  She took Tylenol and ibuprofen and rested with ice pack. She loses track of days of weeks, but otherwise no loss of time.  She has increased word-finding difficulty.  She made some paraphasic errors such as exemption for exclusion.  Worse short-term memory.  Repeating statements or questions.  She notes some slight double vision when looking at objects.     HISTORY: She is a horse rider. On 04/13/13, she sustained a concussion when her horse became agitated and knocked her on the right side of her head and face. She denied loss of consciousness. She sustained swelling around her right eye and nose bleed. She went to the ED at [facility] for further evaluation. CT of head and maxillofacial were personally reviewed and revealed no acute intracranial abnormalities or fracture. Due to persistent headache, she had follow up imaging performed that year. 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 About 4 to 6 weeks after the injury, she began to experience cognitive problems. She started having short-term memory deficits, as well as word-finding problems. Initially, she was treated for postconcussion syndrome and prescribed gabapentin for headache. She developed blurred vision in her right eye and was told by her eye doctor that her eyes were slightly misaligned. She later had an MRI of the brain with and without contrast performed on 05/16/13, which again demonstrated the incidental lipoma but no evidence of brain injury.  She underwent neuro-feedback. 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 She was initially on Adderall XR, but was switched to Adzenys due to side effects. She subsequently underwent neuropsychological testing at [facility] on 07/07/15. Test results revealed average or above average performance across most cognitive domains and thinking skills, with only exceptions being mildly slowed mentation thought to be secondary to mood and possibly certain medication side effects. Testing did not demonstrate cognitive disorder and was not consistent with any organic cognitive impairment that would be explained from her head trauma.   She reports short-term memory problems such as often misplacing things. She would second guess herself about whether she had locked the door or turned off the lights before she left. She often will repeat questions, even just 10 minutes later. If she  is performing a task and is interrupted, she will forget what she was doing. She also developed longer term memory problems as well. Once, she got lost while driving just 2 miles from home. She has trouble recognizing colleagues she works with. Following the injury, she had to relearn to tie her shoes and ride a horse. She  struggles to remember how to perform the riding test in order to qualify to compete in the nationals. 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. She needs to constantly take notes. She needs to use a calculator 100% of the time. She has not made any consequential mistakes at work that have jeopardized her job.   She also reports both word-finding difficulty and trouble getting words out. She knows sign language. Initially, she started signing when she couldn't communicate verbally. She reports change in personality and behavior. She had developed attention deficit symptoms.  Since the accident, symptoms have been stable, if not only slightly improved.  She underwent neuropsychological testing on 05/02/16.  Results of testing generally fell within normal limits and did not demonstrate cognitive impairment.  She did demonstrate mild decrements in psychomotor processing speed.  Initially she demonstrated mild decrement in encoding of non-contextual information, this significantly improved by repetition of the learned information.    Psychological testing demonstrated moderate major depressive disorder as well as high level of concern regarding her cognitive abilities.  She takes B12 supplement.  PAST MEDICAL HISTORY: Past Medical History:  Diagnosis Date   Abdominal pain    in past   Abnormal chest CT    in past   Abnormal EKG    Anxiety    since head injury   Chronic cough    Chronic headache    Concussion    past injury from horse   Dyspnea    Exercise-induced asthma    Head injury 04/2013   due to horse injury/ hit backwards by horse head!   History of nephrolithiasis    Hx of migraines    Post-operative nausea and vomiting    Pulmonary nodule 2011   Rosacea     MEDICATIONS: Current Outpatient Medications on File Prior to Visit  Medication Sig Dispense Refill   albuterol (PROAIR HFA) 108 (90 Base) MCG/ACT inhaler 2  puffs every 4 hours as needed only  if your can't catch your breath 1 Inhaler 2   Amphetamine ER (ADZENYS XR-ODT) 9.4 MG TBED Take 1 tablet by mouth daily.     aspirin 81 MG tablet Take 81 mg by mouth. Take one pill every 4 days     B Complex Vitamins (B COMPLEX-B12) TABS Take 1 tablet by mouth daily.     beclomethasone (QVAR REDIHALER) 80 MCG/ACT inhaler Use 2 inhalations 1 to 2 x/ day for Asthma prophylaxis. 3 Inhaler 3   Calcium 600-200 MG-UNIT per tablet Take 1 tablet by mouth every other day. Every three days     cetirizine (ZYRTEC) 10 MG tablet Take 10 mg by mouth daily.     cholecalciferol (VITAMIN D) 1000 UNITS tablet 2,000 Units. 1 tablet daily     Coenzyme Q10 (COQ10) 100 MG CAPS Take by mouth daily.     IMITREX 100 MG tablet Take 1 tablet at onset of Migraine and may repeat 1 x in 2 hours (Max 2 tablets /24 hours) 9 tablet 3   MAGNESIUM GLYCINATE PLUS PO Take 400 mg by mouth 2 (two) times daily.     meloxicam (  MOBIC) 15 MG tablet Take one daily with food for 2 weeks, can take with tylenol, can not take with aleve, iburpofen, then as needed daily for pain 30 tablet 1   methocarbamol (ROBAXIN) 500 MG tablet Take 1 tablet (500 mg total) by mouth 2 (two) times daily as needed for muscle spasms. 30 tablet 2   metronidazole (NORITATE) 1 % cream Apply topically daily. 60 g 0   montelukast (SINGULAIR) 10 MG tablet Take 1 tablet (10 mg total) by mouth daily. 90 tablet 3   Multiple Vitamin (MULTIVITAMIN) capsule Take 1 capsule by mouth daily.       NON FORMULARY Metro lotion prn for rosecea     norgestrel-ethinyl estradiol (CRYSELLE-28) 0.3-30 MG-MCG tablet Take 1 tablet by mouth daily.     Omega-3 Fatty Acids (FISH OIL) 1000 MG CAPS Take 1,000 mg by mouth daily.     SUMAtriptan (IMITREX) 20 MG/ACT nasal spray May repeat in 2 hours if headache persists or recurs. 6 Inhaler 5   valACYclovir (VALTREX) 500 MG tablet Take 500 mg by mouth daily as needed (for outbreak).      No  current facility-administered medications on file prior to visit.     ALLERGIES: Allergies  Allergen Reactions   Accolate [Zafirlukast]     GI Upset   Codeine     REACTION: nausea and vomiting   Lyrica [Pregabalin]     Fatigue   Oxycodone     nauseated   Probiotic [Acidophilus]     Headache   Tape     Causes redness, and skin gets raw!    FAMILY HISTORY: Family History  Problem Relation Age of Onset   Colon polyps Father    Colitis Father    Heart disease Father    Hypertension Father    Hyperlipidemia Father    Breast cancer Maternal Grandmother    Cancer Maternal Grandmother 7       Breast,uterine,ovarian   Colon polyps Mother    Diabetes Mother    Rheum arthritis Mother    Lupus Mother    Diabetes Paternal Grandfather    Hypertension Sister    Hyperlipidemia Sister    SOCIAL HISTORY: Social History   Socioeconomic History   Marital status: Married    Spouse name: Not on file   Number of children: Not on file   Years of education: Not on file   Highest education level: Not on file  Occupational History   Not on file  Social Needs   Financial resource strain: Not on file   Food insecurity    Worry: Not on file    Inability: Not on file   Transportation needs    Medical: Not on file    Non-medical: Not on file  Tobacco Use   Smoking status: Never Smoker   Smokeless tobacco: Never Used  Substance and Sexual Activity   Alcohol use: No   Drug use: No   Sexual activity: Not on file  Lifestyle   Physical activity    Days per week: Not on file    Minutes per session: Not on file   Stress: Not on file  Relationships   Social connections    Talks on phone: Not on file    Gets together: Not on file    Attends religious service: Not on file    Active member of club or organization: Not on file    Attends meetings of clubs or organizations: Not on file    Relationship  status: Not on file   Intimate partner  violence    Fear of current or ex partner: Not on file    Emotionally abused: Not on file    Physically abused: Not on file    Forced sexual activity: Not on file  Other Topics Concern   Not on file  Social History Narrative   Not on file    REVIEW OF SYSTEMS: Constitutional: No fevers, chills, or sweats, no generalized fatigue, change in appetite Eyes: No visual changes, double vision, eye pain Ear, nose and throat: No hearing loss, ear pain, nasal congestion, sore throat Cardiovascular: No chest pain, palpitations Respiratory:  No shortness of breath at rest or with exertion, wheezes GastrointestinaI: No nausea, vomiting, diarrhea, abdominal pain, fecal incontinence Genitourinary:  No dysuria, urinary retention or frequency Musculoskeletal:  No neck pain, back pain Integumentary: No rash, pruritus, skin lesions Neurological: as above Psychiatric: No depression, insomnia, anxiety Endocrine: No palpitations, fatigue, diaphoresis, mood swings, change in appetite, change in weight, increased thirst Hematologic/Lymphatic:  No purpura, petechiae. Allergic/Immunologic: no itchy/runny eyes, nasal congestion, recent allergic reactions, rashes  PHYSICAL EXAM: Blood pressure 124/83, pulse 73, temperature 99.1 F (37.3 C), temperature source Oral, height 5\' 9"  (1.753 m), weight 158 lb (71.7 kg), SpO2 100 %. General: No acute distress.  Patient appears well-groomed.   Head:  Normocephalic/atraumatic Eyes:  Fundi examined but not visualized Neck: supple, no paraspinal tenderness, full range of motion Heart:  Regular rate and rhythm Lungs:  Clear to auscultation bilaterally Back: No paraspinal tenderness Neurological Exam: alert and oriented to person, place, and time. Attention span and concentration intact, recent memory fair and remote memory intact, fund of knowledge intact.  Speech fluent and not dysarthric, language intact.   Montreal Cognitive Assessment  06/02/2019 04/16/2016    Visuospatial/ Executive (0/5) 4 5  Naming (0/3) 3 3  Attention: Read list of digits (0/2) 2 2  Attention: Read list of letters (0/1) 1 1  Attention: Serial 7 subtraction starting at 100 (0/3) 1 1  Language: Repeat phrase (0/2) 0 2  Language : Fluency (0/1) 1 1  Abstraction (0/2) 2 2  Delayed Recall (0/5) 2 3  Orientation (0/6) 5 6  Total 21 26  Adjusted Score (based on education) - 26   Notes slight binocular diplopia on primary gaze.  Decreased hearing on left.  Decreased facial sensation on left.  Otherwise, CN II-XII intact. Bulk and tone normal, muscle strength 5/5 throughout.  Sensation to light touch, temperature and vibration intact.  Deep tendon reflexes 2+ throughout, toes downgoing.  Finger to nose and heel to shin testing intact.  Gait normal, Romberg negative.  IMPRESSION: 1. New postconcussion syndrome 2.  Persistent postconcussion syndrome 3.  Diplopia  PLAN: 1.  MRI of brain 2.  Refer to concussion clinic 3.  After therapy in concussion clinic, discuss trying a different therapist for CBT. 4.  Advised that she should no longer ride horses 5.  Follow up in 3 months.  25 minutes spent face to face with patient, over 50% spent discussing diagnosis and plan.  Metta Clines, DO  CC:  Unk Pinto, MD

## 2019-06-02 ENCOUNTER — Encounter: Payer: Self-pay | Admitting: Neurology

## 2019-06-02 ENCOUNTER — Other Ambulatory Visit: Payer: Self-pay

## 2019-06-02 ENCOUNTER — Other Ambulatory Visit: Payer: Self-pay | Admitting: Internal Medicine

## 2019-06-02 ENCOUNTER — Ambulatory Visit: Payer: 59 | Admitting: Neurology

## 2019-06-02 VITALS — BP 124/83 | HR 73 | Temp 99.1°F | Ht 69.0 in | Wt 158.0 lb

## 2019-06-02 DIAGNOSIS — H532 Diplopia: Secondary | ICD-10-CM

## 2019-06-02 DIAGNOSIS — F0781 Postconcussional syndrome: Secondary | ICD-10-CM

## 2019-06-02 DIAGNOSIS — J4520 Mild intermittent asthma, uncomplicated: Secondary | ICD-10-CM

## 2019-06-02 DIAGNOSIS — S069X0S Unspecified intracranial injury without loss of consciousness, sequela: Secondary | ICD-10-CM

## 2019-06-02 NOTE — Patient Instructions (Addendum)
1.  We will get MRI of brain without contrast 2.  I will refer you to Dr. Hulan Saas at the concussion clinic  3.  I would recommend no longer riding 4.  Follow up in 3 months.  We have sent a referral to [facility] for your MRI and they will call you directly to schedule your appointment. They are located at [place]. If you need to contact them, or you have not received a call to schedule an appointment within 5-7 days, please call [phone].

## 2019-06-03 ENCOUNTER — Telehealth: Payer: Self-pay

## 2019-06-03 NOTE — Telephone Encounter (Signed)
Patient sustained a concussion on 05/16/2019. Ref. By Dr. Tomi Likens. Injury with her horse where she was knocked on her back and hit the back of her head.   Symptoms at the time: head felt full, double vision, some loss of balance, sleepy, sore head No loss of consciousness MRI has been ordered but patient has not set up an appointment.  Previous concussion 6 years ago and claims to have symptoms from that time (anxiety and depression) PVD/ legally blind  History of migraines

## 2019-06-03 NOTE — Telephone Encounter (Signed)
Called patient to set up concussion visit. Left a message to call back.

## 2019-06-06 NOTE — Progress Notes (Signed)
Subjective:     Chief Complaint: Nicole Waller, DOB: 20-Jan-1966, is a 53 y.o. female who presents for concussion like symptoms. States her symptoms are similar to what she experienced 6 years ago. Migraines her entire life. No neck pain noted. Glut pain due to falling off her horse. History of concussions. Patient is frustrated with her plan of care dating back to about 2016 when she sustained her first concussion. States she doesn't understand why she wasn't referred to Dr. Tamala Julian sooner rather than seeing Dr. Tomi Likens for concussion. States that she did not receive proper treatment and had to research "brain healing through nutrition" on her own. States she hasn't been the same since then.  Patient has not been able to ride horses.  Patient has been seen the neurologist and is scheduled to have an MRI in the near future.  Patient feels like she is herself except for the cognitive difficulty with him concentration as well as the memory more than anything else.  Does have some anxiety noted as well.  Injury date : 05/16/2019 Visit #: 1    History of Present Illness:    Concussion Self-Reported Symptom Score Symptoms rated on a scale 1-6, in last 24 hours  Headache: 0  Nausea: 0  Vomiting: 0  Balance Difficulty: 0   Dizziness: 0  Fatigue: 0  Trouble Falling Asleep: 0  Sleep More Than Usual: 0  Sleep Less Than Usual: 0  Daytime Drowsiness: 0  Photophobia: 0  Phonophobia: 3 (background noise since about 6 years ago)     Review of Systems:  No , visual changes, nausea, vomiting, diarrhea, constipation, dizziness, abdominal pain, skin rash, fevers, chills, night sweats, weight loss, swollen lymph nodes, body aches, joint swelling, muscle aches, chest pain, shortness of breath, mood changes.   +Headache   Review of History: Past Medical History:  Past Medical History:  Diagnosis Date  . Abdominal pain    in past  . Abnormal chest CT    in past  . Abnormal EKG   . Anxiety    since  head injury  . Chronic cough   . Chronic headache   . Concussion    past injury from horse  . Dyspnea   . Exercise-induced asthma   . Head injury 04/2013   due to horse injury/ hit backwards by horse head!  . History of nephrolithiasis   . Hx of migraines   . Post-operative nausea and vomiting   . Pulmonary nodule 2011  . Rosacea     Past Surgical History:  has a past surgical history that includes Shoulder surgery (Bilateral); Foot surgery (Left, 1991); and Elbow surgery (Right). Family History: family history includes Breast cancer in her maternal grandmother; Cancer (age of onset: 93) in her maternal grandmother; Colitis in her father; Colon polyps in her father and mother; Diabetes in her mother and paternal grandfather; Heart disease in her father; Hyperlipidemia in her father and sister; Hypertension in her father and sister; Lupus in her mother; Rheum arthritis in her mother. no family history of autoimmune Social History:  reports that she has never smoked. She has never used smokeless tobacco. She reports that she does not drink alcohol or use drugs. Current Medications: has a current medication list which includes the following prescription(s): albuterol, amphetamine er, aspirin, b complex-b12, calcium, cetirizine, cholecalciferol, coq10, imitrex, magnesium, meloxicam, methocarbamol, metronidazole, montelukast, multivitamin, NON FORMULARY, norgestrel-ethinyl estradiol, fish oil, qvar redihaler, sumatriptan, and valacyclovir. Allergies: is allergic to accolate [zafirlukast]; codeine; latex;  lyrica [pregabalin]; oxycodone; probiotic [acidophilus]; and tape.  Objective:    Physical Examination Vitals:   06/07/19 1224  BP: 110/70  Pulse: 84  SpO2: 98%   General: No apparent distress alert and oriented x3 mood and affect normal, dressed appropriately.  HEENT: Pupils equal, extraocular movements intact no sign of any nystagmus Respiratory: Patient's speak in full sentences and  does not appear short of breath  Cardiovascular: No lower extremity edema, non tender, no erythema  Skin: Warm dry intact with no signs of infection or rash on extremities or on axial skeleton.  Abdomen: Soft nontender  Neuro: Cranial nerves II through XII are intact, neurovascularly intact in all extremities with 2+ DTRs and 2+ pulses.  Lymph: No lymphadenopathy of posterior or anterior cervical chain or axillae bilaterally.  Gait normal with good balance and coordination.  MSK:  Non tender with full range of motion and good stability and symmetric strength and tone of shoulders, elbows, wrist,  knee and ankles bilaterally.  Psychiatric: Oriented X3, intact recent and remote memory, judgement and insight, mood slightly anxious affect mildly blunted patient is minorly standoffish  Concussion testing performed today:   Patient did do very well with serial sevens, Mini-Mental status exam did very well with 29 out of 30.  Patient was balance scores also 28 out of 30.  After Visit Summary printed out and provided to patient as appropriate.

## 2019-06-07 ENCOUNTER — Encounter: Payer: Self-pay | Admitting: Family Medicine

## 2019-06-07 ENCOUNTER — Ambulatory Visit: Payer: 59 | Admitting: Family Medicine

## 2019-06-07 ENCOUNTER — Other Ambulatory Visit: Payer: Self-pay

## 2019-06-07 ENCOUNTER — Telehealth: Payer: Self-pay | Admitting: Neurology

## 2019-06-07 DIAGNOSIS — F419 Anxiety disorder, unspecified: Secondary | ICD-10-CM

## 2019-06-07 NOTE — Telephone Encounter (Signed)
Patient is calling in about MRI./ She said her sister works at [employer] and they have an Psychiatric nurse. She can be seen by them on Friday instead of waiting until 06/23/19 for [facility]. She wants orders set there. They need Signed MRI order fax: ATTN: MRI SCHEDULING [phone].

## 2019-06-07 NOTE — Telephone Encounter (Signed)
Called and LMOVM advising Pt Dr. Tomi Likens will be in the office tomorrow.

## 2019-06-07 NOTE — Patient Instructions (Signed)
Good to meet you  I really think the brain is going to be good.  COnitnue the fish oil and the vitamin D  Add B6 100mg  to the B12  DHEA 50 mg daily to help regulate hormones for next 4 weeks Choline 500mg  daily will help with cognitive aspect  When you get the MRI then send me the CD and I will look at it  See em agai nin 3-4 weeks to see how you are doing

## 2019-06-07 NOTE — Assessment & Plan Note (Signed)
I do believe the patient did have a history of a traumatic brain injury and likely contributed to some of the difficulty patient had initially.  Patient is scheduled for an MRI and we will further evaluate to see if there is any white matter changes that could be consistent with a traumatic brain injury or more with patient's chronic migraines.  I do believe that there is an underlying anxiety that is likely exacerbating some of the symptoms.  We discussed which medications are with mild in which ones could be affecting some of her concentrations.  We discussed with her about activity and exercise prescription which will be good.  Patient was doing much better it sounds like when she was doing her yoga and going to the gym regularly.  Encouraged her to try to consider to find a home regimen that would be beneficial.  Discussed icing regimen.  Discussed which activities of doing which wants to avoid.  Follow-up with me again in 4 to 8 weeks

## 2019-06-09 NOTE — Telephone Encounter (Signed)
Called and advised Pt, per Dr. Tomi Likens, it is preferable to have the imaging performed at [facility] where he is able to go online and see the actual images and not have to request a CD.  Pt will keep 06/23/19 appt

## 2019-06-10 ENCOUNTER — Telehealth: Payer: Self-pay | Admitting: Neurology

## 2019-06-10 NOTE — Telephone Encounter (Signed)
Spoke with [employer]. I advised her it has been decided to keep appt with [facility].

## 2019-06-10 NOTE — Telephone Encounter (Signed)
Left message with after hour service on 06-10-19 @ 11:55 am    Caller Ciecie F from [facility] is needing an order and auth for a mutal patient   MRI order and auth of the brain

## 2019-06-19 ENCOUNTER — Other Ambulatory Visit: Payer: 59 | Admitting: Internal Medicine

## 2019-06-19 ENCOUNTER — Other Ambulatory Visit: Payer: Self-pay | Admitting: Internal Medicine

## 2019-06-19 DIAGNOSIS — J4520 Mild intermittent asthma, uncomplicated: Secondary | ICD-10-CM | POA: Diagnosis not present

## 2019-06-19 DIAGNOSIS — J0140 Acute pansinusitis, unspecified: Secondary | ICD-10-CM

## 2019-06-19 MED ORDER — ALBUTEROL SULFATE HFA 108 (90 BASE) MCG/ACT IN AERS: INHALATION_SPRAY | RESPIRATORY_TRACT | 3 refills | Status: DC

## 2019-06-19 MED ORDER — FLUCONAZOLE 150 MG PO TABS: ORAL_TABLET | ORAL | 1 refills | Status: DC

## 2019-06-19 MED ORDER — AZITHROMYCIN 250 MG PO TABS: ORAL_TABLET | ORAL | 1 refills | Status: DC

## 2019-06-19 NOTE — Progress Notes (Addendum)
THIS ENCOUNTER IS A VIRTUAL VISIT DUE TO COVID-19 - PATIENT WAS NOT SEEN IN THE OFFICE.  PATIENT HAS CONSENTED TO VIRTUAL VISIT / TELEMEDICINE VISIT  Virtual Visit via telephone Note  I connected with  Nicole Waller   on 06/19/2019 06/19/2019  by telephone.  I verified that I am speaking with the correct person using two identifiers.    I discussed the limitations of evaluation and management by telemedicine and the availability of in person appointments. The patient expressed understanding and agreed to proceed.  History of Present Illness:    Patient is a nice 53 yo MWF presenting with c/o 2-3 day hx/o increasing sinus pressure congestion "around 'her' eyes". Having chills & sweats. Also hang some sx's of her asthma flaring up.      She does not symptoms concerning for COVID-19 infection such as CP,  cough, loss of smell or taste or new SHORTNESS OF BREATH that suggest any further testing/ screening at this time.  Social distancing reinforced today.  Medications  .  norgestrel-ethinyl estradiol (CRYSELLE-28) 0.3-30 MG-MCG tablet, Take 1 tablet by mouth daily. Marland Kitchen  albuterol (PROAIR HFA) 108 (90 Base) MCG/ACT inhaler, 2 puffs every 4 hours as needed only  if your can't catch your breath .  cetirizine (ZYRTEC) 10 MG tablet, Take 10 mg by mouth daily. .  montelukast (SINGULAIR) 10 MG tablet, Take 1 tablet (10 mg total) by mouth daily. Marland Kitchen  QVAR REDIHALER 80 MCG/ACT inhaler, TAKE 2 PUFFS 1 TO 2 TIMES A DAY FOR ASTHMA PROPHYLAXIS. Marland Kitchen  aspirin 81 MG tablet, Take 81 mg by mouth. Take one pill every 4 days .  IMITREX 100 MG tablet, Take 1 tablet at onset of Migraine and may repeat 1 x in 2 hours (Max 2 tablets /24 hours) .  meloxicam (MOBIC) 15 MG tablet, Take one daily with food for 2 weeks, can take with tylenol, can not take with aleve, iburpofen, then as needed daily for pain .  SUMAtriptan (IMITREX) 20 MG/ACT nasal spray, May repeat in 2 hours if headache persists or recurs. .  Amphetamine ER  (ADZENYS XR-ODT) 9.4 MG TBED, Take 1 tablet by mouth daily. .  B Complex Vitamins (B COMPLEX-B12) TABS, Take 1 tablet by mouth daily. .  Calcium 600-200 MG-UNIT per tablet, Take 1 tablet by mouth every other day. Every three days .  cholecalciferol (VITAMIN D) 1000 UNITS tablet, 2,000 Units. 1 tablet daily .  Coenzyme Q10 (COQ10) 100 MG CAPS, Take by mouth daily. Marland Kitchen  MAGNESIUM GLYCINATE PLUS PO, Take 400 mg by mouth 2 (two) times daily. .  methocarbamol (ROBAXIN) 500 MG tablet, Take 1 tablet (500 mg total) by mouth 2 (two) times daily as needed for muscle spasms. .  metronidazole (NORITATE) 1 % cream, Apply topically daily. .  Multiple Vitamin (MULTIVITAMIN) capsule, Take 1 capsule by mouth daily.   .  NON FORMULARY, Metro lotion prn for rosecea .  Omega-3 Fatty Acids (FISH OIL) 1000 MG CAPS, Take 1,000 mg by mouth daily. .  valACYclovir (VALTREX) 500 MG tablet, Take 500 mg by mouth daily as needed (for outbreak).   Problem list She has PULMONARY NODULE; EPIGASTRIC PAIN; ABNORMAL EKG; Cough variant asthma; Anxiety; Hx of migraines; Rosacea; History of nephrolithiasis; Vitamin D deficiency; Medication management; CKD Stage 2 (GFR 72 ml/min); Allergic rhinitis; TBI (traumatic brain injury) ([facility]); Elevated BP, Screening; Elevated cholesterol, screening; Other abnormal glucose, screening; Allergic asthma; and Benign neoplasm of descending colon on their problem list.   Observations/Objective:  General : Well sounding patient in no apparent distress HEENT: no hoarseness, no cough for duration of visit. Nasal speech Lungs: speaks in complete sentences, no audible wheezing, no apparent distress Neurological: alert, oriented x 3 Psychiatric: pleasant, judgement appropriate   Assessment and Plan:  1. Mild intermittent extrinsic asthma without complication  - albuterol (PROAIR HFA) 108 (90 Base) MCG/ACT inhaler; Use 2 inhalations 15 minutes apart every 4 hours to Rescue Asthma  Dispense: 48 g;  Refill: 3  2. Acute pansinusitis, recurrence not specified  - azithromycin (ZITHROMAX) 250 MG tablet; Take 2 tablets (500 mg) on  Day 1,  followed by 1 tablet (250 mg) once daily on Days 2 through 5.  Dispense: 6 each; Refill: 1  - Has Rx Prednisone to start  Follow Up Instructions:  Recommend steam   I discussed the assessment and treatment plan with the patient. The patient was provided an opportunity to ask questions and all were answered. The patient agreed with the plan and demonstrated an understanding of the instructions.   The patient was advised to call back or seek an in-person evaluation if the symptoms worsen or if the condition fails to improve as anticipated.  I provided 18 minutes of non-face-to-face time during this encounter for chart review, virtual exam, evaluation and documentation.  Kirtland Bouchard, MD

## 2019-06-22 ENCOUNTER — Encounter: Payer: Self-pay | Admitting: Internal Medicine

## 2019-06-22 NOTE — Progress Notes (Signed)
Annual Screening/Preventative Visit & Comprehensive Evaluation &  Examination     This very nice 53 y.o. MWF who presents for a Screening /Preventative Visit & comprehensive evaluation and management of multiple medical co-morbidities.  Patient has been followed expectantly for labile elevated BP, HLD, Prediabetes  and Vitamin D Deficiency. 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     In June 2014, patient experienced a concussive TBI Head injury & is followed by Dr Tomi Likens. Recently on July 12, she fell off of a horse & had a 2sd concussion and per Dr Tomi Likens had a Brain MRI this morning.       Patient is followed expectantly for elevated BP. Patient's BP has been controlled at home and patient denies any cardiac symptoms as chest pain, palpitations, shortness of breath, dizziness or ankle swelling. Today's BP is borderline elevated at  122/88.      Patient's lipids are controlled with diet. Last lipids were at goal: Lab Results  Component Value Date   CHOL 189 05/26/2018   HDL 74 05/26/2018   LDLCALC 98 05/26/2018   TRIG 81 05/26/2018   CHOLHDL 2.6 05/26/2018      Patient is also monitored for glucose intolerance and patient denies reactive hypoglycemic symptoms, visual blurring, diabetic polys or paresthesias. Last A1c was Normal & at goal: Lab Results  Component Value Date   HGBA1C 5.1 05/26/2018      Finally, patient has history of Vitamin D Deficiency and last Vitamin D was at goal: Lab Results  Component Value Date   VD25OH 86 05/26/2018   Current Outpatient Medications on File Prior to Visit  Medication Sig  . albuterol (PROAIR HFA) 108 (90 Base) MCG/ACT inhaler Use 2 inhalations 15 minutes apart every 4 hours to Rescue Asthma  . Amphetamine ER (ADZENYS XR-ODT) 9.4 MG TBED Take 1 tablet by mouth daily.  Marland Kitchen aspirin 81 MG tablet Take 81 mg by mouth. Take one pill every 4 days  . azithromycin (ZITHROMAX) 250 MG tablet Take 2 tablets (500 mg) on  Day 1,  followed by 1  tablet (250 mg) once daily on Days 2 through 5.  . B Complex Vitamins (B COMPLEX-B12) TABS Take 1 tablet by mouth daily.  . Calcium 600-200 MG-UNIT per tablet Take 1 tablet by mouth every other day. Every three days  . cetirizine (ZYRTEC) 10 MG tablet Take 10 mg by mouth daily.  . Cholecalciferol (VITAMIN D) 125 MCG (5000 UT) CAPS Take 1 capsule by mouth daily.  . CHOLINE PO Take 1 tablet by mouth daily.  . Coenzyme Q10 (COQ10) 100 MG CAPS Take by mouth daily.  . fluconazole (DIFLUCAN) 150 MG tablet Take 1 tablet 2 x /week  as needed for yeast infection  . IMITREX 100 MG tablet Take 1 tablet at onset of Migraine and may repeat 1 x in 2 hours (Max 2 tablets /24 hours)  . MAGNESIUM GLYCINATE PLUS PO Take 400 mg by mouth 2 (two) times daily.  . meloxicam (MOBIC) 15 MG tablet Take one daily with food for 2 weeks, can take with tylenol, can not take with aleve, iburpofen, then as needed daily for pain  . methocarbamol (ROBAXIN) 500 MG tablet Take 1 tablet (500 mg total) by mouth 2 (two) times daily as needed for muscle spasms.  . metronidazole (NORITATE) 1 % cream Apply topically daily.  . montelukast (SINGULAIR) 10 MG tablet Take 1 tablet (10 mg total) by mouth daily.  . Multiple Vitamin (MULTIVITAMIN) capsule  Take 1 capsule by mouth daily.    . NON FORMULARY Metro lotion prn for rosecea  . norgestrel-ethinyl estradiol (CRYSELLE-28) 0.3-30 MG-MCG tablet Take 1 tablet by mouth daily.  . Nutritional Supplements (DHEA PO) Take 1 tablet by mouth daily.  . Omega-3 Fatty Acids (FISH OIL) 1000 MG CAPS Take 1,000 mg by mouth daily.  . Pyridoxine HCl (VITAMIN B-6 PO) Take 1 tablet by mouth daily.  Marland Kitchen QVAR REDIHALER 80 MCG/ACT inhaler TAKE 2 PUFFS 1 TO 2 TIMES A DAY FOR ASTHMA PROPHYLAXIS.  Marland Kitchen SUMAtriptan (IMITREX) 20 MG/ACT nasal spray May repeat in 2 hours if headache persists or recurs.  . valACYclovir (VALTREX) 500 MG tablet Take 500 mg by mouth daily as needed (for outbreak).   . zinc gluconate 50 MG  tablet Take 50 mg by mouth daily.   No current facility-administered medications on file prior to visit.    Allergies  Allergen Reactions  . Accolate [Zafirlukast]     GI Upset  . Codeine     REACTION: nausea and vomiting  . Latex Rash  . Lyrica [Pregabalin]     Fatigue  . Oxycodone     nauseated  . Probiotic [Acidophilus]     Headache  . Tape     Causes redness, and skin gets raw!   Past Medical History:  Diagnosis Date  . Abdominal pain    in past  . Abnormal chest CT    in past  . Abnormal EKG   . Anxiety    since head injury  . Chronic cough   . Chronic headache   . Concussion    past injury from horse  . Dyspnea   . Exercise-induced asthma   . Head injury 04/2013   due to horse injury/ hit backwards by horse head!  . History of nephrolithiasis   . Hx of migraines   . Post-operative nausea and vomiting   . Pulmonary nodule 2011  . Rosacea    Health Maintenance  Topic Date Due  . HIV Screening  10/21/1981  . PAP SMEAR-Modifier  10/22/1987  . MAMMOGRAM  10/21/2016  . INFLUENZA VACCINE  06/12/2019  . TETANUS/TDAP  05/13/2027  . COLONOSCOPY  07/09/2027   Immunization History  Administered Date(s) Administered  . Influenza Split 08/11/2014, 08/19/2017  . Influenza Whole 08/11/2012  . PPD Test 04/16/2016, 05/12/2017, 05/26/2018  . Pneumococcal Conjugate-13 10/09/2017  . Pneumococcal Polysaccharide-23 12/04/2017  . Pneumococcal-Unspecified 11/11/2006  . Td 11/11/2006  . Tdap 05/12/2017   Last Colon - 07/08/2017 - Dr Henrene Pastor - Recc 10 yr f/u - due Sept 2028  Last MGM - 06/23/2018 at Dr Radene Knee- has f/u app't 07/2019  Past Surgical History:  Procedure Laterality Date  . ELBOW SURGERY Right    2012/ tendon and reattachment left arm in 2018  . FOOT SURGERY Left 1991   bunionectomy  . SHOULDER SURGERY Bilateral    right-2003/left 2005 due to bone spurs   Family History  Problem Relation Age of Onset  . Colon polyps Father   . Colitis Father   .  Heart disease Father   . Hypertension Father   . Hyperlipidemia Father   . Breast cancer Maternal Grandmother   . Cancer Maternal Grandmother 12       Breast,uterine,ovarian  . Colon polyps Mother   . Diabetes Mother   . Rheum arthritis Mother   . Lupus Mother   . Diabetes Paternal Grandfather   . Hypertension Sister   . Hyperlipidemia Sister  Social History   Tobacco Use  . Smoking status: Never Smoker  . Smokeless tobacco: Never Used  Substance Use Topics  . Alcohol use: No  . Drug use: No    ROS Constitutional: Denies fever, chills, weight loss/gain, headaches, insomnia,  night sweats, and change in appetite. Does c/o fatigue. Eyes: Denies redness, blurred vision, diplopia, discharge, itchy, watery eyes.  ENT: Denies discharge, congestion, post nasal drip, epistaxis, sore throat, earache, hearing loss, dental pain, Tinnitus, Vertigo, Sinus pain, snoring.  Cardio: Denies chest pain, palpitations, irregular heartbeat, syncope, dyspnea, diaphoresis, orthopnea, PND, claudication, edema Respiratory: denies cough, dyspnea, DOE, pleurisy, hoarseness, laryngitis, wheezing.  Gastrointestinal: Denies dysphagia, heartburn, reflux, water brash, pain, cramps, nausea, vomiting, bloating, diarrhea, constipation, hematemesis, melena, hematochezia, jaundice, hemorrhoids Genitourinary: Denies dysuria, frequency, urgency, nocturia, hesitancy, discharge, hematuria, flank pain Breast: Breast lumps, nipple discharge, bleeding.  Musculoskeletal: Denies arthralgia, myalgia, stiffness, Jt. Swelling, pain, limp, and strain/sprain. Denies falls. Skin: Denies puritis, rash, hives, warts, acne, eczema, changing in skin lesion Neuro: No weakness, tremor, incoordination, spasms, paresthesia, pain Psychiatric: Denies confusion, memory loss, sensory loss. Denies Depression. Endocrine: Denies change in weight, skin, hair change, nocturia, and paresthesia, diabetic polys, visual blurring, hyper / hypo glycemic  episodes.  Heme/Lymph: No excessive bleeding, bruising, enlarged lymph nodes.  Physical Exam  BP 122/88   Pulse 68   Temp 97.9 F (36.6 C)   Resp 16   Ht 5\' 9"  (1.753 m)   Wt 160 lb 9.6 oz (72.8 kg)   BMI 23.72 kg/m   General Appearance: Well nourished, well groomed and in no apparent distress.  Eyes: PERRLA, EOMs, conjunctiva no swelling or erythema, normal fundi and vessels. Sinuses: No frontal/maxillary tenderness ENT/Mouth: EACs patent / TMs  nl. Nares clear without erythema, swelling, mucoid exudates. Oral hygiene is good. No erythema, swelling, or exudate. Tongue normal, non-obstructing. Tonsils not swollen or erythematous. Hearing normal.  Neck: Supple, thyroid not palpable. No bruits, nodes or JVD. Respiratory: Respiratory effort normal.  BS equal and clear bilateral without rales, rhonci, wheezing or stridor. Cardio: Heart sounds are normal with regular rate and rhythm and no murmurs, rubs or gallops. Peripheral pulses are normal and equal bilaterally without edema. No aortic or femoral bruits. Chest: symmetric with normal excursions and percussion. Breasts: Deferred to GAYN Abdomen: Flat, soft with bowel sounds active. Nontender, no guarding, rebound, hernias, masses, or organomegaly.  Lymphatics: Non tender without lymphadenopathy.  Musculoskeletal: Full ROM all peripheral extremities, joint stability, 5/5 strength, and normal gait. Skin: Warm and dry without rashes, lesions, cyanosis, clubbing or  ecchymosis.  Neuro: Cranial nerves intact, reflexes equal bilaterally. Normal muscle tone, no cerebellar symptoms. Sensation intact.  Pysch: Alert and oriented X 3, normal affect, Insight and Judgment appropriate.   Assessment and Plan  1. Annual Preventative Screening Examination  2. Elevated BP without diagnosis of hypertension  - EKG 12-Lead - [place], RETROPERITNL ABD,  LTD - Urinalysis, Routine w reflex microscopic - Microalbumin / creatinine urine ratio - CBC with  Differential/Platelet - COMPLETE METABOLIC PANEL WITH GFR - Magnesium - TSH  3. Elevated cholesterol, screening  - EKG 12-Lead - [place], RETROPERITNL ABD,  LTD - Lipid panel - TSH  4. Other abnormal glucose, screening  - EKG 12-Lead - [place], RETROPERITNL ABD,  LTD - Hemoglobin A1c - Insulin, random  5. Vitamin D deficiency  - VITAMIN D 25 Hydroxyl  6. Traumatic brain injury with loss of consciousness, sequela ([place])   7. Screening for colorectal cancer  - POC Hemoccult Bld/Stl  8. Screening examination for pulmonary tuberculosis  - TB Skin Test  9. Screening for ischemic heart disease  - EKG 12-Lead  10. FHx: heart disease  - EKG 12-Lead - [place], RETROPERITNL ABD,  LTD  11. Abnormal EKG  - EKG 12-Lead  12. Screening for AAA (aortic abdominal aneurysm)  - [place], RETROPERITNL ABD,  LTD  13. Fatigue, unspecified type - Iron,Total/Total Iron Binding Cap - Vitamin B12 - CBC with Differential/Platelet - TSH  14. Medication management  - Urinalysis, Routine w reflex microscopic - Microalbumin / creatinine urine ratio - CBC with Differential/Platelet - COMPLETE METABOLIC PANEL WITH GFR - Magnesium - Lipid panel - TSH - Hemoglobin A1c - Insulin, random - VITAMIN D 25 Hydroxyl  15. Nonspecific abnormal electrocardiogram (ECG) (EKG)        Patient was counseled in prudent diet to achieve/maintain BMI less than 25 for weight control, BP monitoring, regular exercise and medications. Discussed med's effects and SE's. Screening labs and tests as requested with regular follow-up as recommended. Over 40 minutes of exam, counseling, chart review and high complex critical decision making was performed.   Kirtland Bouchard, MD

## 2019-06-22 NOTE — Patient Instructions (Signed)

## 2019-06-23 ENCOUNTER — Ambulatory Visit
Admission: RE | Admit: 2019-06-23 | Discharge: 2019-06-23 | Disposition: A | Discharge status: Home / Self Care | Payer: 59 | Source: Ambulatory Visit | Attending: Neurology | Admitting: Neurology

## 2019-06-23 ENCOUNTER — Encounter: Payer: Self-pay | Admitting: Family Medicine

## 2019-06-23 ENCOUNTER — Ambulatory Visit (INDEPENDENT_AMBULATORY_CARE_PROVIDER_SITE_OTHER): Payer: 59 | Admitting: Internal Medicine

## 2019-06-23 ENCOUNTER — Other Ambulatory Visit: Payer: Self-pay

## 2019-06-23 VITALS — BP 122/88 | HR 68 | Temp 97.9°F | Resp 16 | Ht 69.0 in | Wt 160.6 lb

## 2019-06-23 DIAGNOSIS — R9431 Abnormal electrocardiogram [ECG] [EKG]: Secondary | ICD-10-CM

## 2019-06-23 DIAGNOSIS — F0781 Postconcussional syndrome: Secondary | ICD-10-CM

## 2019-06-23 DIAGNOSIS — E559 Vitamin D deficiency, unspecified: Secondary | ICD-10-CM

## 2019-06-23 DIAGNOSIS — Z8249 Family history of ischemic heart disease and other diseases of the circulatory system: Secondary | ICD-10-CM | POA: Diagnosis not present

## 2019-06-23 DIAGNOSIS — E7800 Pure hypercholesterolemia, unspecified: Secondary | ICD-10-CM

## 2019-06-23 DIAGNOSIS — H532 Diplopia: Secondary | ICD-10-CM

## 2019-06-23 DIAGNOSIS — Z136 Encounter for screening for cardiovascular disorders: Secondary | ICD-10-CM

## 2019-06-23 DIAGNOSIS — Z79899 Other long term (current) drug therapy: Secondary | ICD-10-CM

## 2019-06-23 DIAGNOSIS — Z1211 Encounter for screening for malignant neoplasm of colon: Secondary | ICD-10-CM

## 2019-06-23 DIAGNOSIS — S069X9S Unspecified intracranial injury with loss of consciousness of unspecified duration, sequela: Secondary | ICD-10-CM

## 2019-06-23 DIAGNOSIS — R7309 Other abnormal glucose: Secondary | ICD-10-CM

## 2019-06-23 DIAGNOSIS — R5383 Other fatigue: Secondary | ICD-10-CM

## 2019-06-23 DIAGNOSIS — Z0001 Encounter for general adult medical examination with abnormal findings: Secondary | ICD-10-CM

## 2019-06-23 DIAGNOSIS — R030 Elevated blood-pressure reading, without diagnosis of hypertension: Secondary | ICD-10-CM | POA: Diagnosis not present

## 2019-06-23 DIAGNOSIS — Z0000 Encounter for general adult medical examination without abnormal findings: Secondary | ICD-10-CM

## 2019-06-23 DIAGNOSIS — S069X0S Unspecified intracranial injury without loss of consciousness, sequela: Secondary | ICD-10-CM

## 2019-06-23 DIAGNOSIS — Z111 Encounter for screening for respiratory tuberculosis: Secondary | ICD-10-CM | POA: Diagnosis not present

## 2019-06-24 LAB — COMPLETE METABOLIC PANEL WITH GFR
AG Ratio: 1.6 (calc) (ref 1.0–2.5)
ALT: 16 U/L (ref 6–29)
AST: 14 U/L (ref 10–35)
Albumin: 4.3 g/dL (ref 3.6–5.1)
Alkaline phosphatase (APISO): 77 U/L (ref 37–153)
BUN: 16 mg/dL (ref 7–25)
CO2: 26 mmol/L (ref 20–32)
Calcium: 9.7 mg/dL (ref 8.6–10.4)
Chloride: 102 mmol/L (ref 98–110)
Creat: 0.88 mg/dL (ref 0.50–1.05)
GFR, Est African American: 88 mL/min/{1.73_m2} (ref >=60)
GFR, Est Non African American: 76 mL/min/{1.73_m2} (ref >=60)
Globulin: 2.7 g/dL (calc) (ref 1.9–3.7)
Glucose, Bld: 158 mg/dL — ABNORMAL HIGH (ref 65–99)
Potassium: 4 mmol/L (ref 3.5–5.3)
Sodium: 137 mmol/L (ref 135–146)
Total Bilirubin: 0.3 mg/dL (ref 0.2–1.2)
Total Protein: 7 g/dL (ref 6.1–8.1)

## 2019-06-24 LAB — URINALYSIS, ROUTINE W REFLEX MICROSCOPIC
Bilirubin Urine: NEGATIVE
Glucose, UA: NEGATIVE
Hgb urine dipstick: NEGATIVE
Ketones, ur: NEGATIVE
Leukocytes,Ua: NEGATIVE
Nitrite: NEGATIVE
Protein, ur: NEGATIVE
Specific Gravity, Urine: 1.006 (ref 1.001–1.03)
pH: 6.5 (ref 5.0–8.0)

## 2019-06-24 LAB — CBC WITH DIFFERENTIAL/PLATELET
Absolute Monocytes: 379 cells/uL (ref 200–950)
Basophils Absolute: 87 cells/uL (ref 0–200)
Basophils Relative: 1.1 %
Eosinophils Absolute: 47 cells/uL (ref 15–500)
Eosinophils Relative: 0.6 %
HCT: 41.3 % (ref 35.0–45.0)
Hemoglobin: 13.9 g/dL (ref 11.7–15.5)
Lymphs Abs: 1446 cells/uL (ref 850–3900)
MCH: 34.1 pg — ABNORMAL HIGH (ref 27.0–33.0)
MCHC: 33.7 g/dL (ref 32.0–36.0)
MCV: 101.2 fL — ABNORMAL HIGH (ref 80.0–100.0)
MPV: 10 fL (ref 7.5–12.5)
Monocytes Relative: 4.8 %
Neutro Abs: 5941 cells/uL (ref 1500–7800)
Neutrophils Relative %: 75.2 %
Platelets: 359 10*3/uL (ref 140–400)
RBC: 4.08 10*6/uL (ref 3.80–5.10)
RDW: 12 % (ref 11.0–15.0)
Total Lymphocyte: 18.3 %
WBC: 7.9 10*3/uL (ref 3.8–10.8)

## 2019-06-24 LAB — TSH: TSH: 3.09 mIU/L

## 2019-06-24 LAB — MICROALBUMIN / CREATININE URINE RATIO
Creatinine, Urine: 21 mg/dL (ref 20–275)
Microalb, Ur: <0.2 mg/dL

## 2019-06-24 LAB — LIPID PANEL
Cholesterol: 206 mg/dL — ABNORMAL HIGH (ref <200)
HDL: 82 mg/dL (ref >=50)
LDL Cholesterol (Calc): 103 mg/dL (calc) — ABNORMAL HIGH
Non-HDL Cholesterol (Calc): 124 mg/dL (calc) (ref <130)
Total CHOL/HDL Ratio: 2.5 (calc) (ref <5.0)
Triglycerides: 111 mg/dL (ref <150)

## 2019-06-24 LAB — IRON, TOTAL/TOTAL IRON BINDING CAP
%SAT: 31 % (calc) (ref 16–45)
Iron: 115 ug/dL (ref 45–160)
TIBC: 374 mcg/dL (calc) (ref 250–450)

## 2019-06-24 LAB — HEMOGLOBIN A1C
Hgb A1c MFr Bld: 5.4 % of total Hgb (ref <5.7)
Mean Plasma Glucose: 108 (calc)
eAG (mmol/L): 6 (calc)

## 2019-06-24 LAB — VITAMIN B12: Vitamin B-12: 656 pg/mL (ref 200–1100)

## 2019-06-24 LAB — INSULIN, RANDOM: Insulin: 55.5 u[IU]/mL — ABNORMAL HIGH

## 2019-06-24 LAB — MAGNESIUM: Magnesium: 2 mg/dL (ref 1.5–2.5)

## 2019-06-24 LAB — VITAMIN D 25 HYDROXY (VIT D DEFICIENCY, FRACTURES): Vit D, 25-Hydroxy: 91 ng/mL (ref 30–100)

## 2019-07-07 ENCOUNTER — Ambulatory Visit (INDEPENDENT_AMBULATORY_CARE_PROVIDER_SITE_OTHER)
Admission: RE | Admit: 2019-07-07 | Discharge: 2019-07-07 | Disposition: A | Discharge status: Home / Self Care | Payer: 59 | Source: Ambulatory Visit | Attending: Family Medicine | Admitting: Family Medicine

## 2019-07-07 ENCOUNTER — Other Ambulatory Visit: Payer: Self-pay

## 2019-07-07 ENCOUNTER — Encounter: Payer: Self-pay | Admitting: Family Medicine

## 2019-07-07 ENCOUNTER — Other Ambulatory Visit (INDEPENDENT_AMBULATORY_CARE_PROVIDER_SITE_OTHER): Payer: 59

## 2019-07-07 ENCOUNTER — Ambulatory Visit: Payer: 59 | Admitting: Family Medicine

## 2019-07-07 VITALS — BP 122/70 | HR 86 | Ht 69.0 in | Wt 161.0 lb

## 2019-07-07 DIAGNOSIS — E538 Deficiency of other specified B group vitamins: Secondary | ICD-10-CM

## 2019-07-07 DIAGNOSIS — S069X0S Unspecified intracranial injury without loss of consciousness, sequela: Secondary | ICD-10-CM

## 2019-07-07 DIAGNOSIS — G2589 Other specified extrapyramidal and movement disorders: Secondary | ICD-10-CM

## 2019-07-07 DIAGNOSIS — M2550 Pain in unspecified joint: Secondary | ICD-10-CM | POA: Diagnosis not present

## 2019-07-07 DIAGNOSIS — F419 Anxiety disorder, unspecified: Secondary | ICD-10-CM

## 2019-07-07 DIAGNOSIS — M542 Cervicalgia: Secondary | ICD-10-CM

## 2019-07-07 HISTORY — DX: Other specified extrapyramidal and movement disorders: G25.89

## 2019-07-07 LAB — CBC WITH DIFFERENTIAL/PLATELET
Basophils Absolute: 0 10*3/uL (ref 0.0–0.1)
Basophils Relative: 0.6 % (ref 0.0–3.0)
Eosinophils Absolute: 0.7 10*3/uL (ref 0.0–0.7)
Eosinophils Relative: 7.9 % — ABNORMAL HIGH (ref 0.0–5.0)
HCT: 39.3 % (ref 36.0–46.0)
Hemoglobin: 13.2 g/dL (ref 12.0–15.0)
Lymphocytes Relative: 24.2 % (ref 12.0–46.0)
Lymphs Abs: 2.1 10*3/uL (ref 0.7–4.0)
MCHC: 33.4 g/dL (ref 30.0–36.0)
MCV: 102.4 fl — ABNORMAL HIGH (ref 78.0–100.0)
Monocytes Absolute: 0.4 10*3/uL (ref 0.1–1.0)
Monocytes Relative: 4.4 % (ref 3.0–12.0)
Neutro Abs: 5.6 10*3/uL (ref 1.4–7.7)
Neutrophils Relative %: 62.9 % (ref 43.0–77.0)
Platelets: 254 10*3/uL (ref 150.0–400.0)
RBC: 3.84 Mil/uL — ABNORMAL LOW (ref 3.87–5.11)
RDW: 13.6 % (ref 11.5–15.5)
WBC: 8.8 10*3/uL (ref 4.0–10.5)

## 2019-07-07 LAB — COMPREHENSIVE METABOLIC PANEL
ALT: 19 U/L (ref 0–35)
AST: 22 U/L (ref 0–37)
Albumin: 4.1 g/dL (ref 3.5–5.2)
Alkaline Phosphatase: 75 U/L (ref 39–117)
BUN: 15 mg/dL (ref 6–23)
CO2: 22 mEq/L (ref 19–32)
Calcium: 9.5 mg/dL (ref 8.4–10.5)
Chloride: 106 mEq/L (ref 96–112)
Creatinine, Ser: 0.82 mg/dL (ref 0.40–1.20)
GFR: 73.01 mL/min (ref >60.00)
Glucose, Bld: 85 mg/dL (ref 70–99)
Potassium: 3.7 mEq/L (ref 3.5–5.1)
Sodium: 137 mEq/L (ref 135–145)
Total Bilirubin: 0.4 mg/dL (ref 0.2–1.2)
Total Protein: 7.1 g/dL (ref 6.0–8.3)

## 2019-07-07 LAB — C-REACTIVE PROTEIN: CRP: <1 mg/dL (ref 0.5–20.0)

## 2019-07-07 LAB — VITAMIN B12: Vitamin B-12: >1500 pg/mL — ABNORMAL HIGH (ref 211–911)

## 2019-07-07 LAB — IBC PANEL
Iron: 150 ug/dL — ABNORMAL HIGH (ref 42–145)
Saturation Ratios: 38 % (ref 20.0–50.0)
Transferrin: 282 mg/dL (ref 212.0–360.0)

## 2019-07-07 LAB — FERRITIN: Ferritin: 67.7 ng/mL (ref 10.0–291.0)

## 2019-07-07 LAB — TESTOSTERONE: Testosterone: 92.16 ng/dL — ABNORMAL HIGH (ref 15.00–40.00)

## 2019-07-07 LAB — SEDIMENTATION RATE: Sed Rate: 11 mm/hr (ref 0–30)

## 2019-07-07 LAB — URIC ACID: Uric Acid, Serum: 4.5 mg/dL (ref 2.4–7.0)

## 2019-07-07 LAB — VITAMIN D 25 HYDROXY (VIT D DEFICIENCY, FRACTURES): VITD: 89.53 ng/mL (ref 30.00–100.00)

## 2019-07-07 LAB — TSH: TSH: 2.76 u[IU]/mL (ref 0.35–4.50)

## 2019-07-07 MED ORDER — CYANOCOBALAMIN 1000 MCG/ML IJ SOLN
1000.0000 ug | Freq: Once | INTRAMUSCULAR | Status: AC
  Administered 2019-07-07: 1000 ug via INTRAMUSCULAR

## 2019-07-07 NOTE — Patient Instructions (Addendum)
Exercises 3x a week Lab downstairs B12 injection Xray downstairs Continue vitamins  See me again in 4 weeks for thoracic spine pain

## 2019-07-07 NOTE — Assessment & Plan Note (Signed)
Likely underlying and causing some of the discomfort.  We discussed different medications and patient still declines at the moment.

## 2019-07-07 NOTE — Progress Notes (Signed)
Subjective:   I, Nicole Waller, am serving as a scribe for Dr. Hulan Saas.  Chief Complaint: Nicole Waller, DOB: 04/04/1966, is a 53 y.o. female who presents for No chief complaint on file.   Injury date : 05/16/2019 Visit #: 2  Previous imaging   History of Present Illness:   06/07/2019 I do believe the patient did have a history of a traumatic brain injury and likely contributed to some of the difficulty patient had initially.  Patient is scheduled for an MRI and we will further evaluate to see if there is any white matter changes that could be consistent with a traumatic brain injury or more with patient's chronic migraines.  I do believe that there is an underlying anxiety that is likely exacerbating some of the symptoms.  We discussed which medications are with mild in which ones could be affecting some of her concentrations.  We discussed with her about activity and exercise prescription which will be good.  Patient was doing much better it sounds like when she was doing her yoga and going to the gym regularly.  Encouraged her to try to consider to find a home regimen that would be beneficial.  Discussed icing regimen.  Discussed which activities of doing which wants to avoid.  Follow-up with me again in 4 to 8 weeks  Update 07/07/2019 Patient states that she has been improving using the DHEA and choline. Patient states that she has been on prednisone and a zpack but is no longer.    Is having left sided thoracic spine pain since returning exercise classes.  Patient states that it is a dull, throbbing aching pain.  Patient states sometimes he can stop her from certain activities.  Review of Systems:  No , visual changes, nausea, vomiting, diarrhea, constipation, dizziness, abdominal pain, skin rash, fevers, chills, night sweats, weight loss, swollen lymph nodes, body aches, joint swelling, muscle aches, chest pain, shortness of breath, mood changes.   +Headache   Review of  History: Past Medical History:  Past Medical History:  Diagnosis Date  . Abdominal pain    in past  . Abnormal chest CT    in past  . Abnormal EKG   . Anxiety    since head injury  . Chronic cough   . Chronic headache   . Concussion    past injury from horse  . Dyspnea   . Exercise-induced asthma   . Head injury 04/2013   due to horse injury/ hit backwards by horse head!  . History of nephrolithiasis   . Hx of migraines   . Post-operative nausea and vomiting   . Pulmonary nodule 2011  . Rosacea      Past Surgical History:  has a past surgical history that includes Shoulder surgery (Bilateral); Foot surgery (Left, 1991); and Elbow surgery (Right). Family History: family history includes Breast cancer in her maternal grandmother; Cancer (age of onset: 66) in her maternal grandmother; Colitis in her father; Colon polyps in her father and mother; Diabetes in her mother and paternal grandfather; Heart disease in her father; Hyperlipidemia in her father and sister; Hypertension in her father and sister; Lupus in her mother; Rheum arthritis in her mother. no family history of autoimmune Social History:  reports that she has never smoked. She has never used smokeless tobacco. She reports that she does not drink alcohol or use drugs. Current Medications: has a current medication list which includes the following prescription(s): albuterol, amphetamine er, aspirin, azithromycin, b complex-b12,  calcium, cetirizine, vitamin d, choline, coq10, fluconazole, imitrex, magnesium, meloxicam, methocarbamol, metronidazole, montelukast, multivitamin, NON FORMULARY, norgestrel-ethinyl estradiol, nutritional supplements, fish oil, pyridoxine hcl, qvar redihaler, sumatriptan, valacyclovir, and zinc gluconate. Allergies: is allergic to accolate [zafirlukast]; codeine; latex; lyrica [pregabalin]; oxycodone; probiotic [acidophilus]; and tape.  Objective:    Physical Examination Vitals:   07/07/19 1240  BP:  122/70  Pulse: 86  SpO2: 96%   General: No apparent distress alert and oriented x3 mood and affect normal, dressed appropriately.  HEENT: Pupils equal, extraocular movements intact  Respiratory: Patient's speak in full sentences and does not appear short of breath  Cardiovascular: No lower extremity edema, non tender, no erythema  Skin: Warm dry intact with no signs of infection or rash on extremities or on axial skeleton.  Abdomen: Soft nontender  Neuro: Cranial nerves II through XII are intact, neurovascularly intact in all extremities with 2+ DTRs and 2+ pulses.  Lymph: No lymphadenopathy of posterior or anterior cervical chain or axillae bilaterally.  Gait normal with good balance and coordination.  MSK:  Non tender with full range of motion and good stability and symmetric strength and tone of shoulders, elbows, wrist,  knee and ankles bilaterally.  Psychiatric: Oriented X3, intact recent and remote memory, judgement and insight, normal mood and affect  Neck: Inspection  Loss of lordosis . No palpable stepoffs. Negative Spurling's maneuver. Mild decrease in sidebending of the neck bilaterally. Grip strength and sensation normal in bilateral hands Strength good C4 to T1 distribution No sensory change to C4 to T1 Negative Hoffman sign bilaterally Reflexes normal some mild no scapular dyskinesis noted   Assessment:    Cervical spine pain - Plan: DG Cervical Spine With Flex & Extend  Polyarthralgia - Plan: Angiotensin converting enzyme, ANA, Calcium, ionized, CBC with Differential/Platelet, Comprehensive metabolic panel, C-reactive protein, Cyclic citrul peptide antibody, IgG, Ferritin, IBC panel, PTH, intact and calcium, Rheumatoid factor, Sedimentation rate, Testosterone, TSH, Uric acid, VITAMIN D 25 Hydroxy (Vit-D Deficiency, Fractures), B12, Zinc  B12 deficiency - Plan: cyanocobalamin ((VITAMIN B-12)) injection 1,000 mcg  Traumatic brain injury, without loss of consciousness,  sequela ([place])   [id number]; 15 additional minutes spent for Therapeutic exercises as stated in above notes.  This included exercises focusing on stretching, strengthening, with significant focus on eccentric aspects.   Long term goals include an improvement in range of motion, strength, endurance as well as avoiding reinjury. Patient's frequency would include in 1-2 times a day, 3-5 times a week for a duration of 6-12 weeks.  Exercises that included:  Basic scapular stabilization to include adduction and depression of scapula Scaption, focusing on proper movement and good control Internal and External rotation utilizing a theraband, with elbow tucked at side entire time Rows with theraband   Proper technique shown and discussed handout in great detail with ATC.  All questions were discussed and answered.

## 2019-07-07 NOTE — Assessment & Plan Note (Signed)
History of traumatic brain injury but I do not find any significant concussion type symptoms previously.  MRI patient had worse completely unremarkable.  Patient has made significant progress and is starting to ride horses again.  Patient still states that she has some symptoms which includes a lack of the case, change in handwriting and some forgetfulness.  Laboratory work done to see if there is anything else.  Follow-up again in 4 to 6 weeks

## 2019-07-07 NOTE — Assessment & Plan Note (Signed)
Patient has more of a scapular dyskinesis likely secondary to muscle imbalances.  Patient does have more of a trigger nodule noted of the left parascapular region.  History of pulmonary nodules and may need to consider further evaluation if this continues for a long amount of time as well as with patient having a history of kidney stones.  We discussed posture and ergonomics, which activities to do which wants to avoid.  Follow-up with me again 4 to 6 weeks.

## 2019-07-09 ENCOUNTER — Encounter: Payer: Self-pay | Admitting: Family Medicine

## 2019-07-09 LAB — ZINC: Zinc: 67 ug/dL (ref 60–130)

## 2019-07-09 LAB — ANGIOTENSIN CONVERTING ENZYME: Angiotensin-Converting Enzyme: 35 U/L (ref 9–67)

## 2019-07-09 LAB — CYCLIC CITRUL PEPTIDE ANTIBODY, IGG: Cyclic Citrullin Peptide Ab: 27 UNITS — ABNORMAL HIGH

## 2019-07-09 LAB — ANA: Anti Nuclear Antibody (ANA): POSITIVE — AB

## 2019-07-09 LAB — RHEUMATOID FACTOR: Rheumatoid fact SerPl-aCnc: <14 IU/mL (ref <14)

## 2019-07-09 LAB — ANTI-NUCLEAR AB-TITER (ANA TITER): ANA Titer 1: 1:160 {titer} — ABNORMAL HIGH

## 2019-07-09 LAB — PTH, INTACT AND CALCIUM
Calcium: 9.6 mg/dL (ref 8.6–10.4)
PTH: 21 pg/mL (ref 14–64)

## 2019-07-09 LAB — CALCIUM, IONIZED: Calcium, Ion: 5.37 mg/dL (ref 4.8–5.6)

## 2019-07-10 ENCOUNTER — Other Ambulatory Visit: Payer: Self-pay | Admitting: Physician Assistant

## 2019-07-10 DIAGNOSIS — S060X0A Concussion without loss of consciousness, initial encounter: Secondary | ICD-10-CM

## 2019-07-12 ENCOUNTER — Other Ambulatory Visit: Payer: Self-pay

## 2019-07-12 DIAGNOSIS — R768 Other specified abnormal immunological findings in serum: Secondary | ICD-10-CM

## 2019-07-14 ENCOUNTER — Other Ambulatory Visit: Payer: Self-pay | Admitting: Internal Medicine

## 2019-07-28 ENCOUNTER — Other Ambulatory Visit: Payer: Self-pay | Admitting: Internal Medicine

## 2019-07-28 MED ORDER — TRELEGY ELLIPTA 100-62.5-25 MCG/INH IN AEPB: INHALATION_SPRAY | RESPIRATORY_TRACT | 3 refills | Status: DC

## 2019-07-28 NOTE — Progress Notes (Signed)
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 female seen in consultation per request of Dr. Hulan Saas.  Patient states that she has been riding horses all her life.  In 2014 she had a concussion injury and was seen by Dr. Tomi Likens.  She states she has had headaches and some memory loss after that.  She states in July 2020 she fell off the horse again and had another concussion.  After that she was having headaches memory loss and also discomfort in her bilateral shoulders.  She states she was evaluated by Dr. Tamala Julian who did have a MRI of her brain which was negative.  She also had MRI of her left shoulder which was unremarkable.  She had a cortisone injection to her left shoulder which was helpful.  She states she has been going to a massage therapist.  She has been experiencing pain in her hands since the last summer.  She states her left hand is more painful than the right.  She has significant morning stiffness.  She denies any joint swelling.  She states she has been active all her life and played volleyball.  She has lot of aches and pains due to being very active.  She rides horses on a regular basis, she does yoga and weight training.  Her mother has rheumatoid arthritis.  Activities of Daily Living:  Patient reports morning stiffness for 2 hours.   Patient Denies nocturnal pain.  Difficulty dressing/grooming: Denies Difficulty climbing stairs: Denies Difficulty getting out of chair: Denies Difficulty using hands for taps, buttons, cutlery, and/or writing: Denies  Review of Systems  Constitutional: Positive for fatigue. Negative for night sweats, weight gain and weight loss.  HENT: Negative  for mouth sores, trouble swallowing, trouble swallowing, mouth dryness and nose dryness.   Eyes: Positive for dryness. Negative for pain, redness and visual disturbance.  Respiratory: Negative for cough, hemoptysis, shortness of breath and difficulty breathing.   Cardiovascular: Negative for chest pain, palpitations, hypertension, irregular heartbeat and swelling in legs/feet.  Gastrointestinal: Negative for blood in stool, constipation and diarrhea.  Endocrine: Negative for increased urination.  Genitourinary: Negative for painful urination and vaginal dryness.  Musculoskeletal: Positive for arthralgias, joint pain, myalgias, morning stiffness and myalgias. Negative for joint swelling, muscle weakness and muscle tenderness.  Skin: Negative for color change, pallor, rash, hair loss, nodules/bumps, skin tightness, ulcers and sensitivity to sunlight.  Allergic/Immunologic: Negative for susceptible to infections.  Neurological: Positive for headaches and memory loss. Negative for dizziness, numbness, night sweats and weakness.  Hematological: Negative for swollen glands.  Psychiatric/Behavioral: Positive for depressed mood. Negative for sleep disturbance. The patient is nervous/anxious.     PMFS History:  Patient Active Problem List   Diagnosis Date Noted  . Positive ANA (antinuclear antibody) 08/05/2019  . Elevated testosterone level 08/05/2019  . Scapular dyskinesis 07/07/2019  . Benign neoplasm of descending colon 07/08/2017  . Allergic asthma 06/11/2017  . Elevated BP, Screening 04/16/2016  . Elevated cholesterol, screening 04/16/2016  . Other abnormal glucose, screening 04/16/2016  . TBI (  traumatic brain injury) ([place]) 04/03/2015  . Vitamin D deficiency 04/02/2015  . Medication management 04/02/2015  . CKD Stage 2 (GFR 72 ml/min) 04/02/2015  . Allergic rhinitis 04/02/2015  . Anxiety   . Hx of migraines   . Rosacea   . History of nephrolithiasis   . Cough variant asthma 12/19/2010   . PULMONARY NODULE 06/27/2010  . EPIGASTRIC PAIN 12/25/2009  . ABNORMAL EKG 05/10/2009    Past Medical History:  Diagnosis Date  . Abdominal pain    in past  . Abnormal chest CT    in past  . Abnormal EKG   . Anxiety    since head injury  . Chronic cough   . Chronic headache   . Concussion    past injury from horse  . Dyspnea   . Exercise-induced asthma   . Head injury 04/2013   due to horse injury/ hit backwards by horse head!  . History of nephrolithiasis   . Hx of migraines   . Post-operative nausea and vomiting   . Pulmonary nodule 2011  . Rosacea     Family History  Problem Relation Age of Onset  . Colon polyps Father   . Colitis Father   . Heart disease Father   . Hypertension Father   . Hyperlipidemia Father   . Breast cancer Maternal Grandmother   . Cancer Maternal Grandmother 66       Breast,uterine,ovarian  . Colon polyps Mother   . Diabetes Mother   . Rheum arthritis Mother   . Lupus Mother   . Diabetes Paternal Grandfather   . Hypertension Sister   . Hyperlipidemia Sister    Past Surgical History:  Procedure Laterality Date  . ELBOW SURGERY Right    2012/ tendon and reattachment left arm in 2018  . EYE SURGERY Right    PVD  . FOOT SURGERY Left 1991   bunionectomy  . SHOULDER SURGERY Bilateral    right-2003/left 2005 due to bone spurs   Social History   Social History Narrative  . Not on file   Immunization History  Administered Date(s) Administered  . Influenza Split 08/11/2014, 08/19/2017  . Influenza Whole 08/11/2012  . PPD Test 04/16/2016, 05/12/2017, 05/26/2018, 06/23/2019  . Pneumococcal Conjugate-13 10/09/2017  . Pneumococcal Polysaccharide-23 12/04/2017  . Pneumococcal-Unspecified 11/11/2006  . Td 11/11/2006  . Tdap 05/12/2017     Objective: Vital Signs: BP (!) 140/91 (BP Location: Right Arm, Patient Position: Sitting, Cuff Size: Small)   Pulse 87   Resp 12   Ht 5' 7.5" (1.715 m)   Wt 162 lb 9.6 oz (73.8 kg)   BMI  25.09 kg/m    Physical Exam Vitals signs and nursing note reviewed.  Constitutional:      Appearance: She is well-developed.  HENT:     Head: Normocephalic and atraumatic.  Eyes:     Conjunctiva/sclera: Conjunctivae normal.  Neck:     Musculoskeletal: Normal range of motion.  Cardiovascular:     Rate and Rhythm: Normal rate and regular rhythm.     Heart sounds: Normal heart sounds.  Pulmonary:     Effort: Pulmonary effort is normal.     Breath sounds: Normal breath sounds.  Abdominal:     General: Bowel sounds are normal.     Palpations: Abdomen is soft.  Lymphadenopathy:     Cervical: No cervical adenopathy.  Skin:    General: Skin is warm and dry.     Capillary Refill: Capillary refill takes less than 2  seconds.  Neurological:     Mental Status: She is alert and oriented to person, place, and time.  Psychiatric:        Behavior: Behavior normal.      Musculoskeletal Exam: C-spine thoracic and lumbar spine were in good range of motion.  She had no SI joint tenderness.  She has good range of motion of her shoulder joints with some discomfort in her left shoulder.  She has mild contracture in her left elbow joint because of previous injury.  Wrist joints MCPs with good range of motion.  She has bilateral PIP and DIP thickening and CMC prominence consistent with osteoarthritis.  No synovitis was noted.  Hip joints, knee joints, ankles, MTPs and PIPs with good range of motion.  She has left knee Osgood-Schlatter disease.  CDAI Exam: CDAI Score: - Patient Global: -; Provider Global: - Swollen: -; Tender: - Joint Exam   No joint exam has been documented for this visit   There is currently no information documented on the homunculus. Go to the Rheumatology activity and complete the homunculus joint exam.  Investigation: Findings:  07/07/19: ANA 1:160 NH, CCP 27, CRP<1, sed rate 11, Ace 35, RF<14, ferritin 67.7, TSH 2.76, Vitamin B12 >1500, Vitamin D 89, uric acid 4.5,  testosterone 92, iron 150, ferritin 67.7, PTH 21  Component     Latest Ref Rng & Units 07/07/2019  ANA Titer 1     titer 1:160 (H)  ANA Pattern 1      Nuclear, Homogeneous (A)  Calcium Ionized     4.8 - 5.6 mg/dL 5.37  Anti Nuclear Antibody (ANA)     NEGATIVE POSITIVE (A)  Angiotensin-Converting Enzyme     9 - 67 U/L 35   Component     Latest Ref Rng & Units 07/07/2019  Iron     42 - 145 ug/dL 150 (H)  Transferrin     212.0 - 360.0 mg/dL 282.0  Saturation Ratios     20.0 - 50.0 % 38.0  PTH, Intact     14 - 64 pg/mL 21  Calcium     8.6 - 10.4 mg/dL 9.6  Zinc     60 - 130 mcg/dL 67  Vitamin B12     211 - 911 pg/mL >1500 (H)  VITD     30.00 - 100.00 ng/mL 89.53  Uric Acid, Serum     2.4 - 7.0 mg/dL 4.5  TSH     0.35 - 4.50 uIU/mL 2.76  Testosterone     15.00 - 40.00 ng/dL 92.16 (H)  Sed Rate     0 - 30 mm/hr 11  RA Latex Turbid.     <14 IU/mL <14  Ferritin     10.0 - [id number] ng/mL 99991111  Cyclic Citrullin Peptide Ab     UNITS 27 (H)  CRP     0.5 - 20.0 mg/dL <1.0   Imaging: Xr Hand 2 View Left  Result Date: 08/11/2019 Minimal PIP narrowing was noted.  No MCP, metacarpocarpal, intercarpal radiocarpal joint space narrowing was noted.  No erosive changes were noted. Impression: These findings are consistent with mild osteoarthritis of the hand.  Xr Hand 2 View Right  Result Date: 08/11/2019 Minimal PIP narrowing was noted.  No MCP, metacarpocarpal, intercarpal radiocarpal joint space narrowing was noted.  No erosive changes were noted. Impression: These findings are consistent with mild osteoarthritis of the hand.   Recent Labs: Lab Results  Component Value Date   WBC 8.8 07/07/2019  HGB 13.2 07/07/2019   PLT 254.0 07/07/2019   NA 137 07/07/2019   K 3.7 07/07/2019   CL 106 07/07/2019   CO2 22 07/07/2019   GLUCOSE 85 07/07/2019   BUN 15 07/07/2019   CREATININE 0.82 07/07/2019   BILITOT 0.4 07/07/2019   ALKPHOS 75 07/07/2019   AST 22 07/07/2019   ALT 19  07/07/2019   PROT 7.1 07/07/2019   ALBUMIN 4.1 07/07/2019   CALCIUM 9.6 07/07/2019   CALCIUM 9.5 07/07/2019   GFRAA 88 06/23/2019    Speciality Comments: No specialty comments available.  Procedures:  No procedures performed Allergies: Accolate [zafirlukast], Codeine, Latex, Lyrica [pregabalin], Oxycodone, Probiotic [acidophilus], and Tape   Assessment / Plan:     Visit Diagnoses: Positive ANA (antinuclear antibody) -patient has positive ANA and also complains about discomfort in her hands.  She has not noticed any visible swelling.  There is positive history of autoimmune disease in her family.  I will obtain additional labs today.  07/07/19: ANA 1:160 NH, CCP 27, CRP<1, sed rate 11, Ace 35, RF<14, ferritin 67.7, TSH 2.76, Vitamin B12 >1500, Vitamin D 89, uric acid 4.5, testosterone 92, iron - Plan: Anti-scleroderma antibody, RNP Antibody, Anti-Smith antibody, Sjogrens syndrome-A extractable nuclear antibody, Sjogrens syndrome-B extractable nuclear antibody, Anti-DNA antibody, double-stranded, C3 and C4, Beta-2 glycoprotein antibodies, Cardiolipin antibodies, IgG, IgM, IgA, Lupus Anticoagulant Eval w/Reflex  Positive anti-CCP test-I detailed discussion with patient regarding significance of anti-CCP antibody.  At this point I do not see any synovitis.  I also discussed that anti-CCP can be seen even 10 years prior to the onset of rheumatoid arthritis.  We will have to be vigilant over time.  I may also consider getting ultrasound of bilateral hands if she has ongoing pain and discomfort.  Pain in both hands -clinical findings are consistent with osteoarthritis.  I will obtain x-rays today.  Plan: XR Hand 2 View Right, XR Hand 2 View Left, x-ray of bilateral hands are consistent with mild osteoarthritis.  I will also obtain 14-3-3 eta Protein  Acute pain of both shoulders-she has been having discomfort in her shoulder since her recent fall from the horse.  She she had a cortisone injection to her  left shoulder joint.  MRI of the shoulder joint was negative.  Cough variant asthma  Lung nodule-patient states that she was evaluated by Dr. Leonides Schanz in the past and was told it was benign.  Hx of migraines-followed by Dr. Tomi Likens  History of nephrolithiasis  Elevated blood pressure reading  Family history of rheumatoid arthritis - Mother  Orders: Orders Placed This Encounter  Procedures  . XR Hand 2 View Right  . XR Hand 2 View Left  . 14-3-3 eta Protein  . Anti-scleroderma antibody  . RNP Antibody  . Anti-Smith antibody  . Sjogrens syndrome-A extractable nuclear antibody  . Sjogrens syndrome-B extractable nuclear antibody  . Anti-DNA antibody, double-stranded  . C3 and C4  . Beta-2 glycoprotein antibodies  . Cardiolipin antibodies, IgG, IgM, IgA  . Lupus Anticoagulant Eval w/Reflex   No orders of the defined types were placed in this encounter.   Face-to-face time spent with patient was 45 minutes. Greater than 50% of time was spent in counseling and coordination of care.  Follow-Up Instructions: Return for +ANA, joint pain.   Bo Merino, MD  Note - This record has been created using Editor, commissioning.  Chart creation errors have been sought, but may not always  have been located. Such creation errors do not reflect  on  the standard of medical care.

## 2019-08-04 ENCOUNTER — Ambulatory Visit (INDEPENDENT_AMBULATORY_CARE_PROVIDER_SITE_OTHER): Payer: 59 | Admitting: Family Medicine

## 2019-08-04 ENCOUNTER — Other Ambulatory Visit: Payer: Self-pay

## 2019-08-04 ENCOUNTER — Encounter: Payer: Self-pay | Admitting: Family Medicine

## 2019-08-04 VITALS — BP 104/72 | HR 107 | Ht 69.0 in | Wt 162.0 lb

## 2019-08-04 DIAGNOSIS — R768 Other specified abnormal immunological findings in serum: Secondary | ICD-10-CM

## 2019-08-04 DIAGNOSIS — F4310 Post-traumatic stress disorder, unspecified: Secondary | ICD-10-CM | POA: Diagnosis not present

## 2019-08-04 DIAGNOSIS — S069X0S Unspecified intracranial injury without loss of consciousness, sequela: Secondary | ICD-10-CM | POA: Diagnosis not present

## 2019-08-04 DIAGNOSIS — R7989 Other specified abnormal findings of blood chemistry: Secondary | ICD-10-CM | POA: Diagnosis not present

## 2019-08-04 DIAGNOSIS — Z8669 Personal history of other diseases of the nervous system and sense organs: Secondary | ICD-10-CM

## 2019-08-04 NOTE — Assessment & Plan Note (Signed)
Patient has had a history of 2 head injuries.  I believe that patient is completely resolved from either of them.  Patient still states that she has episodes of amnesia, as well as sometimes difficulty with speech.  Patient has seen many different providers.  We discussed with him in great length.  I discussed that the only thing else left would be something is such as a PET scan that would have to be done at an academic center.  They would like further evaluation at an academic center and will try to refer accordingly.  Discussed with her with the laboratory work-up and recent imaging I do not feel that anything else from our point of view would be beneficial.  Differential includes more of a PTSD secondary to most patient's symptoms seem to be around her past time of writing stenosis.  Can follow-up again after she sees other providers for her other ailments.  Spent  25 minutes with patient face-to-face and had greater than 50% of counseling including as described above in assessment and plan.

## 2019-08-04 NOTE — Patient Instructions (Signed)
Good to see you Lets see what MRI of the shoulder shows then consider if normal we might get an MRI of the neck with and without contrast I will look into a grant study at one of the academic centers to see if we can get more answers  I will touch base tomorrow or Friday

## 2019-08-04 NOTE — Progress Notes (Signed)
[facility] [address] [place], [place] [address] Phone: [phone] Subjective:   Fontaine No, am serving as a scribe for Dr. Hulan Saas.   CC: Polyarthralgia, head injury follow-up.  [id number]   07/07/2019 Patient has more of a scapular dyskinesis likely secondary to muscle imbalances.  Patient does have more of a trigger nodule noted of the left parascapular region.  History of pulmonary nodules and may need to consider further evaluation if this continues for a long amount of time as well as with patient having a history of kidney stones.  We discussed posture and ergonomics, which activities to do which wants to avoid.  Follow-up with me again 4 to 6 weeks.  History of traumatic brain injury but I do not find any significant concussion type symptoms previously.  MRI patient had worse completely unremarkable.  Patient has made significant progress and is starting to ride horses again.  Patient still states that she has some symptoms which includes a lack of the case, change in handwriting and some forgetfulness.  Laboratory work done to see if there is anything else.  Follow-up again in 4 to 6 weeks  Update 08/04/2019 CHESA MULDROW is a 53 y.o. female coming in with complaint of shoulder pain. Patient has been going to see massage therapy. Is having MRI on Friday. Celebrex is helping to alleviate her muscle pain. Is seeing Dr. Griffin Basil at [facility] [place].   lab workup showed a positive ANA and patient is awaiting referral for rheumatology to see if that is contributing.  Patient continues to have aches and pains.  Has had neck pain.  Neck x-ray showed very mild multilevel cervical degenerative disc disease but otherwise fairly unremarkable.  Patient continues to have pain in her neck and hands from time to time.  Pain can migrate though.  Patient states that she still concerned with the continued difficulty with some intermittent amnesia.  Patient  states that she has difficulty staying on task and when she is doing a task she is unable to multitask which is something she used to be able to do quite frequently.  Once again patient has had significant imaging of MRI with and without contrast over the course of the last 6 years with no significant findings.  Patient states other things such as her handwriting is changed, patient's husband states that she does seem to be emotionally more difficult to deal with as well.  He is at her side patient states even an incidence where she was driving from her house to their barn she forgot where she was going but was able to remember her husband's number and call him so he could help her find her way back to the house.      Past Medical History:  Diagnosis Date  . Abdominal pain    in past  . Abnormal chest CT    in past  . Abnormal EKG   . Anxiety    since head injury  . Chronic cough   . Chronic headache   . Concussion    past injury from horse  . Dyspnea   . Exercise-induced asthma   . Head injury 04/2013   due to horse injury/ hit backwards by horse head!  . History of nephrolithiasis   . Hx of migraines   . Post-operative nausea and vomiting   . Pulmonary nodule 2011  . Rosacea    Past Surgical History:  Procedure Laterality Date  .  ELBOW SURGERY Right    2012/ tendon and reattachment left arm in 2018  . FOOT SURGERY Left 1991   bunionectomy  . SHOULDER SURGERY Bilateral    right-2003/left 2005 due to bone spurs   Social History   Socioeconomic History  . Marital status: Married    Spouse name: Nicole Kindred  . Number of children: Not on file  . Years of education: Not on file  . Highest education level: Not on file  Occupational History  . Not on file  Social Needs  . Financial resource strain: Not on file  . Food insecurity    Worry: Not on file    Inability: Not on file  . Transportation needs    Medical: Not on file    Non-medical: Not on file  Tobacco Use  . Smoking  status: Never Smoker  . Smokeless tobacco: Never Used  Substance and Sexual Activity  . Alcohol use: No  . Drug use: No  . Sexual activity: Not on file  Lifestyle  . Physical activity    Days per week: Not on file    Minutes per session: Not on file  . Stress: Not on file  Relationships  . Social Herbalist on phone: Not on file    Gets together: Not on file    Attends religious service: Not on file    Active member of club or organization: Not on file    Attends meetings of clubs or organizations: Not on file    Relationship status: Not on file  Other Topics Concern  . Not on file  Social History Narrative  . Not on file   Allergies  Allergen Reactions  . Accolate [Zafirlukast]     GI Upset  . Codeine     REACTION: nausea and vomiting  . Latex Rash  . Lyrica [Pregabalin]     Fatigue  . Oxycodone     nauseated  . Probiotic [Acidophilus]     Headache  . Tape     Causes redness, and skin gets raw!   Family History  Problem Relation Age of Onset  . Colon polyps Father   . Colitis Father   . Heart disease Father   . Hypertension Father   . Hyperlipidemia Father   . Breast cancer Maternal Grandmother   . Cancer Maternal Grandmother 19       Breast,uterine,ovarian  . Colon polyps Mother   . Diabetes Mother   . Rheum arthritis Mother   . Lupus Mother   . Diabetes Paternal Grandfather   . Hypertension Sister   . Hyperlipidemia Sister     Current Outpatient Medications (Endocrine & Metabolic):  .  norgestrel-ethinyl estradiol (CRYSELLE-28) 0.3-30 MG-MCG tablet, Take 1 tablet by mouth daily.   Current Outpatient Medications (Respiratory):  .  albuterol (PROAIR HFA) 108 (90 Base) MCG/ACT inhaler, Use 2 inhalations 15 minutes apart every 4 hours to Rescue Asthma .  cetirizine (ZYRTEC) 10 MG tablet, Take 10 mg by mouth daily. .  Fluticasone-Umeclidin-Vilant (TRELEGY ELLIPTA) 100-62.5-25 MCG/INH AEPB, Inhale 1 puff into the lungs daily, THEN 1 puff daily.  .  montelukast (SINGULAIR) 10 MG tablet, Take 1 tablet Daily for Allergies  Current Outpatient Medications (Analgesics):  .  aspirin 81 MG tablet, Take 81 mg by mouth. Take one pill every 4 days .  IMITREX 100 MG tablet, Take 1 tablet at onset of Migraine and may repeat 1 x in 2 hours (Max 2 tablets /24 hours) .  meloxicam (MOBIC) 15 MG tablet, Take 1/2 to 1 tablet daily with Food for Pain & Inflammation & Limit to 4-5 days/week to Avoid Kidney Damage .  SUMAtriptan (IMITREX) 20 MG/ACT nasal spray, May repeat in 2 hours if headache persists or recurs.   Current Outpatient Medications (Other):  Marland Kitchen  Amphetamine ER (ADZENYS XR-ODT) 9.4 MG TBED, Take 1 tablet by mouth daily. Marland Kitchen  azithromycin (ZITHROMAX) 250 MG tablet, Take 2 tablets (500 mg) on  Day 1,  followed by 1 tablet (250 mg) once daily on Days 2 through 5. .  B Complex Vitamins (B COMPLEX-B12) TABS, Take 1 tablet by mouth daily. .  Calcium 600-200 MG-UNIT per tablet, Take 1 tablet by mouth every other day. Every three days .  Cholecalciferol (VITAMIN D) 125 MCG (5000 UT) CAPS, Take 1 capsule by mouth daily. .  CHOLINE PO, Take 1 tablet by mouth daily. .  Coenzyme Q10 (COQ10) 100 MG CAPS, Take by mouth daily. .  fluconazole (DIFLUCAN) 150 MG tablet, Take 1 tablet 2 x /week  as needed for yeast infection .  MAGNESIUM GLYCINATE PLUS PO, Take 400 mg by mouth 2 (two) times daily. .  methocarbamol (ROBAXIN) 500 MG tablet, Take 1 tablet (500 mg total) by mouth 2 (two) times daily as needed for muscle spasms. .  metronidazole (NORITATE) 1 % cream, Apply topically daily. .  Multiple Vitamin (MULTIVITAMIN) capsule, Take 1 capsule by mouth daily.   .  NON FORMULARY, Metro lotion prn for rosecea .  Nutritional Supplements (DHEA PO)*, Take 1 tablet by mouth daily. .  Omega-3 Fatty Acids (FISH OIL) 1000 MG CAPS, Take 1,000 mg by mouth daily. .  Pyridoxine HCl (VITAMIN B-6 PO), Take 1 tablet by mouth daily. .  valACYclovir (VALTREX) 500 MG tablet, Take  500 mg by mouth daily as needed (for outbreak).  .  zinc gluconate 50 MG tablet, Take 50 mg by mouth daily. * These medications belong to multiple therapeutic classes and are listed under each applicable group.    Past medical history, social, surgical and family history all reviewed in electronic medical record.  No pertanent information unless stated regarding to the chief complaint.   Review of Systems:  No , visual changes, nausea, vomiting, diarrhea, constipation, dizziness, abdominal pain, skin rash, fevers, chills, night sweats, weight loss, swollen lymph nodes, body aches, joint swelling,  chest pain, shortness of breath, mood changes.  Positive headache, muscle aches  Objective  Blood pressure 104/72, pulse (!) 107, height 5\' 9"  (1.753 m), weight 162 lb (73.5 kg), SpO2 97 %.    General: No apparent distress alert and oriented x3 mood and affect normal, dressed appropriately.  HEENT: Pupils equal, extraocular movements intact  Respiratory: Patient's speak in full sentences and does not appear short of breath  Cardiovascular: No lower extremity edema, non tender, no erythema  Skin: Warm dry intact with no signs of infection or rash on extremities or on axial skeleton.  Abdomen: Soft nontender  Neuro: Cranial nerves II through XII are intact, neurovascularly intact in all extremities with 2+ DTRs and 2+ pulses.  Lymph: No lymphadenopathy of posterior or anterior cervical chain or axillae bilaterally.  Gait normal with good balance and coordination.  MSK:  tender with full range of motion and good stability and symmetric strength and tone of , elbows, wrist, hip, knee and ankles bilaterally.      Impression and Recommendations:     This case required medical decision making of moderate complexity. The above  documentation has been reviewed and is accurate and complete Lyndal Pulley, DO       Note: This dictation was prepared with Dragon dictation along with smaller phrase  technology. Any transcriptional errors that result from this process are unintentional.

## 2019-08-05 ENCOUNTER — Other Ambulatory Visit: Payer: Self-pay | Admitting: Obstetrics and Gynecology

## 2019-08-05 ENCOUNTER — Encounter: Payer: Self-pay | Admitting: Family Medicine

## 2019-08-05 DIAGNOSIS — R7989 Other specified abnormal findings of blood chemistry: Secondary | ICD-10-CM

## 2019-08-05 DIAGNOSIS — R7689 Other specified abnormal immunological findings in serum: Secondary | ICD-10-CM | POA: Insufficient documentation

## 2019-08-05 DIAGNOSIS — R768 Other specified abnormal immunological findings in serum: Secondary | ICD-10-CM | POA: Insufficient documentation

## 2019-08-05 DIAGNOSIS — Z9189 Other specified personal risk factors, not elsewhere classified: Secondary | ICD-10-CM

## 2019-08-05 HISTORY — DX: Other specified abnormal findings of blood chemistry: R79.89

## 2019-08-05 NOTE — Assessment & Plan Note (Signed)
Positive ANA.  Discussed with patient for referral to rheumatology which patient is awaiting.

## 2019-08-05 NOTE — Assessment & Plan Note (Signed)
Seen and labs as well.  Seen multiple providers and is awaiting a pelvic ultrasound to evaluate for any cyst.

## 2019-08-11 ENCOUNTER — Ambulatory Visit: Payer: Self-pay

## 2019-08-11 ENCOUNTER — Encounter: Payer: Self-pay | Admitting: Rheumatology

## 2019-08-11 ENCOUNTER — Other Ambulatory Visit: Payer: Self-pay

## 2019-08-11 ENCOUNTER — Ambulatory Visit: Payer: 59 | Admitting: Rheumatology

## 2019-08-11 VITALS — BP 140/91 | HR 87 | Resp 12 | Ht 67.5 in | Wt 162.6 lb

## 2019-08-11 DIAGNOSIS — M79642 Pain in left hand: Secondary | ICD-10-CM | POA: Diagnosis not present

## 2019-08-11 DIAGNOSIS — R7689 Other specified abnormal immunological findings in serum: Secondary | ICD-10-CM

## 2019-08-11 DIAGNOSIS — J45991 Cough variant asthma: Secondary | ICD-10-CM

## 2019-08-11 DIAGNOSIS — M25511 Pain in right shoulder: Secondary | ICD-10-CM | POA: Diagnosis not present

## 2019-08-11 DIAGNOSIS — M79641 Pain in right hand: Secondary | ICD-10-CM | POA: Diagnosis not present

## 2019-08-11 DIAGNOSIS — R7681 Abnormal rheumatoid factor and anti-citrullinated protein antibody without rheumatoid arthritis: Secondary | ICD-10-CM

## 2019-08-11 DIAGNOSIS — R911 Solitary pulmonary nodule: Secondary | ICD-10-CM

## 2019-08-11 DIAGNOSIS — R768 Other specified abnormal immunological findings in serum: Secondary | ICD-10-CM

## 2019-08-11 DIAGNOSIS — Z8261 Family history of arthritis: Secondary | ICD-10-CM

## 2019-08-11 DIAGNOSIS — R030 Elevated blood-pressure reading, without diagnosis of hypertension: Secondary | ICD-10-CM

## 2019-08-11 DIAGNOSIS — Z8669 Personal history of other diseases of the nervous system and sense organs: Secondary | ICD-10-CM

## 2019-08-11 DIAGNOSIS — Z87442 Personal history of urinary calculi: Secondary | ICD-10-CM

## 2019-08-11 DIAGNOSIS — M25512 Pain in left shoulder: Secondary | ICD-10-CM

## 2019-08-11 NOTE — Progress Notes (Signed)
Office Visit Note  Patient: Nicole Waller             Date of Birth: 04/09/1966           MRN: [id number]             PCP: Unk Pinto, MD Referring: Unk Pinto, MD Visit Date: 08/25/2019 Occupation: @GUAROCC @  Subjective:  Pain in both hands.   History of Present Illness: Nicole Waller is a 53 y.o. female with history of arthralgias.  She states she continues to have pain and stiffness in her bilateral hands.  She noticed some puffiness in her left second MCP joint.  She is a still going to the orthopedic surgeon for ongoing discomfort in the left shoulder joint.  None of the other joints are painful.  Activities of Daily Living:  Patient reports morning stiffness for 30 minutes.   Patient Denies nocturnal pain.  Difficulty dressing/grooming: Denies Difficulty climbing stairs: Denies Difficulty getting out of chair: Denies Difficulty using hands for taps, buttons, cutlery, and/or writing: Denies  Review of Systems  Constitutional: Negative for fatigue, night sweats, weight gain and weight loss.  HENT: Negative for mouth sores, trouble swallowing, trouble swallowing, mouth dryness and nose dryness.   Eyes: Positive for dryness. Negative for pain, redness and visual disturbance.  Respiratory: Negative for cough, shortness of breath and difficulty breathing.   Cardiovascular: Negative for chest pain, palpitations, hypertension, irregular heartbeat and swelling in legs/feet.  Gastrointestinal: Negative for blood in stool, constipation and diarrhea.  Endocrine: Negative for increased urination.  Genitourinary: Negative for vaginal dryness.  Musculoskeletal: Positive for arthralgias, joint pain and morning stiffness. Negative for joint swelling, myalgias, muscle weakness, muscle tenderness and myalgias.  Skin: Negative for color change, rash, hair loss, skin tightness, ulcers and sensitivity to sunlight.  Allergic/Immunologic: Negative for susceptible to infections.   Neurological: Negative for dizziness, memory loss, night sweats and weakness.  Hematological: Negative for swollen glands.  Psychiatric/Behavioral: Positive for depressed mood. Negative for sleep disturbance. The patient is nervous/anxious.     PMFS History:  Patient Active Problem List   Diagnosis Date Noted  . Positive ANA (antinuclear antibody) 08/05/2019  . Elevated testosterone level 08/05/2019  . Scapular dyskinesis 07/07/2019  . Benign neoplasm of descending colon 07/08/2017  . Allergic asthma 06/11/2017  . Elevated BP, Screening 04/16/2016  . Elevated cholesterol, screening 04/16/2016  . Other abnormal glucose, screening 04/16/2016  . TBI (traumatic brain injury) ([place]) 04/03/2015  . Vitamin D deficiency 04/02/2015  . Medication management 04/02/2015  . CKD Stage 2 (GFR 72 ml/min) 04/02/2015  . Allergic rhinitis 04/02/2015  . Anxiety   . Hx of migraines   . Rosacea   . History of nephrolithiasis   . Cough variant asthma 12/19/2010  . PULMONARY NODULE 06/27/2010  . EPIGASTRIC PAIN 12/25/2009  . ABNORMAL EKG 05/10/2009    Past Medical History:  Diagnosis Date  . Abdominal pain    in past  . Abnormal chest CT    in past  . Abnormal EKG   . Anxiety    since head injury  . Chronic cough   . Chronic headache   . Concussion    past injury from horse  . Dyspnea   . Exercise-induced asthma   . Head injury 04/2013   due to horse injury/ hit backwards by horse head!  . History of nephrolithiasis   . Hx of migraines   . Post-operative nausea and vomiting   . Pulmonary nodule 2011  .  Rosacea     Family History  Problem Relation Age of Onset  . Colon polyps Father   . Colitis Father   . Heart disease Father   . Hypertension Father   . Hyperlipidemia Father   . Breast cancer Maternal Grandmother   . Cancer Maternal Grandmother 52       Breast,uterine,ovarian  . Colon polyps Mother   . Diabetes Mother   . Rheum arthritis Mother   . Lupus Mother   . Diabetes  Paternal Grandfather   . Hypertension Sister   . Hyperlipidemia Sister    Past Surgical History:  Procedure Laterality Date  . ELBOW SURGERY Right    2012/ tendon and reattachment left arm in 2018  . EYE SURGERY Right    PVD  . FOOT SURGERY Left 1991   bunionectomy  . SHOULDER SURGERY Bilateral    right-2003/left 2005 due to bone spurs   Social History   Social History Narrative  . Not on file   Immunization History  Administered Date(s) Administered  . Influenza Split 08/11/2014, 08/19/2017  . Influenza Whole 08/11/2012  . PPD Test 04/16/2016, 05/12/2017, 05/26/2018, 06/23/2019  . Pneumococcal Conjugate-13 10/09/2017  . Pneumococcal Polysaccharide-23 12/04/2017  . Pneumococcal-Unspecified 11/11/2006  . Td 11/11/2006  . Tdap 05/12/2017     Objective: Vital Signs: BP 129/87 (BP Location: Left Arm, Patient Position: Sitting, Cuff Size: Small)   Pulse 82   Resp 12   Wt 160 lb 3.2 oz (72.7 kg)   BMI 24.72 kg/m    Physical Exam Vitals signs and nursing note reviewed.  Constitutional:      Appearance: She is well-developed.  HENT:     Head: Normocephalic and atraumatic.  Eyes:     Conjunctiva/sclera: Conjunctivae normal.  Neck:     Musculoskeletal: Normal range of motion.  Cardiovascular:     Rate and Rhythm: Normal rate and regular rhythm.     Heart sounds: Normal heart sounds.  Pulmonary:     Effort: Pulmonary effort is normal.     Breath sounds: Normal breath sounds.  Abdominal:     General: Bowel sounds are normal.     Palpations: Abdomen is soft.  Lymphadenopathy:     Cervical: No cervical adenopathy.  Skin:    General: Skin is warm and dry.     Capillary Refill: Capillary refill takes less than 2 seconds.  Neurological:     Mental Status: She is alert and oriented to person, place, and time.  Psychiatric:        Behavior: Behavior normal.      Musculoskeletal Exam: C-spine thoracic and lumbar spine were in good range of motion.  Shoulder joints,  elbow joints, wrist joints were in good range of motion.  She has DIP and PIP thickening in her hands but no synovitis.  Hip joints, knee joints, ankles MTPs PIPs with good range of motion with no synovitis.  CDAI Exam: CDAI Score: - Patient Global: -; Provider Global: - Swollen: -; Tender: - Joint Exam   No joint exam has been documented for this visit   There is currently no information documented on the homunculus. Go to the Rheumatology activity and complete the homunculus joint exam.  Investigation: No additional findings.  Imaging: Xr Hand 2 View Left  Result Date: 08/11/2019 Minimal PIP narrowing was noted.  No MCP, metacarpocarpal, intercarpal radiocarpal joint space narrowing was noted.  No erosive changes were noted. Impression: These findings are consistent with mild osteoarthritis of the hand.  Xr Hand 2 View Right  Result Date: 08/11/2019 Minimal PIP narrowing was noted.  No MCP, metacarpocarpal, intercarpal radiocarpal joint space narrowing was noted.  No erosive changes were noted. Impression: These findings are consistent with mild osteoarthritis of the hand.   Recent Labs: Lab Results  Component Value Date   WBC 8.8 07/07/2019   HGB 13.2 07/07/2019   PLT 254.0 07/07/2019   NA 137 07/07/2019   K 3.7 07/07/2019   CL 106 07/07/2019   CO2 22 07/07/2019   GLUCOSE 85 07/07/2019   BUN 15 07/07/2019   CREATININE 0.82 07/07/2019   BILITOT 0.4 07/07/2019   ALKPHOS 75 07/07/2019   AST 22 07/07/2019   ALT 19 07/07/2019   PROT 7.1 07/07/2019   ALBUMIN 4.1 07/07/2019   CALCIUM 9.6 07/07/2019   CALCIUM 9.5 07/07/2019   GFRAA 88 06/23/2019  August 11, 2019 ENA negative, C3-C4 normal, anticardiolipin negative, beta-2 negative, lupus anticoagulant negative  07/07/19: ANA 1:160 NH, CCP 27, CRP<1, sed rate 11, Ace 35, RF<14, ferritin 67.7, TSH 2.76, Vitamin B12 >1500, Vitamin D 89, uric acid 4.5, testosterone 92, iron 150, ferritin 67.7, PTH 21  Speciality Comments:  No specialty comments available.  Procedures:  No procedures performed Allergies: Accolate [zafirlukast], Codeine, Latex, Lyrica [pregabalin], Oxycodone, Probiotic [acidophilus], and Tape   Assessment / Plan:     Visit Diagnoses: Positive ANA (antinuclear antibody) - ENA negative, C3-C4 normal.  Patient had no clinical features of lupus.  Lab results were explained and discussed at length.  Positive anti-CCP test - Patient had no synovitis.  She continues to have discomfort in her hands.  The clinical findings are consistent with osteoarthritis.  Obtain ultrasound examination of bilateral hands today which was unremarkable.  No synovitis was noted.  I have advised her to contact me in case she develops any increased swelling.  Primary osteoarthritis of both hands -she has osteoarthritis in her both hands more prominent in her right hand.  Although she has more discomfort in her left hand.  Joint protection muscle strengthening was discussed.  A list of natural anti-inflammatories was given.  Acute pain of both shoulders - Related to recent fall from the horse.  She has been followed by orthopedics.  Lung nodule  Cough variant asthma  Hx of migraines  History of nephrolithiasis  Elevated blood pressure reading  Family history of rheumatoid arthritis - mother  Orders: No orders of the defined types were placed in this encounter.  No orders of the defined types were placed in this encounter.    Follow-Up Instructions: Return for Osteoarthritis.   Bo Merino, MD  Note - This record has been created using Editor, commissioning.  Chart creation errors have been sought, but may not always  have been located. Such creation errors do not reflect on  the standard of medical care.

## 2019-08-15 LAB — LUPUS ANTICOAGULANT EVAL W/ REFLEX
PTT-LA Screen: 31 s (ref <=40)
dRVVT: 36 s (ref <=45)

## 2019-08-15 LAB — CARDIOLIPIN ANTIBODIES, IGG, IGM, IGA
Anticardiolipin IgA: <11 [APL'U]
Anticardiolipin IgG: <14 [GPL'U]
Anticardiolipin IgM: <12 [MPL'U]

## 2019-08-15 LAB — 14-3-3 ETA PROTEIN: 14-3-3 eta Protein: <0.2 ng/mL (ref <0.2)

## 2019-08-15 LAB — C3 AND C4
C3 Complement: 112 mg/dL (ref 83–193)
C4 Complement: 26 mg/dL (ref 15–57)

## 2019-08-15 LAB — SJOGRENS SYNDROME-A EXTRACTABLE NUCLEAR ANTIBODY: SSA (Ro) (ENA) Antibody, IgG: <1 AI

## 2019-08-15 LAB — RNP ANTIBODY: Ribonucleic Protein(ENA) Antibody, IgG: <1 AI

## 2019-08-15 LAB — ANTI-SMITH ANTIBODY: ENA SM Ab Ser-aCnc: <1 AI

## 2019-08-15 LAB — SJOGRENS SYNDROME-B EXTRACTABLE NUCLEAR ANTIBODY: SSB (La) (ENA) Antibody, IgG: <1 AI

## 2019-08-15 LAB — BETA-2 GLYCOPROTEIN ANTIBODIES
Beta-2 Glyco 1 IgA: <9 SAU (ref <=20)
Beta-2 Glyco 1 IgM: 9 SMU (ref <=20)
Beta-2 Glyco I IgG: <9 SGU (ref <=20)

## 2019-08-15 LAB — ANTI-DNA ANTIBODY, DOUBLE-STRANDED: ds DNA Ab: 1 IU/mL

## 2019-08-15 LAB — ANTI-SCLERODERMA ANTIBODY: Scleroderma (Scl-70) (ENA) Antibody, IgG: <1 AI

## 2019-08-25 ENCOUNTER — Encounter: Payer: Self-pay | Admitting: Rheumatology

## 2019-08-25 ENCOUNTER — Ambulatory Visit: Payer: 59 | Admitting: Rheumatology

## 2019-08-25 ENCOUNTER — Ambulatory Visit: Payer: Self-pay

## 2019-08-25 ENCOUNTER — Other Ambulatory Visit: Payer: Self-pay

## 2019-08-25 VITALS — BP 129/87 | HR 82 | Resp 12 | Wt 160.2 lb

## 2019-08-25 DIAGNOSIS — J45991 Cough variant asthma: Secondary | ICD-10-CM

## 2019-08-25 DIAGNOSIS — R768 Other specified abnormal immunological findings in serum: Secondary | ICD-10-CM | POA: Diagnosis not present

## 2019-08-25 DIAGNOSIS — Z8261 Family history of arthritis: Secondary | ICD-10-CM

## 2019-08-25 DIAGNOSIS — M25511 Pain in right shoulder: Secondary | ICD-10-CM | POA: Diagnosis not present

## 2019-08-25 DIAGNOSIS — R030 Elevated blood-pressure reading, without diagnosis of hypertension: Secondary | ICD-10-CM

## 2019-08-25 DIAGNOSIS — M25512 Pain in left shoulder: Secondary | ICD-10-CM

## 2019-08-25 DIAGNOSIS — R911 Solitary pulmonary nodule: Secondary | ICD-10-CM

## 2019-08-25 DIAGNOSIS — Z87442 Personal history of urinary calculi: Secondary | ICD-10-CM

## 2019-08-25 DIAGNOSIS — M79642 Pain in left hand: Secondary | ICD-10-CM

## 2019-08-25 DIAGNOSIS — Z8669 Personal history of other diseases of the nervous system and sense organs: Secondary | ICD-10-CM

## 2019-08-25 DIAGNOSIS — M79641 Pain in right hand: Secondary | ICD-10-CM

## 2019-08-25 DIAGNOSIS — M19041 Primary osteoarthritis, right hand: Secondary | ICD-10-CM

## 2019-08-25 DIAGNOSIS — M19042 Primary osteoarthritis, left hand: Secondary | ICD-10-CM

## 2019-08-25 NOTE — Progress Notes (Signed)
Pharmacy Note  Subjective:  Patient presents today to the [facility] to see Dr. Estanislado Pandy.   Patient seen by the pharmacist for counseling on natural anti-inflammatories.  Objective: Outpatient Encounter Medications as of 08/25/2019  Medication Sig  . albuterol (PROAIR HFA) 108 (90 Base) MCG/ACT inhaler Use 2 inhalations 15 minutes apart every 4 hours to Rescue Asthma  . Amphetamine ER (ADZENYS XR-ODT) 9.4 MG TBED Take 1 tablet by mouth daily.  Marland Kitchen aspirin 81 MG tablet Take 81 mg by mouth. Take one pill every 4 days  . B Complex Vitamins (B COMPLEX-B12) TABS Take 1 tablet by mouth daily.  . Calcium 600-200 MG-UNIT per tablet Take 1 tablet by mouth every other day. Every three days  . cetirizine (ZYRTEC) 10 MG tablet Take 10 mg by mouth daily.  . Cholecalciferol (VITAMIN D) 125 MCG (5000 UT) CAPS Take 1 capsule by mouth daily.  . CHOLINE PO Take 1 tablet by mouth daily.  . Coenzyme Q10 (COQ10) 100 MG CAPS Take by mouth daily.  . diclofenac (VOLTAREN) 75 MG EC tablet Take 75 mg by mouth 2 (two) times daily.  . IMITREX 100 MG tablet Take 1 tablet at onset of Migraine and may repeat 1 x in 2 hours (Max 2 tablets /24 hours)  . MAGNESIUM GLYCINATE PLUS PO Take 400 mg by mouth 2 (two) times daily.  . montelukast (SINGULAIR) 10 MG tablet Take 1 tablet Daily for Allergies  . Multiple Vitamin (MULTIVITAMIN) capsule Take 1 capsule by mouth daily.    . NON FORMULARY Metro lotion prn for rosecea  . Omega-3 Fatty Acids (FISH OIL) 1000 MG CAPS Take 1,000 mg by mouth daily.  . Pyridoxine HCl (VITAMIN B-6 PO) Take 1 tablet by mouth daily.  . SUMAtriptan (IMITREX) 20 MG/ACT nasal spray May repeat in 2 hours if headache persists or recurs.  . valACYclovir (VALTREX) 500 MG tablet Take 500 mg by mouth daily as needed (for outbreak).   . zinc gluconate 50 MG tablet Take 50 mg by mouth daily.  . beclomethasone (QVAR) 80 MCG/ACT inhaler Qvar RediHaler 80 mcg/actuation HFA breath activated aerosol  TAKE 2 PUFFS 1 TO 2 TIMES A DAY FOR ASTHMA PROPHYLAXIS.  . [DISCONTINUED] azithromycin (ZITHROMAX) 250 MG tablet Take 2 tablets (500 mg) on  Day 1,  followed by 1 tablet (250 mg) once daily on Days 2 through 5.  . [DISCONTINUED] fluconazole (DIFLUCAN) 150 MG tablet Take 1 tablet 2 x /week  as needed for yeast infection (Patient not taking: Reported on 08/25/2019)  . [DISCONTINUED] Fluticasone-Umeclidin-Vilant (TRELEGY ELLIPTA) 100-62.5-25 MCG/INH AEPB Inhale 1 puff into the lungs daily, THEN 1 puff daily. (Patient not taking: Reported on 08/11/2019)  . [DISCONTINUED] meloxicam (MOBIC) 15 MG tablet Take 1/2 to 1 tablet daily with Food for Pain & Inflammation & Limit to 4-5 days/week to Avoid Kidney Damage (Patient not taking: Reported on 08/25/2019)  . [DISCONTINUED] methocarbamol (ROBAXIN) 500 MG tablet Take 1 tablet (500 mg total) by mouth 2 (two) times daily as needed for muscle spasms. (Patient not taking: Reported on 08/25/2019)  . [DISCONTINUED] metronidazole (NORITATE) 1 % cream Apply topically daily. (Patient not taking: Reported on 08/25/2019)  . [DISCONTINUED] norgestrel-ethinyl estradiol (CRYSELLE-28) 0.3-30 MG-MCG tablet Take 1 tablet by mouth daily.  . [DISCONTINUED] Nutritional Supplements (DHEA PO) Take 1 tablet by mouth daily.   No facility-administered encounter medications on file as of 08/25/2019.      Assessment/Plan:  Counseled on the purpose, proper use, and adverse effects of natural  anti-inflammatories including upset stomach and increased bleeding risk.  Encouraged patient to add one medication at a time and to include on medication list to monitor for adverse effects and drug interactions.  Given educational handout with recommended doses.  She is already on fish oil capsules and recommended to increase to 2 capsules daily.  She has tried tumeric in the past but had GI effects.  Recommended taking with food and slowly titrating to recommended dose.  Patient verbalized  understanding.  All questions encouraged and answered.  Instructed patient to call with any questions or concerns.  Mariella Saa, PharmD, [place], CPP Clinical Specialty Pharmacist [phone]  08/25/2019 1:39 PM

## 2019-09-02 ENCOUNTER — Ambulatory Visit: Payer: 59 | Admitting: Neurology

## 2019-09-07 ENCOUNTER — Other Ambulatory Visit: Payer: Self-pay | Admitting: Internal Medicine

## 2019-09-09 ENCOUNTER — Ambulatory Visit: Payer: 59 | Admitting: Psychology

## 2019-09-13 ENCOUNTER — Ambulatory Visit (INDEPENDENT_AMBULATORY_CARE_PROVIDER_SITE_OTHER): Payer: 59 | Admitting: Psychology

## 2019-09-13 DIAGNOSIS — F4323 Adjustment disorder with mixed anxiety and depressed mood: Secondary | ICD-10-CM | POA: Diagnosis not present

## 2019-09-15 ENCOUNTER — Ambulatory Visit: Payer: 59 | Admitting: Family Medicine

## 2019-09-25 ENCOUNTER — Encounter: Payer: Self-pay | Admitting: Family Medicine

## 2019-09-29 ENCOUNTER — Ambulatory Visit (INDEPENDENT_AMBULATORY_CARE_PROVIDER_SITE_OTHER): Payer: 59 | Admitting: Psychology

## 2019-09-29 DIAGNOSIS — F4323 Adjustment disorder with mixed anxiety and depressed mood: Secondary | ICD-10-CM

## 2019-10-15 ENCOUNTER — Ambulatory Visit (INDEPENDENT_AMBULATORY_CARE_PROVIDER_SITE_OTHER): Payer: 59 | Admitting: Psychology

## 2019-10-15 DIAGNOSIS — F4323 Adjustment disorder with mixed anxiety and depressed mood: Secondary | ICD-10-CM | POA: Diagnosis not present

## 2019-11-01 ENCOUNTER — Ambulatory Visit (INDEPENDENT_AMBULATORY_CARE_PROVIDER_SITE_OTHER): Payer: 59 | Admitting: Psychology

## 2019-11-01 DIAGNOSIS — F4323 Adjustment disorder with mixed anxiety and depressed mood: Secondary | ICD-10-CM | POA: Diagnosis not present

## 2019-11-25 ENCOUNTER — Ambulatory Visit (INDEPENDENT_AMBULATORY_CARE_PROVIDER_SITE_OTHER): Payer: 59 | Admitting: Psychology

## 2019-11-25 DIAGNOSIS — F4323 Adjustment disorder with mixed anxiety and depressed mood: Secondary | ICD-10-CM

## 2019-12-09 ENCOUNTER — Ambulatory Visit (INDEPENDENT_AMBULATORY_CARE_PROVIDER_SITE_OTHER): Payer: 59 | Admitting: Psychology

## 2019-12-09 DIAGNOSIS — F4323 Adjustment disorder with mixed anxiety and depressed mood: Secondary | ICD-10-CM | POA: Diagnosis not present

## 2019-12-29 ENCOUNTER — Other Ambulatory Visit: Payer: Self-pay

## 2019-12-29 ENCOUNTER — Ambulatory Visit (INDEPENDENT_AMBULATORY_CARE_PROVIDER_SITE_OTHER): Payer: 59 | Admitting: Sports Medicine

## 2019-12-29 ENCOUNTER — Encounter: Payer: Self-pay | Admitting: Sports Medicine

## 2019-12-29 DIAGNOSIS — M5412 Radiculopathy, cervical region: Secondary | ICD-10-CM

## 2019-12-29 DIAGNOSIS — S069X0S Unspecified intracranial injury without loss of consciousness, sequela: Secondary | ICD-10-CM | POA: Diagnosis not present

## 2019-12-29 MED ORDER — DULOXETINE HCL 30 MG PO CPEP: 30.0000 mg | ORAL_CAPSULE | Freq: Every day | ORAL | 3 refills | Status: DC

## 2019-12-29 NOTE — Addendum Note (Signed)
Addended by: Silverio Decamp on: 12/29/2019 02:03 PM   Modules accepted: Orders

## 2019-12-29 NOTE — Assessment & Plan Note (Signed)
This pleasant 54 year old female also had a head injury, she was hit in the head by her horse approximately 6 years ago. She has had a fairly extensive and appropriate work-up including 2 brain MRIs as well as CT scans, as well as evaluation by neuropsychology. It sounds as though she has significant mental fog and depressive symptoms. She was initially diagnosed with a concussion but this far out the concussion has resolved, I think the majority of her symptoms are likely related to the mental fog that patient can get with uncontrolled depression. We discussed the limitations of more advanced imaging studies and the lack of utility of other studies such as a functional MRI, and I have advised her that we should simply try to treat her depression in the hopes that her mental fog will improve. She has agreed to try Cymbalta at a low dose, with an expected up titration if needed after 4 to 6 weeks. I would like her to follow this up with her PCP as well though she will be seeing me in about 4 to 6 weeks.

## 2019-12-29 NOTE — Assessment & Plan Note (Signed)
This is a pleasant 54 year old female, she has a fairly complex history. She has had surgery in her left shoulder, in the form of an arthroscopic subacromial procedure as well as a biceps tenodesis. Since then she has reported intermittent numbness down into her left middle finger with paresthesias in the entire hand but sparing the pinky. Symptoms are not worse at night but typically occur when rubbing her left deltoid. She also tends to turn her head to the left when this happens. I think this is more of a radicular process, in fact she has C4-C5 DDD on x-rays done about 6 months ago. On exam she has a positive Spurling sign on the left with reproduction of her symptoms. She has not done well with gabapentin in the past. For this reason we are going to add formal physical therapy for her neck, done at [facility] where she is currently getting therapy for her shoulder. She will continue her NSAIDs. Considering duration of symptoms greater than 6 weeks we will proceed with a cervical spine MRI as well. If insufficient improvement after a full 6 weeks we will proceed with cervical epidural +/- the addition of Lyrica.

## 2019-12-29 NOTE — Progress Notes (Addendum)
    Procedures performed today:    None.  Independent interpretation of tests performed by another provider:   Cervical spine x-rays shows C4-C5 DDD  Impression and Recommendations:    Radiculitis of left cervical region This is a pleasant 54 year old female, she has a fairly complex history. She has had surgery in her left shoulder, in the form of an arthroscopic subacromial procedure as well as a biceps tenodesis. Since then she has reported intermittent numbness down into her left middle finger with paresthesias in the entire hand but sparing the pinky. Symptoms are not worse at night but typically occur when rubbing her left deltoid. She also tends to turn her head to the left when this happens. I think this is more of a radicular process, in fact she has C4-C5 DDD on x-rays done about 6 months ago. On exam she has a positive Spurling sign on the left with reproduction of her symptoms. She has not done well with gabapentin in the past. For this reason we are going to add formal physical therapy for her neck, done at [facility] where she is currently getting therapy for her shoulder. She will continue her NSAIDs. Considering duration of symptoms greater than 6 weeks we will proceed with a cervical spine MRI as well. If insufficient improvement after a full 6 weeks we will proceed with cervical epidural +/- the addition of Lyrica.  Historical TBI with chronic depressive symptoms This pleasant 54 year old female also had a head injury, she was hit in the head by her horse approximately 6 years ago. She has had a fairly extensive and appropriate work-up including 2 brain MRIs as well as CT scans, as well as evaluation by neuropsychology. It sounds as though she has significant mental fog and depressive symptoms. She was initially diagnosed with a concussion but this far out the concussion has resolved, I think the majority of her symptoms are likely related to the mental  fog that patient can get with uncontrolled depression. We discussed the limitations of more advanced imaging studies and the lack of utility of other studies such as a functional MRI, and I have advised her that we should simply try to treat her depression in the hopes that her mental fog will improve. She has agreed to try Cymbalta at a low dose, with an expected up titration if needed after 4 to 6 weeks. I would like her to follow this up with her PCP as well though she will be seeing me in about 4 to 6 weeks.    ___________________________________________ Gwen Her. Dianah Field, M.D., ABFM., CAQSM. Primary Care and [place] Instructor of [place] of [facility] of Medicine

## 2019-12-29 NOTE — Patient Instructions (Signed)
Positive Spurling sign on the left.

## 2019-12-30 ENCOUNTER — Ambulatory Visit (INDEPENDENT_AMBULATORY_CARE_PROVIDER_SITE_OTHER): Payer: 59 | Admitting: Psychology

## 2019-12-30 DIAGNOSIS — F4323 Adjustment disorder with mixed anxiety and depressed mood: Secondary | ICD-10-CM | POA: Diagnosis not present

## 2020-01-01 ENCOUNTER — Other Ambulatory Visit: Payer: Self-pay

## 2020-01-01 ENCOUNTER — Ambulatory Visit (INDEPENDENT_AMBULATORY_CARE_PROVIDER_SITE_OTHER): Payer: 59

## 2020-01-01 DIAGNOSIS — M5412 Radiculopathy, cervical region: Secondary | ICD-10-CM

## 2020-01-13 ENCOUNTER — Ambulatory Visit: Payer: 59 | Admitting: Psychology

## 2020-01-21 ENCOUNTER — Other Ambulatory Visit: Payer: Self-pay | Admitting: Sports Medicine

## 2020-01-21 DIAGNOSIS — S069X0S Unspecified intracranial injury without loss of consciousness, sequela: Secondary | ICD-10-CM

## 2020-02-07 ENCOUNTER — Ambulatory Visit (INDEPENDENT_AMBULATORY_CARE_PROVIDER_SITE_OTHER): Payer: 59 | Admitting: Psychology

## 2020-02-07 DIAGNOSIS — F4323 Adjustment disorder with mixed anxiety and depressed mood: Secondary | ICD-10-CM

## 2020-02-09 ENCOUNTER — Other Ambulatory Visit: Payer: Self-pay

## 2020-02-09 ENCOUNTER — Ambulatory Visit (INDEPENDENT_AMBULATORY_CARE_PROVIDER_SITE_OTHER): Payer: 59 | Admitting: Sports Medicine

## 2020-02-09 ENCOUNTER — Encounter: Payer: Self-pay | Admitting: Sports Medicine

## 2020-02-09 DIAGNOSIS — M5412 Radiculopathy, cervical region: Secondary | ICD-10-CM

## 2020-02-09 DIAGNOSIS — S069X0S Unspecified intracranial injury without loss of consciousness, sequela: Secondary | ICD-10-CM | POA: Diagnosis not present

## 2020-02-09 NOTE — Assessment & Plan Note (Signed)
This pleasant 54 year old female returns, she is a complex history of a head injury in the past, hit by her horse 6 years prior. She had had an extensive and appropriate work-up including 2 brain MRIs, CTs as well as evaluation by neuropsychology. She endorsed significant mental fog, depressive symptoms. She was initially diagnosed with a concussion but concussions do not last this far. We had suspected that the symptoms of mental fog will related more to uncontrolled depression and discussed the limitations of advanced imaging and the lack of utility of other studies such as a functional MRI. She finally agreed to let me treat her depression, we added Cymbalta low-dose and she returns today with all of her symptoms almost completely resolved now after 6 years of agony. We are going to leave the Cymbalta dose alone, and she can follow this up with her PCP.

## 2020-02-09 NOTE — Assessment & Plan Note (Signed)
Nicole Waller also had pain in her left shoulder, she had an arthroscopic subacromial procedure as well as biceps tenodesis, she then had intermittent numbness down to her left middle finger with paresthesias in the entire hand. We suspected more of a radicular process, she did not do well with gabapentin. She had thought that some of her symptoms may be coming from an arthroscopic portal site, I advised that this was unlikely, with physical therapy as well as dry needling for her neck and Cymbalta she has now had good improvement of her arm symptoms, and feels as though she can live with it now. At this point we do not need to discuss a cervical epidural or Lyrica. She can return to see me as needed.

## 2020-02-09 NOTE — Progress Notes (Signed)
    Procedures performed today:    None.  Independent interpretation of notes and tests performed by another provider:   None.  Brief History, Exam, Impression, and Recommendations:    Historical TBI with chronic depressive symptoms This pleasant 54 year old female returns, she is a complex history of a head injury in the past, hit by her horse 6 years prior. She had had an extensive and appropriate work-up including 2 brain MRIs, CTs as well as evaluation by neuropsychology. She endorsed significant mental fog, depressive symptoms. She was initially diagnosed with a concussion but concussions do not last this far. We had suspected that the symptoms of mental fog will related more to uncontrolled depression and discussed the limitations of advanced imaging and the lack of utility of other studies such as a functional MRI. She finally agreed to let me treat her depression, we added Cymbalta low-dose and she returns today with all of her symptoms almost completely resolved now after 6 years of agony. We are going to leave the Cymbalta dose alone, and she can follow this up with her PCP.  Radiculitis of left cervical region Ahmani also had pain in her left shoulder, she had an arthroscopic subacromial procedure as well as biceps tenodesis, she then had intermittent numbness down to her left middle finger with paresthesias in the entire hand. We suspected more of a radicular process, she did not do well with gabapentin. She had thought that some of her symptoms may be coming from an arthroscopic portal site, I advised that this was unlikely, with physical therapy as well as dry needling for her neck and Cymbalta she has now had good improvement of her arm symptoms, and feels as though she can live with it now. At this point we do not need to discuss a cervical epidural or Lyrica. She can return to see me as needed.    ___________________________________________ Gwen Her. Dianah Field, M.D.,  ABFM., CAQSM. Primary Care and [place] Instructor of [place] of [facility] of Medicine

## 2020-04-03 ENCOUNTER — Other Ambulatory Visit: Payer: Self-pay | Admitting: Internal Medicine

## 2020-04-10 ENCOUNTER — Other Ambulatory Visit: Payer: Self-pay | Admitting: Sports Medicine

## 2020-04-10 DIAGNOSIS — S069X0S Unspecified intracranial injury without loss of consciousness, sequela: Secondary | ICD-10-CM

## 2020-04-14 ENCOUNTER — Other Ambulatory Visit: Payer: Self-pay | Admitting: Internal Medicine

## 2020-04-14 MED ORDER — MONTELUKAST SODIUM 10 MG PO TABS: ORAL_TABLET | ORAL | 3 refills | Status: DC

## 2020-06-25 ENCOUNTER — Encounter: Payer: Self-pay | Admitting: Internal Medicine

## 2020-06-25 NOTE — Progress Notes (Signed)
Annual Screening/Preventative Visit & Comprehensive Evaluation &  Examination      This very nice 54 y.o.  MWF presents for a Screening /Preventative Visit & comprehensive evaluation and management of multiple medical co-morbidities.  Patient has been followed for labile HTN, HLD, Prediabetes  and Vitamin D Deficiency. Patient relates that she had Lt shoulder surgery by Dr Griffin Basil  in Nov 2020.       Patient has hx/o a concussive TBI Head injury (June 2014) & is followed by Dr Tomi Likens.  In July 2020, she fell off of a horse & had a 2sd concussion &  had a Brain MRI and is followed by Dr Loretta Plume. In Fall 2020 , she was seen by Dr Tamala Julian who did not feel her musculoskeletal c/o were consequent of TBI.   In Feb & March 2021 she was seen by Dr Dianah Field for a 2sd opinion & he likewise felt that her sx's were due to depression and treated her with Duloxetine and her sx's improved per his notes.        Patient has been followed expectantly for elevated BP. Last Aug her BP was borderline at 122/88.   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  Patient's BP has been controlled at home and patient denies any cardiac symptoms as chest pain, palpitations, shortness of breath, dizziness or ankle swelling. Today's BP is at goal - 118/80.  BP Readings from Last 3 Encounters:  08/25/19 129/87  08/11/19 (!) 140/91  08/04/19 104/72        Patient's hyperlipidemia is not controlled with diet.  Her last lipids were not at goal:  Lab Results  Component Value Date   CHOL 206 (H) 06/23/2019   HDL 82 06/23/2019   LDLCALC 103 (H) 06/23/2019   TRIG 111 06/23/2019   CHOLHDL 2.5 06/23/2019        Patient is monitored proactively for glucose intolerance and patient denies reactive hypoglycemic symptoms, visual blurring, diabetic polys or paresthesias. Last A1c was Normal & at goal:  Lab Results  Component Value Date   HGBA1C 5.4 06/23/2019        Finally, patient has history of Vitamin D Deficiency and  last Vitamin D was at goal:  Lab Results  Component Value Date   VD25OH 89.53 07/07/2019    Current Outpatient Medications on File Prior to Visit  Medication Sig  . albuterol (PROAIR HFA) 108 (90 Base) MCG/ACT inhaler Use 2 inhalations 15 minutes apart every 4 hours to Rescue Asthma  . Amphetamine ER (ADZENYS XR-ODT) 9.4 MG TBED Take 1 tablet by mouth daily.  Marland Kitchen aspirin 81 MG tablet Take 81 mg by mouth. Take one pill every 4 days  . B Complex Vitamins (B COMPLEX-B12) TABS Take 1 tablet by mouth daily.  . Calcium 600-200 MG-UNIT per tablet Take 1 tablet by mouth every other day. Every three days  . cetirizine (ZYRTEC) 10 MG tablet Take 10 mg by mouth daily.  . Cholecalciferol (VITAMIN D) 125 MCG (5000 UT) CAPS Take 1 capsule by mouth daily.  . CHOLINE PO Take 1 tablet by mouth daily.  . Coenzyme Q10 (COQ10) 100 MG CAPS Take by mouth daily.  . DULoxetine (CYMBALTA) 30 MG capsule TAKE 1 CAPSULE BY MOUTH EVERY DAY  . Fluticasone-Umeclidin-Vilant (TRELEGY ELLIPTA) 100-62.5-25 MCG/INH AEPB Inhale 1 puff into the lungs daily.  . IMITREX 100 MG tablet TAKE 1 TABLET IMMEDIATELY AT ONSET OF MIGRAINE & MAY REPEAT ONCE IN 2 HOURS (MAX 2 TABS /  24 HOURS).  Marland Kitchen MAGNESIUM GLYCINATE PLUS PO Take 400 mg by mouth 2 (two) times daily.  . montelukast (SINGULAIR) 10 MG tablet Take 1 tablet Daily for Allergies  . Multiple Vitamin (MULTIVITAMIN) capsule Take 1 capsule by mouth daily.    . NON FORMULARY Metro lotion prn for rosecea  . Omega-3 Fatty Acids (FISH OIL) 1000 MG CAPS Take 1,000 mg by mouth daily.  Marland Kitchen OVER THE COUNTER MEDICATION OTC Potassium 2 tablets daily.  . SUMAtriptan (IMITREX) 20 MG/ACT nasal spray May repeat in 2 hours if headache persists or recurs.  . valACYclovir (VALTREX) 500 MG tablet Take 500 mg by mouth daily as needed (for outbreak).   . zinc gluconate 50 MG tablet Take 50 mg by mouth daily.   No current facility-administered medications on file prior to visit.   Allergies  Allergen  Reactions  . Accolate [Zafirlukast]     GI Upset  . Codeine     REACTION: nausea and vomiting  . Latex Rash  . Lyrica [Pregabalin]     Fatigue  . Oxycodone     nauseated  . Probiotic [Acidophilus]     Headache  . Tape     Causes redness, and skin gets raw!   Past Medical History:  Diagnosis Date  . Abdominal pain    in past  . Abnormal chest CT    in past  . Abnormal EKG   . Anxiety    since head injury  . Chronic cough   . Chronic headache   . Concussion    past injury from horse  . Dyspnea   . Exercise-induced asthma   . Head injury 04/2013   due to horse injury/ hit backwards by horse head!  . History of nephrolithiasis   . Hx of migraines   . Post-operative nausea and vomiting   . Pulmonary nodule 2011  . Rosacea    Health Maintenance  Topic Date Due  . Hepatitis C Screening  Never done  . HIV Screening  Never done  . PAP SMEAR-Modifier  Never done  . MAMMOGRAM  Never done  . INFLUENZA VACCINE  06/11/2020  . TETANUS/TDAP  05/13/2027  . COLONOSCOPY  07/09/2027  . COVID-19 Vaccine  Completed   Immunization History  Administered Date(s) Administered  . Influenza Split 08/11/2014, 08/19/2017  . Influenza Whole 08/11/2012  . PFIZER SARS-COV-2 Vaccination 01/18/2020, 02/08/2020  . PPD Test 04/16/2016, 05/12/2017, 05/26/2018, 06/23/2019, 06/26/2020  . Pneumococcal Conjugate-13 10/09/2017  . Pneumococcal Polysaccharide-23 12/04/2017  . Pneumococcal-Unspecified 11/11/2006  . Td 11/11/2006  . Tdap 05/12/2017    Last Colon - 07/08/2017 - Dr Henrene Pastor - Recc 10 yr f/u - due Sept 2028  Last [facility] -  - Dr Sherran Needs office an upcoming visit in Sept 2021  Past Surgical History:  Procedure Laterality Date  . ELBOW SURGERY Right    2012/ tendon and reattachment left arm in 2018  . EYE SURGERY Right    PVD  . FOOT SURGERY Left 1991   bunionectomy  . SHOULDER SURGERY Bilateral    right-2003/left 2005 due to bone spurs   Family History  Problem Relation Age of  Onset  . Colon polyps Father   . Colitis Father   . Heart disease Father   . Hypertension Father   . Hyperlipidemia Father   . Breast cancer Maternal Grandmother   . Cancer Maternal Grandmother 9       Breast,uterine,ovarian  . Colon polyps Mother   . Diabetes Mother   .  Rheum arthritis Mother   . Lupus Mother   . Diabetes Paternal Grandfather   . Hypertension Sister   . Hyperlipidemia Sister    Social History   Tobacco Use  . Smoking status: Never Smoker  . Smokeless tobacco: Never Used  Vaping Use  . Vaping Use: Never used  Substance Use Topics  . Alcohol use: No  . Drug use: No    ROS Constitutional: Denies fever, chills, weight loss/gain, headaches, insomnia,  night sweats, and change in appetite. Does c/o fatigue. Eyes: Denies redness, blurred vision, diplopia, discharge, itchy, watery eyes.  ENT: Denies discharge, congestion, post nasal drip, epistaxis, sore throat, earache, hearing loss, dental pain, Tinnitus, Vertigo, Sinus pain, snoring.  Cardio: Denies chest pain, palpitations, irregular heartbeat, syncope, dyspnea, diaphoresis, orthopnea, PND, claudication, edema Respiratory: denies cough, dyspnea, DOE, pleurisy, hoarseness, laryngitis, wheezing.  Gastrointestinal: Denies dysphagia, heartburn, reflux, water brash, pain, cramps, nausea, vomiting, bloating, diarrhea, constipation, hematemesis, melena, hematochezia, jaundice, hemorrhoids Genitourinary: Denies dysuria, frequency, urgency, nocturia, hesitancy, discharge, hematuria, flank pain Breast: Breast lumps, nipple discharge, bleeding.  Musculoskeletal: Denies arthralgia, myalgia, stiffness, Jt. Swelling, pain, limp, and strain/sprain. Denies falls. Skin: Denies puritis, rash, hives, warts, acne, eczema, changing in skin lesion Neuro: No weakness, tremor, incoordination, spasms, paresthesia, pain Psychiatric: Denies confusion, memory loss, sensory loss. Denies Depression. Endocrine: Denies change in weight, skin,  hair change, nocturia, and paresthesia, diabetic polys, visual blurring, hyper / hypo glycemic episodes.  Heme/Lymph: No excessive bleeding, bruising, enlarged lymph nodes.  Physical Exam  BP 118/80   Pulse 80   Temp 97.6 F (36.4 C)   Resp 16   Ht 5\' 9"  (1.753 m)   Wt 160 lb 3.2 oz (72.7 kg)   BMI 23.66 kg/m   General Appearance: Well nourished, well groomed and in no apparent distress.  Eyes: PERRLA, EOMs, conjunctiva no swelling or erythema, normal fundi and vessels. Sinuses: No frontal/maxillary tenderness ENT/Mouth: EACs patent / TMs  nl. Nares clear without erythema, swelling, mucoid exudates. Oral hygiene is good. No erythema, swelling, or exudate. Tongue normal, non-obstructing. Tonsils not swollen or erythematous. Hearing normal.  Neck: Supple, thyroid not palpable. No bruits, nodes or JVD. Respiratory: Respiratory effort normal.  BS equal and clear bilateral without rales, rhonci, wheezing or stridor. Cardio: Heart sounds are normal with regular rate and rhythm and no murmurs, rubs or gallops. Peripheral pulses are normal and equal bilaterally without edema. No aortic or femoral bruits. Chest: symmetric with normal excursions and percussion. Breasts: Symmetric, without lumps, nipple discharge, retractions, or fibrocystic changes.  Abdomen: Flat, soft with bowel sounds active. Nontender, no guarding, rebound, hernias, masses, or organomegaly.  Lymphatics: Non tender without lymphadenopathy.  Genitourinary:  Musculoskeletal: Full ROM all peripheral extremities, joint stability, 5/5 strength, and normal gait. Skin: Warm and dry without rashes, lesions, cyanosis, clubbing or  ecchymosis.  Neuro: Cranial nerves intact, reflexes equal bilaterally. Normal muscle tone, no cerebellar symptoms. Sensation intact.  Pysch: Alert and oriented X 3, normal affect, Insight and Judgment appropriate.   Assessment and Plan  1. Annual Preventative Screening Examination  2. Labile  hypertension  - EKG 12-Lead - [place], RETROPERITNL ABD,  LTD - Urinalysis, Routine w reflex microscopic - Microalbumin / creatinine urine ratio - CBC with Differential/Platelet - COMPLETE METABOLIC PANEL WITH GFR - Magnesium - TSH  3. Hyperlipidemia, mixed  - EKG 12-Lead - [place], RETROPERITNL ABD,  LTD - Lipid panel - TSH  4. Abnormal glucose  - EKG 12-Lead - [place], RETROPERITNL ABD,  LTD - TSH -  Insulin, random  5. Vitamin D deficiency  - VITAMIN D 25 Hydroxy   6. Screening for colorectal cancer  - POC Hemoccult Bld/Stl   7. Screening examination for pulmonary tuberculosis  - TB Skin Test  8. Screening for ischemic heart disease  - EKG 12-Lead  9. FHx: heart disease  - EKG 12-Lead - [place], RETROPERITNL ABD,  LTD  10. Abnormal EKG  - EKG 12-Lead  11. Screening for AAA (aortic abdominal aneurysm)  - [place], RETROPERITNL ABD,  LTD  12. Fatigue  - Iron,Total/Total Iron Binding Cap - Vitamin B12 - CBC with Differential/Platelet - TSH  13. Medication management  - Urinalysis, Routine w reflex microscopic - Microalbumin / creatinine urine ratio - CBC with Differential/Platelet - COMPLETE METABOLIC PANEL WITH GFR - Magnesium - Lipid panel - TSH - Insulin, random - VITAMIN D 25 Hydroxy         Patient was counseled in prudent diet to achieve/maintain BMI less than 25 for weight control, BP monitoring, regular exercise and medications. Discussed med's effects and SE's. Screening labs and tests as requested with regular follow-up as recommended. Over 40 minutes of exam, counseling, chart review and high complex critical decision making was performed.   Kirtland Bouchard, MD

## 2020-06-25 NOTE — Patient Instructions (Signed)
Due to recent changes in healthcare laws, you may see the results of your imaging and laboratory studies on MyChart before your provider has had a chance to review them.  We understand that in some cases there may be results that are confusing or concerning to you. Not all laboratory results come back in the same time frame and the provider may be waiting for multiple results in order to interpret others.  Please give [place] 48 hours in order for your provider to thoroughly review all the results before contacting the office for clarification of your results.   ++++++++++++++++++++++++++++++  Vit D  & Vit C 1,000 mg   are recommended to help protect  against the Covid-19 and other Corona viruses.    Also it's recommended  to take  Zinc 50 mg  to help  protect against the Covid-19   and best place to get  is also on [email or web address]  and don't pay more than 6-8 cents /pill !  ================================ Coronavirus (COVID-19) Are you at risk?  Are you at risk for the Coronavirus (COVID-19)?  To be considered HIGH RISK for Coronavirus (COVID-19), you have to meet the following criteria:  . Traveled to [place], [place], [place], [place] or [place]; or in the [place] to [place], [place], [place]  . or [place]; and have fever, cough, and shortness of breath within the last 2 weeks of travel OR . Been in close contact with a person diagnosed with COVID-19 within the last 2 weeks and have  . fever, cough,and shortness of breath .  . IF YOU DO NOT MEET THESE CRITERIA, YOU ARE CONSIDERED LOW RISK FOR COVID-19.  What to do if you are HIGH RISK for COVID-19?  Marland Kitchen If you are having a medical emergency, call 911. . Seek medical care right away. Before you go to a doctor's office, urgent care or emergency department, .  call ahead and tell them about your recent travel, contact with someone diagnosed with COVID-19  .  and your symptoms.  . You should receive instructions from your  physician's office regarding next steps of care.  . When you arrive at healthcare provider, tell the healthcare staff immediately you have returned from  . visiting [place], [place], [place], [place] or [place]; or traveled in the [place] to [place], [place],  . [place] or [place] in the last two weeks or you have been in close contact with a person diagnosed with  . COVID-19 in the last 2 weeks.   . Tell the health care staff about your symptoms: fever, cough and shortness of breath. . After you have been seen by a medical provider, you will be either: o Tested for (COVID-19) and discharged home on quarantine except to seek medical care if  o symptoms worsen, and asked to  - Stay home and avoid contact with others until you get your results (4-5 days)  - Avoid travel on public transportation if possible (such as bus, train, or airplane) or o Sent to the Emergency Department by EMS for evaluation, COVID-19 testing  and  o possible admission depending on your condition and test results.  What to do if you are LOW RISK for COVID-19?  Reduce your risk of any infection by using the same precautions used for avoiding the common cold or flu:  Marland Kitchen Wash your hands often with soap and warm water for at least 20 seconds.  If soap and water are not readily  available,  . use an alcohol-based hand sanitizer with at least 60% alcohol.  . If coughing or sneezing, cover your mouth and nose by coughing or sneezing into the elbow areas of your shirt or coat, .  into a tissue or into your sleeve (not your hands). . Avoid shaking hands with others and consider head nods or verbal greetings only. . Avoid touching your eyes, nose, or mouth with unwashed hands.  . Avoid close contact with people who are sick. . Avoid places or events with large numbers of people in one location, like concerts or sporting events. . Carefully consider travel plans you have or are making. . If you are planning any travel  outside or inside the [place], visit the CDC's Travelers' Health webpage for the latest health notices. . If you have some symptoms but not all symptoms, continue to monitor at home and seek medical attention  . if your symptoms worsen. . If you are having a medical emergency, call 911. >>>>>>>>>>>>>>>>>>>>>>> Preventive Care for Adults  A healthy lifestyle and preventive care can promote health and wellness. Preventive health guidelines for women include the following key practices.  A routine yearly physical is a good way to check with your health care provider about your health and preventive screening. It is a chance to share any concerns and updates on your health and to receive a thorough exam.  Visit your dentist for a routine exam and preventive care every 6 months. Brush your teeth twice a day and floss once a day. Good oral hygiene prevents tooth decay and gum disease.  The frequency of eye exams is based on your age, health, family medical history, use of contact lenses, and other factors. Follow your health care provider's recommendations for frequency of eye exams.  Eat a healthy diet. Foods like vegetables, fruits, whole grains, low-fat dairy products, and lean protein foods contain the nutrients you need without too many calories. Decrease your intake of foods high in solid fats, added sugars, and salt. Eat the right amount of calories for you. Get information about a proper diet from your health care provider, if necessary.  Regular physical exercise is one of the most important things you can do for your health. Most adults should get at least 150 minutes of moderate-intensity exercise (any activity that increases your heart rate and causes you to sweat) each week. In addition, most adults need muscle-strengthening exercises on 2 or more days a week.  Maintain a healthy weight. The body mass index (BMI) is a screening tool to identify possible weight problems. It provides an estimate  of body fat based on height and weight. Your health care provider can find your BMI and can help you achieve or maintain a healthy weight. For adults 20 years and older:  A BMI below 18.5 is considered underweight.  A BMI of 18.5 to 24.9 is normal.  A BMI of 25 to 29.9 is considered overweight.  A BMI of 30 and above is considered obese.  Maintain normal blood lipids and cholesterol levels by exercising and minimizing your intake of saturated fat. Eat a balanced diet with plenty of fruit and vegetables. Blood tests for lipids and cholesterol should begin at age 80 and be repeated every 5 years. If your lipid or cholesterol levels are high, you are over 50, or you are at high risk for heart disease, you may need your cholesterol levels checked more frequently. Ongoing high lipid and cholesterol levels should be treated with  medicines if diet and exercise are not working.  If you smoke, find out from your health care provider how to quit. If you do not use tobacco, do not start.  Lung cancer screening is recommended for adults aged 37-80 years who are at high risk for developing lung cancer because of a history of smoking. A yearly low-dose CT scan of the lungs is recommended for people who have at least a 30-pack-year history of smoking and are a current smoker or have quit within the past 15 years. A pack year of smoking is smoking an average of 1 pack of cigarettes a day for 1 year (for example: 1 pack a day for 30 years or 2 packs a day for 15 years). Yearly screening should continue until the smoker has stopped smoking for at least 15 years. Yearly screening should be stopped for people who develop a health problem that would prevent them from having lung cancer treatment.  High blood pressure causes heart disease and increases the risk of stroke. Your blood pressure should be checked at least every 1 to 2 years. Ongoing high blood pressure should be treated with medicines if weight loss and  exercise do not work.  If you are 38-36 years old, ask your health care provider if you should take aspirin to prevent strokes.  Diabetes screening involves taking a blood sample to check your fasting blood sugar level. This should be done once every 3 years, after age 17, if you are within normal weight and without risk factors for diabetes. Testing should be considered at a younger age or be carried out more frequently if you are overweight and have at least 1 risk factor for diabetes.  Breast cancer screening is essential preventive care for women. You should practice "breast self-awareness." This means understanding the normal appearance and feel of your breasts and may include breast self-examination. Any changes detected, no matter how small, should be reported to a health care provider. Women in their 81s and 30s should have a clinical breast exam (CBE) by a health care provider as part of a regular health exam every 1 to 3 years. After age 62, women should have a CBE every year. Starting at age 86, women should consider having a mammogram (breast X-ray test) every year. Women who have a family history of breast cancer should talk to their health care provider about genetic screening. Women at a high risk of breast cancer should talk to their health care providers about having an MRI and a mammogram every year.  Breast cancer gene (BRCA)-related cancer risk assessment is recommended for women who have family members with BRCA-related cancers. BRCA-related cancers include breast, ovarian, tubal, and peritoneal cancers. Having family members with these cancers may be associated with an increased risk for harmful changes (mutations) in the breast cancer genes BRCA1 and BRCA2. Results of the assessment will determine the need for genetic counseling and BRCA1 and BRCA2 testing.  Routine pelvic exams to screen for cancer are no longer recommended for nonpregnant women who are considered low risk for  cancer of the pelvic organs (ovaries, uterus, and vagina) and who do not have symptoms. Ask your health care provider if a screening pelvic exam is right for you.  If you have had past treatment for cervical cancer or a condition that could lead to cancer, you need Pap tests and screening for cancer for at least 20 years after your treatment. If Pap tests have been discontinued, your risk factors (such  as having a new sexual partner) need to be reassessed to determine if screening should be resumed. Some women have medical problems that increase the chance of getting cervical cancer. In these cases, your health care provider may recommend more frequent screening and Pap tests.  Colorectal cancer can be detected and often prevented. Most routine colorectal cancer screening begins at the age of 70 years and continues through age 71 years. However, your health care provider may recommend screening at an earlier age if you have risk factors for colon cancer. On a yearly basis, your health care provider may provide home test kits to check for hidden blood in the stool. Use of a small camera at the end of a tube, to directly examine the colon (sigmoidoscopy or colonoscopy), can detect the earliest forms of colorectal cancer. Talk to your health care provider about this at age 68, when routine screening begins.  Direct exam of the colon should be repeated every 5-10 years through age 38 years, unless early forms of pre-cancerous polyps or small growths are found.  Hepatitis C blood testing is recommended for all people born from [birth year] through 1965 and any individual with known risks for hepatitis C.  Pra  Osteoporosis is a disease in which the bones lose minerals and strength with aging. This can result in serious bone fractures or breaks. The risk of osteoporosis can be identified using a bone density scan. Women ages 55 years and over and women at risk for fractures or osteoporosis should discuss screening with  their health care providers. Ask your health care provider whether you should take a calcium supplement or vitamin D to reduce the rate of osteoporosis.  Menopause can be associated with physical symptoms and risks. Hormone replacement therapy is available to decrease symptoms and risks. You should talk to your health care provider about whether hormone replacement therapy is right for you.  Use sunscreen. Apply sunscreen liberally and repeatedly throughout the day. You should seek shade when your shadow is shorter than you. Protect yourself by wearing long sleeves, pants, a wide-brimmed hat, and sunglasses year round, whenever you are outdoors.  Once a month, do a whole body skin exam, using a mirror to look at the skin on your back. Tell your health care provider of new moles, moles that have irregular borders, moles that are larger than a pencil eraser, or moles that have changed in shape or color.  Stay current with required vaccines (immunizations).  Influenza vaccine. All adults should be immunized every year.  Tetanus, diphtheria, and acellular pertussis (Td, Tdap) vaccine. Pregnant women should receive 1 dose of Tdap vaccine during each pregnancy. The dose should be obtained regardless of the length of time since the last dose. Immunization is preferred during the 27th-36th week of gestation. An adult who has not previously received Tdap or who does not know her vaccine status should receive 1 dose of Tdap. This initial dose should be followed by tetanus and diphtheria toxoids (Td) booster doses every 10 years. Adults with an unknown or incomplete history of completing a 3-dose immunization series with Td-containing vaccines should begin or complete a primary immunization series including a Tdap dose. Adults should receive a Td booster every 10 years.  Varicella vaccine. An adult without evidence of immunity to varicella should receive 2 doses or a second dose if she has previously received 1  dose. Pregnant females who do not have evidence of immunity should receive the first dose after pregnancy. This first dose  should be obtained before leaving the health care facility. The second dose should be obtained 4-8 weeks after the first dose.  Human papillomavirus (HPV) vaccine. Females aged 13-26 years who have not received the vaccine previously should obtain the 3-dose series. The vaccine is not recommended for use in pregnant females. However, pregnancy testing is not needed before receiving a dose. If a female is found to be pregnant after receiving a dose, no treatment is needed. In that case, the remaining doses should be delayed until after the pregnancy. Immunization is recommended for any person with an immunocompromised condition through the age of 32 years if she did not get any or all doses earlier. During the 3-dose series, the second dose should be obtained 4-8 weeks after the first dose. The third dose should be obtained 24 weeks after the first dose and 16 weeks after the second dose.  Zoster vaccine. One dose is recommended for adults aged 4 years or older unless certain conditions are present.  Measles, mumps, and rubella (MMR) vaccine. Adults born before [birth year] generally are considered immune to measles and mumps. Adults born in [birth year] or later should have 1 or more doses of MMR vaccine unless there is a contraindication to the vaccine or there is laboratory evidence of immunity to each of the three diseases. A routine second dose of MMR vaccine should be obtained at least 28 days after the first dose for students attending postsecondary schools, health care workers, or international travelers. People who received inactivated measles vaccine or an unknown type of measles vaccine during 1963-1967 should receive 2 doses of MMR vaccine. People who received inactivated mumps vaccine or an unknown type of mumps vaccine before 1979 and are at high risk for mumps infection should consider  immunization with 2 doses of MMR vaccine. For females of childbearing age, rubella immunity should be determined. If there is no evidence of immunity, females who are not pregnant should be vaccinated. If there is no evidence of immunity, females who are pregnant should delay immunization until after pregnancy. Unvaccinated health care workers born before [birth year] who lack laboratory evidence of measles, mumps, or rubella immunity or laboratory confirmation of disease should consider measles and mumps immunization with 2 doses of MMR vaccine or rubella immunization with 1 dose of MMR vaccine.  Pneumococcal 13-valent conjugate (PCV13) vaccine. When indicated, a person who is uncertain of her immunization history and has no record of immunization should receive the PCV13 vaccine. An adult aged 77 years or older who has certain medical conditions and has not been previously immunized should receive 1 dose of PCV13 vaccine. This PCV13 should be followed with a dose of pneumococcal polysaccharide (PPSV23) vaccine. The PPSV23 vaccine dose should be obtained at least 1 or more year(s) after the dose of PCV13 vaccine. An adult aged 29 years or older who has certain medical conditions and previously received 1 or more doses of PPSV23 vaccine should receive 1 dose of PCV13. The PCV13 vaccine dose should be obtained 1 or more years after the last PPSV23 vaccine dose.    Pneumococcal polysaccharide (PPSV23) vaccine. When PCV13 is also indicated, PCV13 should be obtained first. All adults aged 65 years and older should be immunized. An adult younger than age 35 years who has certain medical conditions should be immunized. Any person who resides in a nursing home or long-term care facility should be immunized. An adult smoker should be immunized. People with an immunocompromised condition and certain other conditions should receive both  PCV13 and PPSV23 vaccines. People with human immunodeficiency virus (HIV) infection should  be immunized as soon as possible after diagnosis. Immunization during chemotherapy or radiation therapy should be avoided. Routine use of PPSV23 vaccine is not recommended for American Indians, [place] Natives, or people younger than 65 years unless there are medical conditions that require PPSV23 vaccine. When indicated, people who have unknown immunization and have no record of immunization should receive PPSV23 vaccine. One-time revaccination 5 years after the first dose of PPSV23 is recommended for people aged 19-64 years who have chronic kidney failure, nephrotic syndrome, asplenia, or immunocompromised conditions. People who received 1-2 doses of PPSV23 before age 19 years should receive another dose of PPSV23 vaccine at age 58 years or later if at least 5 years have passed since the previous dose. Doses of PPSV23 are not needed for people immunized with PPSV23 at or after age 106 years.  Preventive Services / Frequency   Ages 28 to 7 years  Blood pressure check.  Lipid and cholesterol check.  Lung cancer screening. / Every year if you are aged 67-80 years and have a 30-pack-year history of smoking and currently smoke or have quit within the past 15 years. Yearly screening is stopped once you have quit smoking for at least 15 years or develop a health problem that would prevent you from having lung cancer treatment.  Clinical breast exam.** / Every year after age 76 years.   BRCA-related cancer risk assessment.** / For women who have family members with a BRCA-related cancer (breast, ovarian, tubal, or peritoneal cancers).  Mammogram.** / Every year beginning at age 64 years and continuing for as long as you are in good health. Consult with your health care provider.  Pap test.** / Every 3 years starting at age 85 years through age 23 or 29 years with a history of 3 consecutive normal Pap tests.  HPV screening.** / Every 3 years from ages 28 years through ages 18 to 42 years with a history  of 3 consecutive normal Pap tests.  Fecal occult blood test (FOBT) of stool. / Every year beginning at age 44 years and continuing until age 44 years. You may not need to do this test if you get a colonoscopy every 10 years.  Flexible sigmoidoscopy or colonoscopy.** / Every 5 years for a flexible sigmoidoscopy or every 10 years for a colonoscopy beginning at age 79 years and continuing until age 76 years.  Hepatitis C blood test.** / For all people born from [birth year] through 1965 and any individual with known risks for hepatitis C.  Skin self-exam. / Monthly.  Influenza vaccine. / Every year.  Tetanus, diphtheria, and acellular pertussis (Tdap/Td) vaccine.** / Consult your health care provider. Pregnant women should receive 1 dose of Tdap vaccine during each pregnancy. 1 dose of Td every 10 years.  Varicella vaccine.** / Consult your health care provider. Pregnant females who do not have evidence of immunity should receive the first dose after pregnancy.  Zoster vaccine.** / 1 dose for adults aged 32 years or older.  Pneumococcal 13-valent conjugate (PCV13) vaccine.** / Consult your health care provider.  Pneumococcal polysaccharide (PPSV23) vaccine.** / 1 to 2 doses if you smoke cigarettes or if you have certain conditions.  Meningococcal vaccine.** / Consult your health care provider.  Hepatitis A vaccine.** / Consult your health care provider.  Hepatitis B vaccine.** / Consult your health care provider. Screening for abdominal aortic aneurysm (AAA)  by ultrasound is recommended for people over 50  who have history of high blood pressure or who are current or former smokers. ++++++++++++++++++ Recommend Adult Low Dose Aspirin or  coated  Aspirin 81 mg daily  To reduce risk of Colon Cancer 40 %,  Skin Cancer 26 % ,  Melanoma 46%  and  Pancreatic cancer 60% +++++++++++++++++++ Vitamin D goal  is between 70-100.  Please make sure that you are taking your Vitamin D as directed.  It  is very important as a natural anti-inflammatory  helping hair, skin, and nails, as well as reducing stroke and heart attack risk.  It helps your bones and helps with mood. It also decreases numerous cancer risks so please take it as directed.  Low Vit D is associated with a 200-300% higher risk for CANCER  and 200-300% higher risk for HEART   ATTACK  &  STROKE.   .....................................Marland Kitchen It is also associated with higher death rate at younger ages,  autoimmune diseases like Rheumatoid arthritis, Lupus, Multiple Sclerosis.    Also many other serious conditions, like depression, Alzheimer's Dementia, infertility, muscle aches, fatigue, fibromyalgia - just to name a few. ++++++++++++++++++ Recommend the book "The END of DIETING" by Dr Excell Seltzer  & the book "The END of DIABETES " by Dr Excell Seltzer At [email or web address] - get book & Audio CD's    Being diabetic has a  300% increased risk for heart attack, stroke, cancer, and alzheimer- type vascular dementia. It is very important that you work harder with diet by avoiding all foods that are white. Avoid white rice (brown & wild rice is OK), white potatoes (sweetpotatoes in moderation is OK), White bread or wheat bread or anything made out of white flour like bagels, donuts, rolls, buns, biscuits, cakes, pastries, cookies, pizza crust, and pasta (made from white flour & egg whites) - vegetarian pasta or spinach or wheat pasta is OK. Multigrain breads like Arnold's or [employer], or multigrain sandwich thins or flatbreads.  Diet, exercise and weight loss can reverse and cure diabetes in the early stages.  Diet, exercise and weight loss is very important in the control and prevention of complications of diabetes which affects every system in your body, ie. Brain - dementia/stroke, eyes - glaucoma/blindness, heart - heart attack/heart failure, kidneys - dialysis, stomach - gastric paralysis, intestines - malabsorption, nerves - severe painful  neuritis, circulation - gangrene & loss of a leg(s), and finally cancer and Alzheimers.    I recommend avoid fried & greasy foods,  sweets/candy, white rice (brown or wild rice or Quinoa is OK), white potatoes (sweet potatoes are OK) - anything made from white flour - bagels, doughnuts, rolls, buns, biscuits,white and wheat breads, pizza crust and traditional pasta made of white flour & egg white(vegetarian pasta or spinach or wheat pasta is OK).  Multi-grain bread is OK - like multi-grain flat bread or sandwich thins. Avoid alcohol in excess. Exercise is also important.    Eat all the vegetables you want - avoid meat, especially red meat and dairy - especially cheese.  Cheese is the most concentrated form of trans-fats which is the worst thing to clog up our arteries. Veggie cheese is OK which can be found in the fresh produce section at [employer] or [employer] or [employer]  ++++++++++++++++++++++ DASH Eating Plan  DASH stands for "Dietary Approaches to Stop Hypertension."   The DASH eating plan is a healthy eating plan that has been shown to reduce high blood pressure (hypertension). Additional health benefits may include reducing the  risk of type 2 diabetes mellitus, heart disease, and stroke. The DASH eating plan may also help with weight loss. WHAT DO I NEED TO KNOW ABOUT THE DASH EATING PLAN? For the DASH eating plan, you will follow these general guidelines:  Choose foods with a percent daily value for sodium of less than 5% (as listed on the food label).  Use salt-free seasonings or herbs instead of table salt or sea salt.  Check with your health care provider or pharmacist before using salt substitutes.  Eat lower-sodium products, often labeled as "lower sodium" or "no salt added."  Eat fresh foods.  Eat more vegetables, fruits, and low-fat dairy products.  Choose whole grains. Look for the word "whole" as the first word in the ingredient list.  Choose fish   Limit  sweets, desserts, sugars, and sugary drinks.  Choose heart-healthy fats.  Eat veggie cheese   Eat more home-cooked food and less restaurant, buffet, and fast food.  Limit fried foods.  Cook foods using methods other than frying.  Limit canned vegetables. If you do use them, rinse them well to decrease the sodium.  When eating at a restaurant, ask that your food be prepared with less salt, or no salt if possible.                      WHAT FOODS CAN I EAT? Read Dr Fara Olden Fuhrman's books on The End of Dieting & The End of Diabetes  Grains Whole grain or whole wheat bread. Brown rice. Whole grain or whole wheat pasta. Quinoa, bulgur, and whole grain cereals. Low-sodium cereals. Corn or whole wheat flour tortillas. Whole grain cornbread. Whole grain crackers. Low-sodium crackers.  Vegetables Fresh or frozen vegetables (raw, steamed, roasted, or grilled). Low-sodium or reduced-sodium tomato and vegetable juices. Low-sodium or reduced-sodium tomato sauce and paste. Low-sodium or reduced-sodium canned vegetables.   Fruits All fresh, canned (in natural juice), or frozen fruits.  Protein Products  All fish and seafood.  Dried beans, peas, or lentils. Unsalted nuts and seeds. Unsalted canned beans.  Dairy Low-fat dairy products, such as skim or 1% milk, 2% or reduced-fat cheeses, low-fat ricotta or cottage cheese, or plain low-fat yogurt. Low-sodium or reduced-sodium cheeses.  Fats and Oils Tub margarines without trans fats. Light or reduced-fat mayonnaise and salad dressings (reduced sodium). Avocado. Safflower, olive, or canola oils. Natural peanut or almond butter.  Other Unsalted popcorn and pretzels. The items listed above may not be a complete list of recommended foods or beverages. Contact your dietitian for more options.  ++++++++++++++++++  WHAT FOODS ARE NOT RECOMMENDED? Grains/ White flour or wheat flour White bread. White pasta. White rice. Refined cornbread. Bagels and  croissants. Crackers that contain trans fat.  Vegetables  Creamed or fried vegetables. Vegetables in a . Regular canned vegetables. Regular canned tomato sauce and paste. Regular tomato and vegetable juices.  Fruits Dried fruits. Canned fruit in light or heavy syrup. Fruit juice.  Meat and Other Protein Products Meat in general - RED meat & White meat.  Fatty cuts of meat. Ribs, chicken wings, all processed meats as bacon, sausage, bologna, salami, fatback, hot dogs, bratwurst and packaged luncheon meats.  Dairy Whole or 2% milk, cream, half-and-half, and cream cheese. Whole-fat or sweetened yogurt. Full-fat cheeses or blue cheese. Non-dairy creamers and whipped toppings. Processed cheese, cheese spreads, or cheese curds.  Condiments Onion and garlic salt, seasoned salt, table salt, and sea salt. Canned and packaged gravies. Worcestershire sauce. Tartar sauce.  Barbecue sauce. Teriyaki sauce. Soy sauce, including reduced sodium. Steak sauce. Fish sauce. Oyster sauce. Cocktail sauce. Horseradish. Ketchup and mustard. Meat flavorings and tenderizers. Bouillon cubes. Hot sauce. Tabasco sauce. Marinades. Taco seasonings. Relishes.  Fats and Oils Butter, stick margarine, lard, shortening and bacon fat. Coconut, palm kernel, or palm oils. Regular salad dressings.  Pickles and olives. Salted popcorn and pretzels.  The items listed above may not be a complete list of foods and beverages to avoid.

## 2020-06-26 ENCOUNTER — Ambulatory Visit (INDEPENDENT_AMBULATORY_CARE_PROVIDER_SITE_OTHER): Payer: 59 | Admitting: Internal Medicine

## 2020-06-26 ENCOUNTER — Other Ambulatory Visit: Payer: Self-pay

## 2020-06-26 VITALS — BP 118/80 | HR 80 | Temp 97.6°F | Resp 16 | Ht 69.0 in | Wt 160.2 lb

## 2020-06-26 DIAGNOSIS — Z8249 Family history of ischemic heart disease and other diseases of the circulatory system: Secondary | ICD-10-CM

## 2020-06-26 DIAGNOSIS — Z0000 Encounter for general adult medical examination without abnormal findings: Secondary | ICD-10-CM

## 2020-06-26 DIAGNOSIS — R0989 Other specified symptoms and signs involving the circulatory and respiratory systems: Secondary | ICD-10-CM | POA: Diagnosis not present

## 2020-06-26 DIAGNOSIS — Z111 Encounter for screening for respiratory tuberculosis: Secondary | ICD-10-CM

## 2020-06-26 DIAGNOSIS — Z136 Encounter for screening for cardiovascular disorders: Secondary | ICD-10-CM | POA: Diagnosis not present

## 2020-06-26 DIAGNOSIS — Z0001 Encounter for general adult medical examination with abnormal findings: Secondary | ICD-10-CM

## 2020-06-26 DIAGNOSIS — E559 Vitamin D deficiency, unspecified: Secondary | ICD-10-CM

## 2020-06-26 DIAGNOSIS — R7309 Other abnormal glucose: Secondary | ICD-10-CM

## 2020-06-26 DIAGNOSIS — E782 Mixed hyperlipidemia: Secondary | ICD-10-CM

## 2020-06-26 DIAGNOSIS — R9431 Abnormal electrocardiogram [ECG] [EKG]: Secondary | ICD-10-CM

## 2020-06-26 DIAGNOSIS — Z79899 Other long term (current) drug therapy: Secondary | ICD-10-CM

## 2020-06-26 DIAGNOSIS — R5383 Other fatigue: Secondary | ICD-10-CM

## 2020-06-26 DIAGNOSIS — Z1211 Encounter for screening for malignant neoplasm of colon: Secondary | ICD-10-CM

## 2020-06-27 LAB — CBC WITH DIFFERENTIAL/PLATELET
Absolute Monocytes: 440 cells/uL (ref 200–950)
Basophils Absolute: 71 cells/uL (ref 0–200)
Basophils Relative: 1 %
Eosinophils Absolute: 220 cells/uL (ref 15–500)
Eosinophils Relative: 3.1 %
HCT: 40 % (ref 35.0–45.0)
Hemoglobin: 13.6 g/dL (ref 11.7–15.5)
Lymphs Abs: 1335 cells/uL (ref 850–3900)
MCH: 34.8 pg — ABNORMAL HIGH (ref 27.0–33.0)
MCHC: 34 g/dL (ref 32.0–36.0)
MCV: 102.3 fL — ABNORMAL HIGH (ref 80.0–100.0)
MPV: 10.3 fL (ref 7.5–12.5)
Monocytes Relative: 6.2 %
Neutro Abs: 5034 cells/uL (ref 1500–7800)
Neutrophils Relative %: 70.9 %
Platelets: 302 10*3/uL (ref 140–400)
RBC: 3.91 10*6/uL (ref 3.80–5.10)
RDW: 11.9 % (ref 11.0–15.0)
Total Lymphocyte: 18.8 %
WBC: 7.1 10*3/uL (ref 3.8–10.8)

## 2020-06-27 LAB — IRON, TOTAL/TOTAL IRON BINDING CAP
%SAT: 36 % (calc) (ref 16–45)
Iron: 139 ug/dL (ref 45–160)
TIBC: 384 mcg/dL (calc) (ref 250–450)

## 2020-06-27 LAB — COMPLETE METABOLIC PANEL WITH GFR
AG Ratio: 1.5 (calc) (ref 1.0–2.5)
ALT: 15 U/L (ref 6–29)
AST: 16 U/L (ref 10–35)
Albumin: 3.9 g/dL (ref 3.6–5.1)
Alkaline phosphatase (APISO): 88 U/L (ref 37–153)
BUN: 14 mg/dL (ref 7–25)
CO2: 27 mmol/L (ref 20–32)
Calcium: 9.1 mg/dL (ref 8.6–10.4)
Chloride: 105 mmol/L (ref 98–110)
Creat: 0.88 mg/dL (ref 0.50–1.05)
GFR, Est African American: 87 mL/min/{1.73_m2} (ref >=60)
GFR, Est Non African American: 75 mL/min/{1.73_m2} (ref >=60)
Globulin: 2.6 g/dL (calc) (ref 1.9–3.7)
Glucose, Bld: 83 mg/dL (ref 65–99)
Potassium: 4.5 mmol/L (ref 3.5–5.3)
Sodium: 138 mmol/L (ref 135–146)
Total Bilirubin: 0.3 mg/dL (ref 0.2–1.2)
Total Protein: 6.5 g/dL (ref 6.1–8.1)

## 2020-06-27 LAB — LIPID PANEL
Cholesterol: 171 mg/dL (ref <200)
HDL: 72 mg/dL (ref >=50)
LDL Cholesterol (Calc): 81 mg/dL (calc)
Non-HDL Cholesterol (Calc): 99 mg/dL (calc) (ref <130)
Total CHOL/HDL Ratio: 2.4 (calc) (ref <5.0)
Triglycerides: 93 mg/dL (ref <150)

## 2020-06-27 LAB — URINALYSIS, ROUTINE W REFLEX MICROSCOPIC
Bilirubin Urine: NEGATIVE
Glucose, UA: NEGATIVE
Hgb urine dipstick: NEGATIVE
Ketones, ur: NEGATIVE
Leukocytes,Ua: NEGATIVE
Nitrite: NEGATIVE
Protein, ur: NEGATIVE
Specific Gravity, Urine: 1.016 (ref 1.001–1.03)
pH: 6.5 (ref 5.0–8.0)

## 2020-06-27 LAB — VITAMIN B12: Vitamin B-12: 426 pg/mL (ref 200–1100)

## 2020-06-27 LAB — MICROALBUMIN / CREATININE URINE RATIO
Creatinine, Urine: 73 mg/dL (ref 20–275)
Microalb Creat Ratio: 7 mcg/mg creat (ref <30)
Microalb, Ur: 0.5 mg/dL

## 2020-06-27 LAB — VITAMIN D 25 HYDROXY (VIT D DEFICIENCY, FRACTURES): Vit D, 25-Hydroxy: 80 ng/mL (ref 30–100)

## 2020-06-27 LAB — TSH: TSH: 2.72 mIU/L

## 2020-06-27 LAB — INSULIN, RANDOM: Insulin: 9.2 u[IU]/mL

## 2020-06-27 LAB — MAGNESIUM: Magnesium: 2.2 mg/dL (ref 1.5–2.5)

## 2020-06-27 NOTE — Progress Notes (Signed)
===========================================================  -   Vitamin B12 - Normal & OK  ===========================================================  - Total Chol slightly elevated at 206 - is OK since have a very high HDL level or 82 !   - LDL is a little too high  (Ideal or goal is less than 70 )  - So...................  - Recommend la stricter low cholesterol diet   - Cholesterol only comes from animal sources  - ie. meat, dairy, egg yolks  - Eat all the vegetables you want.  - Avoid meat, especially red meat - Beef AND Pork .  - Avoid cheese & dairy - milk & ice cream.     - Cheese is the most concentrated form of trans-fats which  is the worst thing to clog up our arteries.   - Veggie cheese is OK which can be found in the fresh  produce section at [employer] or [employer] or [employer] ===========================================================  - Vitamin D = 80 - Excellent  ===========================================================  - All Else - CBC - Kidneys - Electrolytes - Liver - Magnesium & Thyroid    - all  Normal / OK ===========================================================

## 2020-07-31 ENCOUNTER — Other Ambulatory Visit: Payer: Self-pay | Admitting: Internal Medicine

## 2020-08-02 ENCOUNTER — Other Ambulatory Visit: Payer: Self-pay | Admitting: Obstetrics and Gynecology

## 2020-08-02 DIAGNOSIS — Z800 Family history of malignant neoplasm of digestive organs: Secondary | ICD-10-CM

## 2020-08-09 ENCOUNTER — Other Ambulatory Visit: Payer: Self-pay

## 2020-08-09 ENCOUNTER — Ambulatory Visit
Admission: RE | Admit: 2020-08-09 | Discharge: 2020-08-09 | Disposition: A | Discharge status: Home / Self Care | Payer: No Typology Code available for payment source | Source: Ambulatory Visit | Attending: Obstetrics and Gynecology | Admitting: Obstetrics and Gynecology

## 2020-08-09 DIAGNOSIS — Z800 Family history of malignant neoplasm of digestive organs: Secondary | ICD-10-CM

## 2020-08-09 MED ORDER — GADOBENATE DIMEGLUMINE 529 MG/ML IV SOLN
10.0000 mL | Freq: Once | INTRAVENOUS | Status: AC | PRN
  Administered 2020-08-09: 10 mL via INTRAVENOUS

## 2020-08-10 NOTE — Progress Notes (Deleted)
Office Visit Note  Patient: Nicole Waller             Date of Birth: 06-Jul-1966           MRN: [id number]             PCP: Unk Pinto, MD Referring: Unk Pinto, MD Visit Date: 08/23/2020 Occupation: @GUAROCC @  Subjective:  No chief complaint on file.   History of Present Illness: Nicole Waller is a 54 y.o. female ***   Activities of Daily Living:  Patient reports morning stiffness for *** {minute/hour:19697}.   Patient {ACTIONS;DENIES/REPORTS:21021675::"Denies"} nocturnal pain.  Difficulty dressing/grooming: {ACTIONS;DENIES/REPORTS:21021675::"Denies"} Difficulty climbing stairs: {ACTIONS;DENIES/REPORTS:21021675::"Denies"} Difficulty getting out of chair: {ACTIONS;DENIES/REPORTS:21021675::"Denies"} Difficulty using hands for taps, buttons, cutlery, and/or writing: {ACTIONS;DENIES/REPORTS:21021675::"Denies"}  No Rheumatology ROS completed.   PMFS History:  Patient Active Problem List   Diagnosis Date Noted  . Radiculitis of left cervical region 12/29/2019  . Positive ANA (antinuclear antibody) 08/05/2019  . Elevated testosterone level 08/05/2019  . Scapular dyskinesis 07/07/2019  . Benign neoplasm of descending colon 07/08/2017  . Allergic asthma 06/11/2017  . Elevated BP, Screening 04/16/2016  . Elevated cholesterol, screening 04/16/2016  . Other abnormal glucose, screening 04/16/2016  . Historical TBI with chronic depressive symptoms 04/03/2015  . Vitamin D deficiency 04/02/2015  . Medication management 04/02/2015  . CKD Stage 2 (GFR 72 ml/min) 04/02/2015  . Allergic rhinitis 04/02/2015  . Anxiety   . Hx of migraines   . Rosacea   . History of nephrolithiasis   . Cough variant asthma 12/19/2010  . PULMONARY NODULE 06/27/2010  . EPIGASTRIC PAIN 12/25/2009  . ABNORMAL EKG 05/10/2009    Past Medical History:  Diagnosis Date  . Abdominal pain    in past  . Abnormal chest CT    in past  . Abnormal EKG   . Anxiety    since head injury  . Chronic  cough   . Chronic headache   . Concussion    past injury from horse  . Dyspnea   . Exercise-induced asthma   . Head injury 04/2013   due to horse injury/ hit backwards by horse head!  . History of nephrolithiasis   . Hx of migraines   . Post-operative nausea and vomiting   . Pulmonary nodule 2011  . Rosacea     Family History  Problem Relation Age of Onset  . Colon polyps Father   . Colitis Father   . Heart disease Father   . Hypertension Father   . Hyperlipidemia Father   . Breast cancer Maternal Grandmother   . Cancer Maternal Grandmother 93       Breast,uterine,ovarian  . Colon polyps Mother   . Diabetes Mother   . Rheum arthritis Mother   . Lupus Mother   . Diabetes Paternal Grandfather   . Hypertension Sister   . Hyperlipidemia Sister    Past Surgical History:  Procedure Laterality Date  . ELBOW SURGERY Right    2012/ tendon and reattachment left arm in 2018  . EYE SURGERY Right    PVD  . FOOT SURGERY Left 1991   bunionectomy  . SHOULDER SURGERY Bilateral    right-2003/left 2005 due to bone spurs   Social History   Social History Narrative  . Not on file   Immunization History  Administered Date(s) Administered  . Influenza Split 08/11/2014, 08/19/2017  . Influenza Whole 08/11/2012  . PFIZER SARS-COV-2 Vaccination 01/18/2020, 02/08/2020  . PPD Test 04/16/2016, 05/12/2017, 05/26/2018, 06/23/2019, 06/26/2020  .  Pneumococcal Conjugate-13 10/09/2017  . Pneumococcal Polysaccharide-23 12/04/2017  . Pneumococcal-Unspecified 11/11/2006  . Td 11/11/2006  . Tdap 05/12/2017     Objective: Vital Signs: There were no vitals taken for this visit.   Physical Exam   Musculoskeletal Exam: ***  CDAI Exam: CDAI Score: -- Patient Global: --; Provider Global: -- Swollen: --; Tender: -- Joint Exam 08/23/2020   No joint exam has been documented for this visit   There is currently no information documented on the homunculus. Go to the Rheumatology activity and  complete the homunculus joint exam.  Investigation: No additional findings.  Imaging: MR BREAST W & WO CM SCREENING (GI)  Result Date: 08/10/2020 CLINICAL DATA:  Abbreviated Breast MRI for breast cancer screening. LABS:  None obtained at the time of imaging. EXAM: BILATERAL ABBREVIATED BREAST MRI WITH AND WITHOUT CONTRAST TECHNIQUE: Multiplanar, multisequence MR images of both breasts were obtained prior to and following the intravenous administration of 10 ml of Gadavist COMPARISON:  06/23/2018 FINDINGS: Breast composition: c. Heterogeneous fibroglandular tissue. Background parenchymal enhancement: Minimal Right breast: No mass or abnormal enhancement. Left breast: No mass or abnormal enhancement. Lymph nodes: No abnormal appearing lymph nodes. Ancillary findings:  Note is made of pectus excavatum. IMPRESSION: No MRI evidence for malignancy. RECOMMENDATION: Recommend annual screening mammography. The patient is eligible for annual Abbreviated Breast MRI. BI-RADS CATEGORY  1: Negative. Electronically Signed   By: Nolon Nations M.D.   On: 08/10/2020 10:23    Recent Labs: Lab Results  Component Value Date   WBC 7.1 06/26/2020   HGB 13.6 06/26/2020   PLT 302 06/26/2020   NA 138 06/26/2020   K 4.5 06/26/2020   CL 105 06/26/2020   CO2 27 06/26/2020   GLUCOSE 83 06/26/2020   BUN 14 06/26/2020   CREATININE 0.88 06/26/2020   BILITOT 0.3 06/26/2020   ALKPHOS 75 07/07/2019   AST 16 06/26/2020   ALT 15 06/26/2020   PROT 6.5 06/26/2020   ALBUMIN 4.1 07/07/2019   CALCIUM 9.1 06/26/2020   GFRAA 87 06/26/2020    Speciality Comments: No specialty comments available.  Procedures:  No procedures performed Allergies: Accolate [zafirlukast], Codeine, Latex, Lyrica [pregabalin], Oxycodone, Probiotic [acidophilus], and Tape   Assessment / Plan:     Visit Diagnoses: No diagnosis found.  Orders: No orders of the defined types were placed in this encounter.  No orders of the defined types  were placed in this encounter.   Face-to-face time spent with patient was *** minutes. Greater than 50% of time was spent in counseling and coordination of care.  Follow-Up Instructions: No follow-ups on file.   Earnestine Mealing, CMA  Note - This record has been created using Editor, commissioning.  Chart creation errors have been sought, but may not always  have been located. Such creation errors do not reflect on  the standard of medical care.

## 2020-08-23 ENCOUNTER — Ambulatory Visit: Payer: 59 | Admitting: Rheumatology

## 2020-09-13 ENCOUNTER — Other Ambulatory Visit: Payer: Self-pay | Admitting: Internal Medicine

## 2020-09-13 DIAGNOSIS — J4520 Mild intermittent asthma, uncomplicated: Secondary | ICD-10-CM

## 2020-12-05 ENCOUNTER — Other Ambulatory Visit: Payer: Self-pay | Admitting: Internal Medicine

## 2021-04-10 ENCOUNTER — Other Ambulatory Visit: Payer: Self-pay | Admitting: Internal Medicine

## 2021-05-17 ENCOUNTER — Encounter: Payer: Self-pay | Admitting: Internal Medicine

## 2021-05-30 ENCOUNTER — Other Ambulatory Visit: Payer: Self-pay | Admitting: Internal Medicine

## 2021-05-30 MED ORDER — DEXAMETHASONE 4 MG PO TABS: ORAL_TABLET | ORAL | 0 refills | Status: DC

## 2021-06-03 ENCOUNTER — Other Ambulatory Visit: Payer: Self-pay | Admitting: Internal Medicine

## 2021-06-03 DIAGNOSIS — R1084 Generalized abdominal pain: Secondary | ICD-10-CM

## 2021-06-05 ENCOUNTER — Other Ambulatory Visit: Payer: 59

## 2021-06-06 ENCOUNTER — Other Ambulatory Visit: Payer: 59

## 2021-06-06 ENCOUNTER — Ambulatory Visit: Payer: 59

## 2021-06-07 ENCOUNTER — Other Ambulatory Visit: Payer: 59

## 2021-06-07 ENCOUNTER — Other Ambulatory Visit: Payer: Self-pay

## 2021-06-07 DIAGNOSIS — R1084 Generalized abdominal pain: Secondary | ICD-10-CM

## 2021-06-08 LAB — CELIAC DISEASE COMPREHENSIVE PANEL WITH REFLEXES
(tTG) Ab, IgA: <1 U/mL
Immunoglobulin A: 75 mg/dL (ref 47–310)

## 2021-06-09 NOTE — Progress Notes (Signed)
============================================================ ============================================================  -    Celiac test  - returned Negative  & OK  ============================================================ ============================================================

## 2021-07-04 NOTE — Progress Notes (Signed)
Annual Screening/Preventative Visit & Comprehensive Evaluation &  Examination  Future Appointments  Date Time Provider [place]  07/05/2021 10:00 AM Unk Pinto, MD [facility] None  07/05/2022  9:00 AM Unk Pinto, MD [facility] None        This very nice 55 y.o. MWF presents for a Screening /Preventative Visit & comprehensive evaluation and management of multiple medical co-morbidities.  Patient has been followed for HTN, HLD, Prediabetes  and Vitamin D Deficiency. 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 She also is treated by [facility]  presumptive for acquired ADD with Adzenys. Neuro psych testing suggested Depression & Dr Tomi Likens referred patient to Psychiatrist - Dr Dianah Field who agreed with the diagnosis of Depression & convenienced the patient to take anti-depressant.            Patient has been followed expectantly for elevated BP.  She has had a borderline elevated BP 122/88  and 140/91 in the past. 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  Patient's BP has been controlled at home and patient denies any cardiac symptoms as chest pain, palpitations, shortness of breath, dizziness or ankle swelling. Today's BP  is at goal -120/70 .  BP Readings from Last 3 Encounters:  06/26/20 118/80  08/25/19 129/87  08/11/19 (!) 140/91         Patient's hyperlipidemia is controlled with diet and medications. Patient denies myalgias or other medication SE's. Last lipids were at goal:  Lab Results  Component Value Date   CHOL 171 06/26/2020   HDL 72 06/26/2020   LDLCALC 81 06/26/2020   TRIG 93 06/26/2020   CHOLHDL 2.4 06/26/2020         Patient has been monitored proactively for glucose intolerance and patient denies reactive hypoglycemic symptoms, visual blurring, diabetic polys or paresthesias.  Last A1c was normal & at goal:  Lab Results  Component Value Date   HGBA1C 5.4 06/23/2019         Finally, patient has history of Vitamin D Deficiency and last Vitamin D was at goal:  Lab Results  Component Value Date   VD25OH 4 06/26/2020     Current Outpatient Medications on File Prior to Visit  Medication Sig   albuterol (VENTOLIN HFA) 108 (90 Base) MCG/ACT inhaler USE 2 INHALATIONS 15 MINUTES APART EVERY 4 HOURS TO RESCUE ASTHMA   Amphetamine ER (ADZENYS XR-ODT) 9.4 MG TBED Take 1 tablet daily.   aspirin 81 MG tablet  Take one pill every 4 days   B Complex Vitamins (B COMPLEX-B12) TABS Take 1 tablet daily.   Calcium 600-200 MG-UNIT per tablet Take 1 tablet every other day. Every three days   cetirizine  10 MG tablet Take 10 mg daily.   VITAMIN D 5000 u Take 1 capsule daily.   CHOLINE  Take 1 tablet  daily.   Coenzyme Q10 100 MG CAPS Take  daily.   DULoxetine 30 MG capsule TAKE 1 CAPSULE EVERY DAY   IMITREX 100 MG tablet MAX 2 TABS /24 HOURS   MAGNESIUM 400 mg  Take  2 times daily.   montelukast 10 MG tablet Take 1 tablet Daily for Allergies   Multiple Vitamin  Take 1 capsule daily.     [place] lotion  prn for rosecea   Omega-3 FISH OIL 1000 MG CAPS Take  daily.    OTC Potassium 2 tablets daily.   SUMAtriptan (IMITREX) 20 MG/ACT nasal spray May repeat in 2 hours if headache persists or recurs.   TRELEGY ELLIPTA 100-62.5-25 MCG/INH AEPB INHALE 1 PUFF INTO THE LUNGS DAILY, THEN 1 PUFF DAILY.   valACYclovir 500 MG tablet Take daily as needed    zinc  50 MG tablet Take daily.    Allergies  Allergen Reactions   Accolate [Zafirlukast]     GI Upset   Codeine     REACTION: nausea and vomiting   Latex Rash   Lyrica [Pregabalin]     Fatigue   Oxycodone     nauseated   Probiotic [Acidophilus]     Headache   Tape     Causes redness, and skin gets raw!    Past Medical History:  Diagnosis Date   Abdominal pain    in past   Abnormal chest CT    in past   Abnormal EKG     Anxiety    since head injury   Chronic cough    Chronic headache    Concussion    past injury from horse   Dyspnea    Exercise-induced asthma    Head injury 04/2013   due to horse injury/ hit backwards by horse head!   History of nephrolithiasis    Hx of migraines    Post-operative nausea and vomiting    Pulmonary nodule 2011   Rosacea     Health Maintenance  Topic Date Due   HIV Screening  Never done   Hepatitis C Screening  Never done   Zoster Vaccines- Shingrix (1 of 2) Never done   PAP SMEAR-Modifier  Never done   MAMMOGRAM  Never done   COVID-19 Vaccine (3 - Pfizer risk series) 03/07/2020   INFLUENZA VACCINE  06/11/2021   TETANUS/TDAP  05/13/2027   COLONOSCOPY  07/09/2027   Pneumococcal Vaccine 64-56 Years old (4 - PPSV23 or PCV20) 10/22/2031   HPV VACCINES  Aged Out    Immunization History  Administered Date(s) Administered   Influenza Split 08/11/2014, 08/19/2017   Influenza Whole 08/11/2012   PFIZER SARS-COV-2 Vacc 01/18/2020, 02/08/2020   PPD Test 05/26/2018, 06/23/2019, 06/26/2020   Pneumococcal -13 10/09/2017   Pneumococcal -23 12/04/2017   Pneumococcal-23 11/11/2006   Td 11/11/2006   Tdap 05/12/2017     Last Colon -  07/08/2017 - Dr Henrene Pastor - Recc 10 yr f/u - due Sept 2028   Last MGM - 06/23/2018 - Breast MRI - Dr Arvella Nigh   Past Surgical History:  Procedure Laterality Date   ELBOW SURGERY Right    2012/ tendon and reattachment left arm in 2018   EYE SURGERY Right    PVD   FOOT SURGERY Left 1991   bunionectomy   SHOULDER SURGERY Bilateral    right-2003/left 2005 due to bone spurs     Family History  Problem Relation Age of Onset   Colon polyps Father    Colitis Father    Heart disease Father    Hypertension Father    Hyperlipidemia Father    Breast cancer Maternal Grandmother    Cancer Maternal Grandmother 52       Breast,uterine,ovarian   Colon polyps Mother    Diabetes Mother    Rheum arthritis Mother    Lupus Mother     Diabetes Paternal Grandfather    Hypertension Sister    Hyperlipidemia Sister      Social History   Tobacco Use  Smoking status: Never   Smokeless tobacco: Never  Vaping Use   Vaping Use: Never used  Substance Use Topics   Alcohol use: No   Drug use: No      ROS Constitutional: Denies fever, chills, weight loss/gain, headaches, insomnia,  night sweats, and change in appetite. Does c/o fatigue. Eyes: Denies redness, blurred vision, diplopia, discharge, itchy, watery eyes.  ENT: Denies discharge, congestion, post nasal drip, epistaxis, sore throat, earache, hearing loss, dental pain, Tinnitus, Vertigo, Sinus pain, snoring.  Cardio: Denies chest pain, palpitations, irregular heartbeat, syncope, dyspnea, diaphoresis, orthopnea, PND, claudication, edema Respiratory: denies cough, dyspnea, DOE, pleurisy, hoarseness, laryngitis, wheezing.  Gastrointestinal: Denies dysphagia, heartburn, reflux, water brash, pain, cramps, nausea, vomiting, bloating, diarrhea, constipation, hematemesis, melena, hematochezia, jaundice, hemorrhoids Genitourinary: Denies dysuria, frequency, urgency, nocturia, hesitancy, discharge, hematuria, flank pain Breast: Breast lumps, nipple discharge, bleeding.  Musculoskeletal: Denies arthralgia, myalgia, stiffness, Jt. Swelling, pain, limp, and strain/sprain. Denies falls. Skin: Denies puritis, rash, hives, warts, acne, eczema, changing in skin lesion Neuro: No weakness, tremor, incoordination, spasms, paresthesia, pain Psychiatric: Denies confusion, memory loss, sensory loss. Denies Depression. Endocrine: Denies change in weight, skin, hair change, nocturia, and paresthesia, diabetic polys, visual blurring, hyper / hypo glycemic episodes.  Heme/Lymph: No excessive bleeding, bruising, enlarged lymph nodes.  Physical Exam  BP 120/70   Pulse 71   Temp 97.8 F (36.6 C)   Resp 16   Ht '5\' 9"'$  (1.753 m)   Wt 161 lb 3.2 oz (73.1 kg)   SpO2 99%   BMI 23.81 kg/m    General Appearance: Well nourished, well groomed and in no apparent distress.  Eyes: PERRLA, EOMs, conjunctiva no swelling or erythema, normal fundi and vessels. Sinuses: No frontal/maxillary tenderness ENT/Mouth: EACs patent / TMs  nl. Nares clear without erythema, swelling, mucoid exudates. Oral hygiene is good. No erythema, swelling, or exudate. Tongue normal, non-obstructing. Tonsils not swollen or erythematous. Hearing normal.  Neck: Supple, thyroid not palpable. No bruits, nodes or JVD. Respiratory: Respiratory effort normal.  BS equal and clear bilateral without rales, rhonci, wheezing or stridor. Cardio: Heart sounds are normal with regular rate and rhythm and no murmurs, rubs or gallops. Peripheral pulses are normal and equal bilaterally without edema. No aortic or femoral bruits. Chest: symmetric with normal excursions and percussion. Breasts: Symmetric, without lumps, nipple discharge, retractions, or fibrocystic changes.  Abdomen: Flat, soft with bowel sounds active. Nontender, no guarding, rebound, hernias, masses, or organomegaly.  Lymphatics: Non tender without lymphadenopathy.  Genitourinary:  Musculoskeletal: Full ROM all peripheral extremities, joint stability, 5/5 strength, and normal gait. Skin: Warm and dry without rashes, lesions, cyanosis, clubbing or  ecchymosis.  Neuro: Cranial nerves intact, reflexes equal bilaterally. Normal muscle tone, no cerebellar symptoms. Sensation intact.  Pysch: Alert and oriented X 3, normal affect, Insight and Judgment appropriate.    Assessment and Plan  1. Annual Preventative Screening Examination   2. Labile hypertension  - EKG 12-Lead - Urinalysis, Routine w reflex microscopic - Microalbumin / creatinine urine ratio - [place], RETROPERITNL ABD,  LTD - CBC with Differential/Platelet - COMPLETE METABOLIC PANEL WITH GFR - Magnesium - TSH  3. Hyperlipidemia, mixed  - EKG 12-Lead - [place], RETROPERITNL ABD,  LTD - Lipid  panel  4. Abnormal glucose  - EKG 12-Lead - [place], RETROPERITNL ABD,  LTD - Hemoglobin A1c - Insulin, random  5. Vitamin D deficiency  - VITAMIN D 25 Hydroxy  6. Post concussive syndrome   7. Screening for colorectal cancer  - POC Hemoccult  Bld/Stl   8. Screening examination for pulmonary tuberculosis  - TB Skin Test  9. Screening for ischemic heart disease  - EKG 12-Lead  10. FHx: heart disease  - EKG 12-Lead - [place], RETROPERITNL ABD,  LTD  11. Abnormal EKG  - EKG 12-Lead  12. Screening for AAA (aortic abdominal aneurysm)  - [place], RETROPERITNL ABD,  LTD  13. Current mild episode of major depressive disorder ([place])   14. Fatigue  - Iron, Total/Total Iron Binding Cap - Vitamin B12 - CBC with Differential/Platelet - TSH  15. Medication management  - CBC with Differential/Platelet - COMPLETE METABOLIC PANEL WITH GFR - Magnesium - Lipid panel - TSH - Hemoglobin A1c - Insulin, random - VITAMIN D 25 Hydroxy          Patient was counseled in prudent diet to achieve/maintain BMI less than 25 for weight control, BP monitoring, regular exercise and medications. Discussed med's effects and SE's. Screening labs and tests as requested with regular follow-up as recommended. Over 40 minutes of exam, counseling, chart review and high complex critical decision making was performed.   Kirtland Bouchard, MD

## 2021-07-04 NOTE — Patient Instructions (Signed)
Due to recent changes in healthcare laws, you may see the results of your imaging and laboratory studies on MyChart before your provider has had a chance to review them.  We understand that in some cases there may be results that are confusing or concerning to you. Not all laboratory results come back in the same time frame and the provider may be waiting for multiple results in order to interpret others.  Please give [place] 48 hours in order for your provider to thoroughly review all the results before contacting the office for clarification of your results.   ++++++++++++++++++++++++++++++  Vit D  & Vit C 1,000 mg   are recommended to help protect  against the Covid-19 and other Corona viruses.    Also it's recommended  to take  Zinc 50 mg  to help  protect against the Covid-19   and best place to get  is also on [email or web address]  and don't pay more than 6-8 cents /pill !  ================================ Coronavirus (COVID-19) Are you at risk?  Are you at risk for the Coronavirus (COVID-19)?  To be considered HIGH RISK for Coronavirus (COVID-19), you have to meet the following criteria:  Traveled to [place], [place], [place], [place] or [place]; or in the [place] to [place], [place], [place]  or [place]; and have fever, cough, and shortness of breath within the last 2 weeks of travel OR Been in close contact with a person diagnosed with COVID-19 within the last 2 weeks and have  fever, cough,and shortness of breath  IF YOU DO NOT MEET THESE CRITERIA, YOU ARE CONSIDERED LOW RISK FOR COVID-19.  What to do if you are HIGH RISK for COVID-19?  If you are having a medical emergency, call 911. Seek medical care right away. Before you go to a doctor's office, urgent care or emergency department,  call ahead and tell them about your recent travel, contact with someone diagnosed with COVID-19   and your symptoms.  You should receive instructions from your physician's office regarding  next steps of care.  When you arrive at healthcare provider, tell the healthcare staff immediately you have returned from  visiting [place], [place], [place], [place] or [place]; or traveled in the [place] to [place], [place],  [place] or [place] in the last two weeks or you have been in close contact with a person diagnosed with  COVID-19 in the last 2 weeks.   Tell the health care staff about your symptoms: fever, cough and shortness of breath. After you have been seen by a medical provider, you will be either: Tested for (COVID-19) and discharged home on quarantine except to seek medical care if  symptoms worsen, and asked to  Stay home and avoid contact with others until you get your results (4-5 days)  Avoid travel on public transportation if possible (such as bus, train, or airplane) or Sent to the Emergency Department by EMS for evaluation, COVID-19 testing  and  possible admission depending on your condition and test results.  What to do if you are LOW RISK for COVID-19?  Reduce your risk of any infection by using the same precautions used for avoiding the common cold or flu:  Wash your hands often with soap and warm water for at least 20 seconds.  If soap and water are not readily available,  use an alcohol-based hand sanitizer with at least 60% alcohol.  If coughing or sneezing, cover your mouth and nose by coughing  or sneezing into the elbow areas of your shirt or coat,  into a tissue or into your sleeve (not your hands). Avoid shaking hands with others and consider head nods or verbal greetings only. Avoid touching your eyes, nose, or mouth with unwashed hands.  Avoid close contact with people who are sick. Avoid places or events with large numbers of people in one location, like concerts or sporting events. Carefully consider travel plans you have or are making. If you are planning any travel outside or inside the [place], visit the CDC's Travelers' Health webpage for  the latest health notices. If you have some symptoms but not all symptoms, continue to monitor at home and seek medical attention  if your symptoms worsen. If you are having a medical emergency, call 911. >>>>>>>>>>>>>>>>>>>>>>> Preventive Care for Adults  A healthy lifestyle and preventive care can promote health and wellness. Preventive health guidelines for women include the following key practices. A routine yearly physical is a good way to check with your health care provider about your health and preventive screening. It is a chance to share any concerns and updates on your health and to receive a thorough exam. Visit your dentist for a routine exam and preventive care every 6 months. Brush your teeth twice a day and floss once a day. Good oral hygiene prevents tooth decay and gum disease. The frequency of eye exams is based on your age, health, family medical history, use of contact lenses, and other factors. Follow your health care provider's recommendations for frequency of eye exams. Eat a healthy diet. Foods like vegetables, fruits, whole grains, low-fat dairy products, and lean protein foods contain the nutrients you need without too many calories. Decrease your intake of foods high in solid fats, added sugars, and salt. Eat the right amount of calories for you. Get information about a proper diet from your health care provider, if necessary. Regular physical exercise is one of the most important things you can do for your health. Most adults should get at least 150 minutes of moderate-intensity exercise (any activity that increases your heart rate and causes you to sweat) each week. In addition, most adults need muscle-strengthening exercises on 2 or more days a week. Maintain a healthy weight. The body mass index (BMI) is a screening tool to identify possible weight problems. It provides an estimate of body fat based on height and weight. Your health care provider can find your BMI and can  help you achieve or maintain a healthy weight. For adults 20 years and older: A BMI below 18.5 is considered underweight. A BMI of 18.5 to 24.9 is normal. A BMI of 25 to 29.9 is considered overweight. A BMI of 30 and above is considered obese. Maintain normal blood lipids and cholesterol levels by exercising and minimizing your intake of saturated fat. Eat a balanced diet with plenty of fruit and vegetables. Blood tests for lipids and cholesterol should begin at age 43 and be repeated every 5 years. If your lipid or cholesterol levels are high, you are over 50, or you are at high risk for heart disease, you may need your cholesterol levels checked more frequently. Ongoing high lipid and cholesterol levels should be treated with medicines if diet and exercise are not working. If you smoke, find out from your health care provider how to quit. If you do not use tobacco, do not start. Lung cancer screening is recommended for adults aged 11-80 years who are at high risk for developing  lung cancer because of a history of smoking. A yearly low-dose CT scan of the lungs is recommended for people who have at least a 30-pack-year history of smoking and are a current smoker or have quit within the past 15 years. A pack year of smoking is smoking an average of 1 pack of cigarettes a day for 1 year (for example: 1 pack a day for 30 years or 2 packs a day for 15 years). Yearly screening should continue until the smoker has stopped smoking for at least 15 years. Yearly screening should be stopped for people who develop a health problem that would prevent them from having lung cancer treatment. High blood pressure causes heart disease and increases the risk of stroke. Your blood pressure should be checked at least every 1 to 2 years. Ongoing high blood pressure should be treated with medicines if weight loss and exercise do not work. If you are 32-3 years old, ask your health care provider if you should take aspirin to  prevent strokes. Diabetes screening involves taking a blood sample to check your fasting blood sugar level. This should be done once every 3 years, after age 32, if you are within normal weight and without risk factors for diabetes. Testing should be considered at a younger age or be carried out more frequently if you are overweight and have at least 1 risk factor for diabetes. Breast cancer screening is essential preventive care for women. You should practice "breast self-awareness." This means understanding the normal appearance and feel of your breasts and may include breast self-examination. Any changes detected, no matter how small, should be reported to a health care provider. Women in their 45s and 30s should have a clinical breast exam (CBE) by a health care provider as part of a regular health exam every 1 to 3 years. After age 83, women should have a CBE every year. Starting at age 92, women should consider having a mammogram (breast X-ray test) every year. Women who have a family history of breast cancer should talk to their health care provider about genetic screening. Women at a high risk of breast cancer should talk to their health care providers about having an MRI and a mammogram every year. Breast cancer gene (BRCA)-related cancer risk assessment is recommended for women who have family members with BRCA-related cancers. BRCA-related cancers include breast, ovarian, tubal, and peritoneal cancers. Having family members with these cancers may be associated with an increased risk for harmful changes (mutations) in the breast cancer genes BRCA1 and BRCA2. Results of the assessment will determine the need for genetic counseling and BRCA1 and BRCA2 testing. Routine pelvic exams to screen for cancer are no longer recommended for nonpregnant women who are considered low risk for cancer of the pelvic organs (ovaries, uterus, and vagina) and who do not have symptoms. Ask your health care provider if a  screening pelvic exam is right for you. If you have had past treatment for cervical cancer or a condition that could lead to cancer, you need Pap tests and screening for cancer for at least 20 years after your treatment. If Pap tests have been discontinued, your risk factors (such as having a new sexual partner) need to be reassessed to determine if screening should be resumed. Some women have medical problems that increase the chance of getting cervical cancer. In these cases, your health care provider may recommend more frequent screening and Pap tests. Colorectal cancer can be detected and often prevented. Most routine colorectal  cancer screening begins at the age of 50 years and continues through age 75 years. However, your health care provider may recommend screening at an earlier age if you have risk factors for colon cancer. On a yearly basis, your health care provider may provide home test kits to check for hidden blood in the stool. Use of a small camera at the end of a tube, to directly examine the colon (sigmoidoscopy or colonoscopy), can detect the earliest forms of colorectal cancer. Talk to your health care provider about this at age 50, when routine screening begins.  Direct exam of the colon should be repeated every 5-10 years through age 75 years, unless early forms of pre-cancerous polyps or small growths are found. Hepatitis C blood testing is recommended for all people born from 1945 through 1965 and any individual with known risks for hepatitis C. Pra Osteoporosis is a disease in which the bones lose minerals and strength with aging. This can result in serious bone fractures or breaks. The risk of osteoporosis can be identified using a bone density scan. Women ages 65 years and over and women at risk for fractures or osteoporosis should discuss screening with their health care providers. Ask your health care provider whether you should take a calcium supplement or vitamin D to reduce the  rate of osteoporosis. Menopause can be associated with physical symptoms and risks. Hormone replacement therapy is available to decrease symptoms and risks. You should talk to your health care provider about whether hormone replacement therapy is right for you. Use sunscreen. Apply sunscreen liberally and repeatedly throughout the day. You should seek shade when your shadow is shorter than you. Protect yourself by wearing long sleeves, pants, a wide-brimmed hat, and sunglasses year round, whenever you are outdoors. Once a month, do a whole body skin exam, using a mirror to look at the skin on your back. Tell your health care provider of new moles, moles that have irregular borders, moles that are larger than a pencil eraser, or moles that have changed in shape or color. Stay current with required vaccines (immunizations). Influenza vaccine. All adults should be immunized every year. Tetanus, diphtheria, and acellular pertussis (Td, Tdap) vaccine. Pregnant women should receive 1 dose of Tdap vaccine during each pregnancy. The dose should be obtained regardless of the length of time since the last dose. Immunization is preferred during the 27th-36th week of gestation. An adult who has not previously received Tdap or who does not know her vaccine status should receive 1 dose of Tdap. This initial dose should be followed by tetanus and diphtheria toxoids (Td) booster doses every 10 years. Adults with an unknown or incomplete history of completing a 3-dose immunization series with Td-containing vaccines should begin or complete a primary immunization series including a Tdap dose. Adults should receive a Td booster every 10 years. Varicella vaccine. An adult without evidence of immunity to varicella should receive 2 doses or a second dose if she has previously received 1 dose. Pregnant females who do not have evidence of immunity should receive the first dose after pregnancy. This first dose should be obtained  before leaving the health care facility. The second dose should be obtained 4-8 weeks after the first dose. Human papillomavirus (HPV) vaccine. Females aged 13-26 years who have not received the vaccine previously should obtain the 3-dose series. The vaccine is not recommended for use in pregnant females. However, pregnancy testing is not needed before receiving a dose. If a female is found to   be pregnant after receiving a dose, no treatment is needed. In that case, the remaining doses should be delayed until after the pregnancy. Immunization is recommended for any person with an immunocompromised condition through the age of 26 years if she did not get any or all doses earlier. During the 3-dose series, the second dose should be obtained 4-8 weeks after the first dose. The third dose should be obtained 24 weeks after the first dose and 16 weeks after the second dose. Zoster vaccine. One dose is recommended for adults aged 60 years or older unless certain conditions are present. Measles, mumps, and rubella (MMR) vaccine. Adults born before 1957 generally are considered immune to measles and mumps. Adults born in 1957 or later should have 1 or more doses of MMR vaccine unless there is a contraindication to the vaccine or there is laboratory evidence of immunity to each of the three diseases. A routine second dose of MMR vaccine should be obtained at least 28 days after the first dose for students attending postsecondary schools, health care workers, or international travelers. People who received inactivated measles vaccine or an unknown type of measles vaccine during 1963-1967 should receive 2 doses of MMR vaccine. People who received inactivated mumps vaccine or an unknown type of mumps vaccine before 1979 and are at high risk for mumps infection should consider immunization with 2 doses of MMR vaccine. For females of childbearing age, rubella immunity should be determined. If there is no evidence of immunity,  females who are not pregnant should be vaccinated. If there is no evidence of immunity, females who are pregnant should delay immunization until after pregnancy. Unvaccinated health care workers born before 1957 who lack laboratory evidence of measles, mumps, or rubella immunity or laboratory confirmation of disease should consider measles and mumps immunization with 2 doses of MMR vaccine or rubella immunization with 1 dose of MMR vaccine. Pneumococcal 13-valent conjugate (PCV13) vaccine. When indicated, a person who is uncertain of her immunization history and has no record of immunization should receive the PCV13 vaccine. An adult aged 19 years or older who has certain medical conditions and has not been previously immunized should receive 1 dose of PCV13 vaccine. This PCV13 should be followed with a dose of pneumococcal polysaccharide (PPSV23) vaccine. The PPSV23 vaccine dose should be obtained at least 1 or more year(s) after the dose of PCV13 vaccine. An adult aged 19 years or older who has certain medical conditions and previously received 1 or more doses of PPSV23 vaccine should receive 1 dose of PCV13. The PCV13 vaccine dose should be obtained 1 or more years after the last PPSV23 vaccine dose.  Pneumococcal polysaccharide (PPSV23) vaccine. When PCV13 is also indicated, PCV13 should be obtained first. All adults aged 65 years and older should be immunized. An adult younger than age 65 years who has certain medical conditions should be immunized. Any person who resides in a nursing home or long-term care facility should be immunized. An adult smoker should be immunized. People with an immunocompromised condition and certain other conditions should receive both PCV13 and PPSV23 vaccines. People with human immunodeficiency virus (HIV) infection should be immunized as soon as possible after diagnosis. Immunization during chemotherapy or radiation therapy should be avoided. Routine use of PPSV23 vaccine is  not recommended for American Indians, [place] Natives, or people younger than 65 years unless there are medical conditions that require PPSV23 vaccine. When indicated, people who have unknown immunization and have no record of immunization should receive   PPSV23 vaccine. One-time revaccination 5 years after the first dose of PPSV23 is recommended for people aged 19-64 years who have chronic kidney failure, nephrotic syndrome, asplenia, or immunocompromised conditions. People who received 1-2 doses of PPSV23 before age 56 years should receive another dose of PPSV23 vaccine at age 89 years or later if at least 5 years have passed since the previous dose. Doses of PPSV23 are not needed for people immunized with PPSV23 at or after age 70 years.  Preventive Services / Frequency  Ages 96 to 55 years Blood pressure check. Lipid and cholesterol check. Lung cancer screening. / Every year if you are aged 80-80 years and have a 30-pack-year history of smoking and currently smoke or have quit within the past 15 years. Yearly screening is stopped once you have quit smoking for at least 15 years or develop a health problem that would prevent you from having lung cancer treatment. Clinical breast exam.** / Every year after age 23 years.  BRCA-related cancer risk assessment.** / For women who have family members with a BRCA-related cancer (breast, ovarian, tubal, or peritoneal cancers). Mammogram.** / Every year beginning at age 27 years and continuing for as long as you are in good health. Consult with your health care provider. Pap test.** / Every 3 years starting at age 89 years through age 28 or 28 years with a history of 3 consecutive normal Pap tests. HPV screening.** / Every 3 years from ages 52 years through ages 70 to 10 years with a history of 3 consecutive normal Pap tests. Fecal occult blood test (FOBT) of stool. / Every year beginning at age 67 years and continuing until age 43 years. You may not need to do  this test if you get a colonoscopy every 10 years. Flexible sigmoidoscopy or colonoscopy.** / Every 5 years for a flexible sigmoidoscopy or every 10 years for a colonoscopy beginning at age 47 years and continuing until age 39 years. Hepatitis C blood test.** / For all people born from [birth year] through 1965 and any individual with known risks for hepatitis C. Skin self-exam. / Monthly. Influenza vaccine. / Every year. Tetanus, diphtheria, and acellular pertussis (Tdap/Td) vaccine.** / Consult your health care provider. Pregnant women should receive 1 dose of Tdap vaccine during each pregnancy. 1 dose of Td every 10 years. Varicella vaccine.** / Consult your health care provider. Pregnant females who do not have evidence of immunity should receive the first dose after pregnancy. Zoster vaccine.** / 1 dose for adults aged 32 years or older. Pneumococcal 13-valent conjugate (PCV13) vaccine.** / Consult your health care provider. Pneumococcal polysaccharide (PPSV23) vaccine.** / 1 to 2 doses if you smoke cigarettes or if you have certain conditions. Meningococcal vaccine.** / Consult your health care provider. Hepatitis A vaccine.** / Consult your health care provider. Hepatitis B vaccine.** / Consult your health care provider. Screening for abdominal aortic aneurysm (AAA)  by ultrasound is recommended for people over 50 who have history of high blood pressure or who are current or former smokers. ++++++++++++++++++ Recommend Adult Low Dose Aspirin or  coated  Aspirin 81 mg daily  To reduce risk of Colon Cancer 40 %,  Skin Cancer 26 % ,  Melanoma 46%  and  Pancreatic cancer 60% +++++++++++++++++++ Vitamin D goal  is between 70-100.  Please make sure that you are taking your Vitamin D as directed.  It is very important as a natural anti-inflammatory  helping hair, skin, and nails, as well as reducing stroke and heart  attack risk.  It helps your bones and helps with mood. It also decreases  numerous cancer risks so please take it as directed.  Low Vit D is associated with a 200-300% higher risk for CANCER  and 200-300% higher risk for HEART   ATTACK  &  STROKE.   ...................................... It is also associated with higher death rate at younger ages,  autoimmune diseases like Rheumatoid arthritis, Lupus, Multiple Sclerosis.    Also many other serious conditions, like depression, Alzheimer's Dementia, infertility, muscle aches, fatigue, fibromyalgia - just to name a few. ++++++++++++++++++ Recommend the book "The END of DIETING" by Dr Joel Fuhrman  & the book "The END of DIABETES " by Dr Joel Fuhrman At [employer] - get book & Audio CD's    Being diabetic has a  300% increased risk for heart attack, stroke, cancer, and alzheimer- type vascular dementia. It is very important that you work harder with diet by avoiding all foods that are white. Avoid white rice (brown & wild rice is OK), white potatoes (sweetpotatoes in moderation is OK), White bread or wheat bread or anything made out of white flour like bagels, donuts, rolls, buns, biscuits, cakes, pastries, cookies, pizza crust, and pasta (made from white flour & egg whites) - vegetarian pasta or spinach or wheat pasta is OK. Multigrain breads like Arnold's or [employer], or multigrain sandwich thins or flatbreads.  Diet, exercise and weight loss can reverse and cure diabetes in the early stages.  Diet, exercise and weight loss is very important in the control and prevention of complications of diabetes which affects every system in your body, ie. Brain - dementia/stroke, eyes - glaucoma/blindness, heart - heart attack/heart failure, kidneys - dialysis, stomach - gastric paralysis, intestines - malabsorption, nerves - severe painful neuritis, circulation - gangrene & loss of a leg(s), and finally cancer and Alzheimers.    I recommend avoid fried & greasy foods,  sweets/candy, white rice (brown or wild rice or Quinoa is  OK), white potatoes (sweet potatoes are OK) - anything made from white flour - bagels, doughnuts, rolls, buns, biscuits,white and wheat breads, pizza crust and traditional pasta made of white flour & egg white(vegetarian pasta or spinach or wheat pasta is OK).  Multi-grain bread is OK - like multi-grain flat bread or sandwich thins. Avoid alcohol in excess. Exercise is also important.    Eat all the vegetables you want - avoid meat, especially red meat and dairy - especially cheese.  Cheese is the most concentrated form of trans-fats which is the worst thing to clog up our arteries. Veggie cheese is OK which can be found in the fresh produce section at [employer] or [employer] or [employer]  ++++++++++++++++++++++ DASH Eating Plan  DASH stands for "Dietary Approaches to Stop Hypertension."   The DASH eating plan is a healthy eating plan that has been shown to reduce high blood pressure (hypertension). Additional health benefits may include reducing the risk of type 2 diabetes mellitus, heart disease, and stroke. The DASH eating plan may also help with weight loss. WHAT DO I NEED TO KNOW ABOUT THE DASH EATING PLAN? For the DASH eating plan, you will follow these general guidelines: Choose foods with a percent daily value for sodium of less than 5% (as listed on the food label). Use salt-free seasonings or herbs instead of table salt or sea salt. Check with your health care provider or pharmacist before using salt substitutes. Eat lower-sodium products, often labeled as "lower sodium" or "no   salt added." Eat fresh foods. Eat more vegetables, fruits, and low-fat dairy products. Choose whole grains. Look for the word "whole" as the first word in the ingredient list. Choose fish  Limit sweets, desserts, sugars, and sugary drinks. Choose heart-healthy fats. Eat veggie cheese  Eat more home-cooked food and less restaurant, buffet, and fast food. Limit fried foods. Cook foods using methods other  than frying. Limit canned vegetables. If you do use them, rinse them well to decrease the sodium. When eating at a restaurant, ask that your food be prepared with less salt, or no salt if possible.                      WHAT FOODS CAN I EAT? Read Dr Joel Fuhrman's books on The End of Dieting & The End of Diabetes  Grains Whole grain or whole wheat bread. Brown rice. Whole grain or whole wheat pasta. Quinoa, bulgur, and whole grain cereals. Low-sodium cereals. Corn or whole wheat flour tortillas. Whole grain cornbread. Whole grain crackers. Low-sodium crackers.  Vegetables Fresh or frozen vegetables (raw, steamed, roasted, or grilled). Low-sodium or reduced-sodium tomato and vegetable juices. Low-sodium or reduced-sodium tomato sauce and paste. Low-sodium or reduced-sodium canned vegetables.   Fruits All fresh, canned (in natural juice), or frozen fruits.  Protein Products  All fish and seafood.  Dried beans, peas, or lentils. Unsalted nuts and seeds. Unsalted canned beans.  Dairy Low-fat dairy products, such as skim or 1% milk, 2% or reduced-fat cheeses, low-fat ricotta or cottage cheese, or plain low-fat yogurt. Low-sodium or reduced-sodium cheeses.  Fats and Oils Tub margarines without trans fats. Light or reduced-fat mayonnaise and salad dressings (reduced sodium). Avocado. Safflower, olive, or canola oils. Natural peanut or almond butter.  Other Unsalted popcorn and pretzels. The items listed above may not be a complete list of recommended foods or beverages. Contact your dietitian for more options.  ++++++++++++++++++  WHAT FOODS ARE NOT RECOMMENDED? Grains/ White flour or wheat flour White bread. White pasta. White rice. Refined cornbread. Bagels and croissants. Crackers that contain trans fat.  Vegetables  Creamed or fried vegetables. Vegetables in a . Regular canned vegetables. Regular canned tomato sauce and paste. Regular tomato and vegetable juices.  Fruits Dried  fruits. Canned fruit in light or heavy syrup. Fruit juice.  Meat and Other Protein Products Meat in general - RED meat & White meat.  Fatty cuts of meat. Ribs, chicken wings, all processed meats as bacon, sausage, bologna, salami, fatback, hot dogs, bratwurst and packaged luncheon meats.  Dairy Whole or 2% milk, cream, half-and-half, and cream cheese. Whole-fat or sweetened yogurt. Full-fat cheeses or blue cheese. Non-dairy creamers and whipped toppings. Processed cheese, cheese spreads, or cheese curds.  Condiments Onion and garlic salt, seasoned salt, table salt, and sea salt. Canned and packaged gravies. Worcestershire sauce. Tartar sauce. Barbecue sauce. Teriyaki sauce. Soy sauce, including reduced sodium. Steak sauce. Fish sauce. Oyster sauce. Cocktail sauce. Horseradish. Ketchup and mustard. Meat flavorings and tenderizers. Bouillon cubes. Hot sauce. Tabasco sauce. Marinades. Taco seasonings. Relishes.  Fats and Oils Butter, stick margarine, lard, shortening and bacon fat. Coconut, palm kernel, or palm oils. Regular salad dressings.  Pickles and olives. Salted popcorn and pretzels.  The items listed above may not be a complete list of foods and beverages to avoid.   

## 2021-07-05 ENCOUNTER — Encounter: Payer: Self-pay | Admitting: Internal Medicine

## 2021-07-05 ENCOUNTER — Other Ambulatory Visit: Payer: Self-pay

## 2021-07-05 ENCOUNTER — Ambulatory Visit (INDEPENDENT_AMBULATORY_CARE_PROVIDER_SITE_OTHER): Payer: 59 | Admitting: Internal Medicine

## 2021-07-05 VITALS — BP 120/70 | HR 71 | Temp 97.8°F | Resp 16 | Ht 69.0 in | Wt 161.2 lb

## 2021-07-05 DIAGNOSIS — E559 Vitamin D deficiency, unspecified: Secondary | ICD-10-CM

## 2021-07-05 DIAGNOSIS — Z111 Encounter for screening for respiratory tuberculosis: Secondary | ICD-10-CM | POA: Diagnosis not present

## 2021-07-05 DIAGNOSIS — F320 Major depressive disorder, single episode, mild: Secondary | ICD-10-CM

## 2021-07-05 DIAGNOSIS — Z1211 Encounter for screening for malignant neoplasm of colon: Secondary | ICD-10-CM

## 2021-07-05 DIAGNOSIS — E782 Mixed hyperlipidemia: Secondary | ICD-10-CM

## 2021-07-05 DIAGNOSIS — F0781 Postconcussional syndrome: Secondary | ICD-10-CM

## 2021-07-05 DIAGNOSIS — R7309 Other abnormal glucose: Secondary | ICD-10-CM

## 2021-07-05 DIAGNOSIS — R0989 Other specified symptoms and signs involving the circulatory and respiratory systems: Secondary | ICD-10-CM | POA: Diagnosis not present

## 2021-07-05 DIAGNOSIS — Z8249 Family history of ischemic heart disease and other diseases of the circulatory system: Secondary | ICD-10-CM

## 2021-07-05 DIAGNOSIS — R5383 Other fatigue: Secondary | ICD-10-CM

## 2021-07-05 DIAGNOSIS — Z0000 Encounter for general adult medical examination without abnormal findings: Secondary | ICD-10-CM

## 2021-07-05 DIAGNOSIS — Z136 Encounter for screening for cardiovascular disorders: Secondary | ICD-10-CM

## 2021-07-05 DIAGNOSIS — Z0001 Encounter for general adult medical examination with abnormal findings: Secondary | ICD-10-CM

## 2021-07-05 DIAGNOSIS — R9431 Abnormal electrocardiogram [ECG] [EKG]: Secondary | ICD-10-CM

## 2021-07-05 DIAGNOSIS — Z79899 Other long term (current) drug therapy: Secondary | ICD-10-CM

## 2021-07-06 LAB — INSULIN, RANDOM: Insulin: 4.4 u[IU]/mL

## 2021-07-06 LAB — CBC WITH DIFFERENTIAL/PLATELET
Absolute Monocytes: 528 cells/uL (ref 200–950)
Basophils Absolute: 90 cells/uL (ref 0–200)
Basophils Relative: 1.5 %
Eosinophils Absolute: 132 cells/uL (ref 15–500)
Eosinophils Relative: 2.2 %
HCT: 39.8 % (ref 35.0–45.0)
Hemoglobin: 13.1 g/dL (ref 11.7–15.5)
Lymphs Abs: 2166 cells/uL (ref 850–3900)
MCH: 33.2 pg — ABNORMAL HIGH (ref 27.0–33.0)
MCHC: 32.9 g/dL (ref 32.0–36.0)
MCV: 101 fL — ABNORMAL HIGH (ref 80.0–100.0)
MPV: 10.3 fL (ref 7.5–12.5)
Monocytes Relative: 8.8 %
Neutro Abs: 3084 cells/uL (ref 1500–7800)
Neutrophils Relative %: 51.4 %
Platelets: 262 10*3/uL (ref 140–400)
RBC: 3.94 10*6/uL (ref 3.80–5.10)
RDW: 12.4 % (ref 11.0–15.0)
Total Lymphocyte: 36.1 %
WBC: 6 10*3/uL (ref 3.8–10.8)

## 2021-07-06 LAB — COMPLETE METABOLIC PANEL WITH GFR
AG Ratio: 1.9 (calc) (ref 1.0–2.5)
ALT: 27 U/L (ref 6–29)
AST: 27 U/L (ref 10–35)
Albumin: 4.4 g/dL (ref 3.6–5.1)
Alkaline phosphatase (APISO): 120 U/L (ref 37–153)
BUN: 20 mg/dL (ref 7–25)
CO2: 25 mmol/L (ref 20–32)
Calcium: 9.9 mg/dL (ref 8.6–10.4)
Chloride: 105 mmol/L (ref 98–110)
Creat: 0.81 mg/dL (ref 0.50–1.03)
Globulin: 2.3 g/dL (calc) (ref 1.9–3.7)
Glucose, Bld: 86 mg/dL (ref 65–99)
Potassium: 4.3 mmol/L (ref 3.5–5.3)
Sodium: 138 mmol/L (ref 135–146)
Total Bilirubin: 0.5 mg/dL (ref 0.2–1.2)
Total Protein: 6.7 g/dL (ref 6.1–8.1)
eGFR: 86 mL/min/{1.73_m2} (ref >=60)

## 2021-07-06 LAB — MICROALBUMIN / CREATININE URINE RATIO
Creatinine, Urine: 58 mg/dL (ref 20–275)
Microalb Creat Ratio: 7 mcg/mg creat (ref <30)
Microalb, Ur: 0.4 mg/dL

## 2021-07-06 LAB — VITAMIN B12: Vitamin B-12: 695 pg/mL (ref 200–1100)

## 2021-07-06 LAB — URINALYSIS, ROUTINE W REFLEX MICROSCOPIC
Bilirubin Urine: NEGATIVE
Glucose, UA: NEGATIVE
Hgb urine dipstick: NEGATIVE
Ketones, ur: NEGATIVE
Leukocytes,Ua: NEGATIVE
Nitrite: NEGATIVE
Protein, ur: NEGATIVE
Specific Gravity, Urine: 1.013 (ref 1.001–1.035)
pH: 5.5 (ref 5.0–8.0)

## 2021-07-06 LAB — LIPID PANEL
Cholesterol: 204 mg/dL — ABNORMAL HIGH (ref <200)
HDL: 80 mg/dL (ref >=50)
LDL Cholesterol (Calc): 105 mg/dL (calc) — ABNORMAL HIGH
Non-HDL Cholesterol (Calc): 124 mg/dL (calc) (ref <130)
Total CHOL/HDL Ratio: 2.6 (calc) (ref <5.0)
Triglycerides: 98 mg/dL (ref <150)

## 2021-07-06 LAB — MAGNESIUM: Magnesium: 2.1 mg/dL (ref 1.5–2.5)

## 2021-07-06 LAB — TSH: TSH: 0.86 mIU/L

## 2021-07-06 LAB — IRON, TOTAL/TOTAL IRON BINDING CAP
%SAT: 31 % (calc) (ref 16–45)
Iron: 100 ug/dL (ref 45–160)
TIBC: 321 mcg/dL (calc) (ref 250–450)

## 2021-07-06 LAB — HEMOGLOBIN A1C
Hgb A1c MFr Bld: 5.5 % of total Hgb (ref <5.7)
Mean Plasma Glucose: 111 mg/dL
eAG (mmol/L): 6.2 mmol/L

## 2021-07-06 LAB — VITAMIN D 25 HYDROXY (VIT D DEFICIENCY, FRACTURES): Vit D, 25-Hydroxy: 92 ng/mL (ref 30–100)

## 2021-07-06 NOTE — Progress Notes (Signed)
============================================================ -   Test results slightly outside the reference range are not unusual. If there is anything important, I will review this with you,  otherwise it is considered normal test values.  If you have further questions,  please do not hesitate to contact me at the office or via My Chart.  ============================================================ ============================================================  -  Iron & Vitamin B12 levels both Normal & OK  ============================================================ ============================================================  -  Total Chol has gone up from 171 to now 204             (  Ideal or Goal is less than 180  !  )  - and   - Bad /Dangerous LDL Chol also has gone up from 81 to now  105             (  Ideal or Goal is less than 70  !  )    -  Recommend  a stricter low cholesterol diet   - Cholesterol only comes from animal sources  - ie. meat, dairy, egg yolks  - Eat all the vegetables you want.  - Avoid meat, especially Red meat - Beef AND Pork .  - Avoid cheese & dairy - milk & ice cream.     - Cheese is the most concentrated form of trans-fats which  is the worst thing to clog up our arteries.   - Veggie cheese is OK which can be found in the fresh  produce section at [employer] or [employer] or [employer] ============================================================ ============================================================  -  A1c is Normal - No Diabetes - Great ! ============================================================ ============================================================  -  Vitamin D = 92 - Excellent  ============================================================ ============================================================  -  All Else - CBC - Kidneys - Electrolytes - Liver - Magnesium & Thyroid    - all  Normal /  OK ============================================================ ============================================================  -  Keep  up the [place] Work !  ============================================================ ============================================================

## 2021-07-08 ENCOUNTER — Encounter: Payer: Self-pay | Admitting: Internal Medicine

## 2021-07-27 ENCOUNTER — Telehealth: Payer: Self-pay

## 2021-07-27 ENCOUNTER — Other Ambulatory Visit: Payer: Self-pay | Admitting: Sports Medicine

## 2021-07-27 DIAGNOSIS — S069X0S Unspecified intracranial injury without loss of consciousness, sequela: Secondary | ICD-10-CM

## 2021-07-27 NOTE — Telephone Encounter (Signed)
Patient should get no more refills until she is seen.

## 2021-07-27 NOTE — Telephone Encounter (Signed)
-----   Message from [place] sent at 07/27/2021 11:00 AM EDT ----- Regarding: RE: Needs appt with T Patient stated she was being seen at [facility] and that she called in a prescription at [facility] and that they must have got the providers mixed up so she stated she would call [facility] and get this taken care of and that she doesn't need an appointment with Dr T at this time. AM ----- Message ----- From: Dema Severin, CMA Sent: 07/27/2021  10:41 AM EDT To: [facility] Pool Subject: Needs appt with T                              Patient was given 30 days of meds. Patient need an appt with T. Please call and schedule.   Thanks,  Zacaria Pousson

## 2021-07-28 ENCOUNTER — Other Ambulatory Visit: Payer: Self-pay | Admitting: Sports Medicine

## 2021-07-28 DIAGNOSIS — S069X0S Unspecified intracranial injury without loss of consciousness, sequela: Secondary | ICD-10-CM

## 2021-08-21 ENCOUNTER — Ambulatory Visit: Payer: 59 | Admitting: Internal Medicine

## 2021-08-21 ENCOUNTER — Other Ambulatory Visit: Payer: Self-pay

## 2021-08-21 ENCOUNTER — Encounter: Payer: Self-pay | Admitting: Internal Medicine

## 2021-08-21 VITALS — BP 122/76 | HR 69 | Temp 97.1°F | Resp 16 | Ht 69.0 in | Wt 158.8 lb

## 2021-08-21 DIAGNOSIS — B379 Candidiasis, unspecified: Secondary | ICD-10-CM

## 2021-08-21 DIAGNOSIS — S01451A Open bite of right cheek and temporomandibular area, initial encounter: Secondary | ICD-10-CM

## 2021-08-21 MED ORDER — DOXYCYCLINE HYCLATE 100 MG PO CAPS: ORAL_CAPSULE | ORAL | 1 refills | Status: DC

## 2021-08-21 MED ORDER — FLUCONAZOLE 150 MG PO TABS: ORAL_TABLET | ORAL | 3 refills | Status: DC

## 2021-08-21 NOTE — Progress Notes (Signed)
Future Appointments  Date Time Provider [place]  07/05/2022  9:00 AM Unk Pinto, MD [facility] None    History of Present Illness:      Patient is a very nice 55 yo MWF who presents with concerns of a dog bite /laceration of her Rt cheek 4-5 days ago. Patient reports there has been STS & erythema of her cheek .    Medications    Current Outpatient Medications (Respiratory):    albuterol HFA  inhaler, USE 2 INHALATIONS EVERY 4 HRS TO RESCUE ASTHMA   cetirizine  10 MG tablet, Take daily.   montelukast  10 MG tablet, Take 1 tablet Daily for Allergies   TRELEGY ELLIPTA 100-62.5-25 , INHALE 1 PUFF  DAILY  Current Outpatient Medications (Analgesics):    aspirin 81 MG tablet, Take  every 4 days   IMITREX 100 MG tablet, TAKE 1 TABLET IMMEDIATELY AT ONSET OF MIGRAINE & MAY REPEAT ONCE IN 2 HOURS (MAX 2 TABS /24 HOURS).   IMITREX 20 MG/ACT nasal spray, May repeat in 2 hours if headache persists or recurs.    Amphetamine ER 9.4 MG TBED, Take 1 tablet  daily.   B Complex -B12 TABS, Take 1 tablet  daily.   Calcium 600-200 mg-u, Take 1 tablet every three days   VITAMIN D  5000 u, Take 1 capsule daily.   CHOLINE , Take 1 tablet  daily.   Coenzyme Q10 100 MG CAPS, Take daily.   DULoxetine 30 MG capsule, Take 1 capsule daily   MAGNESIUM 400 mg , Take  2 (two) times daily.   Multiple Vitamin , Take 1 capsule by mouth daily.     Metro lotion prn for rosecea   Omega-3  FISH OIL 1000 MG CAPS, Take daily.   OTC Potassium 2 tablets daily.   valACYclovir (VALTREX) 500 MG tablet, Take daily as needed (for outbreak).    zinc 50 MG tablet, Take daily.  Problem list She has PULMONARY NODULE; EPIGASTRIC PAIN; ABNORMAL EKG; Cough variant asthma; Anxiety; Hx of migraines; Rosacea; History of nephrolithiasis; Vitamin D deficiency; Medication management; CKD Stage 2 (GFR 72 ml/min); Allergic rhinitis; Historical TBI with chronic depressive symptoms; Elevated BP, Screening; Elevated  cholesterol, screening; Other abnormal glucose, screening; Allergic asthma; Benign neoplasm of descending colon; Scapular dyskinesis; Positive ANA (antinuclear antibody); Elevated testosterone level; and Radiculitis of left cervical region on their problem list.   Observations/Objective:  BP 122/76   Pulse 69   Temp (!) 97.1 F (36.2 C)   Resp 16   Ht 5\' 9"  (1.753 m)   Wt 158 lb 12.8 oz (72 kg)   SpO2 94%   BMI 23.45 kg/m   HEENT - WNL. Neck - supple.  Chest - Clear equal BS. Cor - Nl HS. RRR w/o sig MGR. PP 1(+). No edema. MS- FROM w/o deformities.  Gait Nl. Skin - Rt.  cheek has a 4 x 4 cm area of tender STS & erythema.  Neuro -  Nl w/o focal abnormalities.  Assessment and Plan:   1. Dog bite of cheek, right, initial encounter  - doxycycline  100 MG capsule;  Take 1 capsule 2 x /day with meals for Infection   Dispense: 20 capsule; Refill: 1  2. Antibiotic-induced yeast infection  - fluconazole (DIFLUCAN) 150 MG tablet;  Take 1 tablet 2 x /week  if needed for yeast infection   Dispense: 8 tablet; Refill: 3   Follow Up Instructions:        I  discussed the assessment and treatment plan with the patient. The patient was provided an opportunity to ask questions and all were answered. The patient agreed with the plan and demonstrated an understanding of the instructions.       The patient was advised to call back or seek an in-person evaluation if the symptoms worsen or if the condition fails to improve as anticipated.   Kirtland Bouchard, MD

## 2021-08-25 ENCOUNTER — Encounter: Payer: Self-pay | Admitting: Internal Medicine

## 2021-09-13 ENCOUNTER — Ambulatory Visit: Payer: 59 | Admitting: Internal Medicine

## 2021-09-13 ENCOUNTER — Other Ambulatory Visit: Payer: Self-pay

## 2021-09-13 VITALS — BP 126/80 | HR 71 | Temp 98.0°F | Resp 18 | Ht 69.0 in | Wt 160.4 lb

## 2021-09-13 DIAGNOSIS — Z1152 Encounter for screening for COVID-19: Secondary | ICD-10-CM | POA: Diagnosis not present

## 2021-09-13 DIAGNOSIS — G4483 Primary cough headache: Secondary | ICD-10-CM

## 2021-09-13 DIAGNOSIS — J40 Bronchitis, not specified as acute or chronic: Secondary | ICD-10-CM

## 2021-09-13 DIAGNOSIS — J329 Chronic sinusitis, unspecified: Secondary | ICD-10-CM

## 2021-09-13 LAB — TB SKIN TEST
Induration: 0 mm
TB Skin Test: NEGATIVE

## 2021-09-13 LAB — POC COVID19 BINAXNOW: SARS Coronavirus 2 Ag: NEGATIVE

## 2021-09-13 MED ORDER — PROMETHAZINE-DM 6.25-15 MG/5ML PO SYRP: ORAL_SOLUTION | ORAL | 1 refills | Status: DC

## 2021-09-13 MED ORDER — DEXAMETHASONE 4 MG PO TABS: ORAL_TABLET | ORAL | 0 refills | Status: DC

## 2021-09-13 MED ORDER — AZITHROMYCIN 250 MG PO TABS: ORAL_TABLET | ORAL | 1 refills | Status: DC

## 2021-09-13 NOTE — Progress Notes (Signed)
History of Present Illness:     Patient is a very nice 55 yo MWF presenting with c/o head/chest congestion , PND, S/T, productive cough  thick greenish yell sputum. Covid test is negative today. No fever, chills , seweats, rash or dyspnea.      Medications     albuterol (VENTOLIN HFA) 108 (90 Base) MCG/ACT inhaler, USE 2 INHALATIONS 15 MINUTES APART EVERY 4 HOURS TO RESCUE ASTHMA   cetirizine (ZYRTEC) 10 MG tablet, Take 10 mg by mouth daily.   montelukast (SINGULAIR) 10 MG tablet, Take 1 tablet Daily for Allergies   TRELEGY ELLIPTA 100-62.5-25 MCG/INH AEPB, INHALE 1 PUFF INTO THE LUNGS DAILY, THEN 1 PUFF DAILY.   aspirin 81 MG tablet, Take 81 mg by mouth. Take one pill every 4 days   IMITREX 100 MG tablet, TAKE 1 TABLET IMMEDIATELY AT ONSET OF MIGRAINE & MAY REPEAT ONCE IN 2 HOURS (MAX 2 TABS /24 HOURS).   SUMAtriptan (IMITREX) 20 MG/ACT nasal spray, May repeat in 2 hours if headache persists or recurs.    Amphetamine ER 9.4 MG TBED, Take 1 tablet by mouth daily.   B Complex Vitamins (B COMPLEX-B12) TABS, Take 1 tablet by mouth daily.   Calcium 600-200 MG-UNIT per tablet, Take 1 tablet by mouth every other day. Every three days   Cholecalciferol (VITAMIN D) 125 MCG (5000 UT) CAPS, Take 1 capsule by mouth daily.   CHOLINE PO, Take 1 tablet by mouth daily.   Coenzyme Q10 (COQ10) 100 MG CAPS, Take by mouth daily.   doxycycline (VIBRAMYCIN) 100 MG capsule, Take 1 capsule 2 x /day with meals for Infection   DULoxetine (CYMBALTA) 30 MG capsule, Take 1 capsule (30 mg total) by mouth daily. Patient needs appt for additional refills. Call office to schedule.   fluconazole (DIFLUCAN) 150 MG tablet, Take 1 tablet 2 x /week  if needed for yeast infection   MAGNESIUM GLYCINATE PLUS PO, Take 400 mg by mouth 2 (two) times daily.   Multiple Vitamin (MULTIVITAMIN) capsule, Take 1 capsule by mouth daily.     NON FORMULARY, Metro lotion prn for rosecea   Omega-3 Fatty Acids (FISH OIL) 1000 MG CAPS, Take  1,000 mg by mouth daily.   OVER THE COUNTER MEDICATION, OTC Potassium 2 tablets daily.   valACYclovir (VALTREX) 500 MG tablet, Take 500 mg by mouth daily as needed (for outbreak).    zinc gluconate 50 MG tablet, Take 50 mg by mouth daily.  Problem list She has PULMONARY NODULE; EPIGASTRIC PAIN; ABNORMAL EKG; Cough variant asthma; Anxiety; Hx of migraines; Rosacea; History of nephrolithiasis; Vitamin D deficiency; Medication management; CKD Stage 2 (GFR 72 ml/min); Allergic rhinitis; Historical TBI with chronic depressive symptoms; Elevated BP, Screening; Elevated cholesterol, screening; Other abnormal glucose, screening; Allergic asthma; Benign neoplasm of descending colon; Scapular dyskinesis; Positive ANA (antinuclear antibody); Elevated testosterone level; and Radiculitis of left cervical region on their problem list.   Observations/Objective:  BP 126/80   Pulse 71   Temp 98 F (36.7 C)   Resp 18   Ht 5\' 9"  (1.753 m)   Wt 160 lb 6.4 oz (72.8 kg)   SpO2 98%   BMI 23.69 kg/m   HEENT - WNL. Neck - supple.  Chest - Clear equal BS. Cor - Nl HS. RRR w/o sig MGR. PP 1(+). No edema. MS- FROM w/o deformities.  Gait Nl. Neuro -  Nl w/o focal abnormalities.   Assessment and Plan:  1. Sinobronchitis  - dexamethasone  4  MG tablet;  Take 1 tab 3 x day - 3 days, then 2 x day - 3 days, then 1 tab daily   Dispense: 20 tablet  - azithromycin 250 MG tablet;  Take 2 tablets with Food on  Day 1, then 1 tablet Daily with Food    Dispense: 6 each; Refill: 1  - promethazine-DM syrup; Take 1 tsp every 4 hours if needed for cough   Dispense: 240 mL; Refill: 1  2. Cough headache  - POC COVID-19   Follow Up Instructions:        I discussed the assessment and treatment plan with the patient. The patient was provided an opportunity to ask questions and all were answered. The patient agreed with the plan and demonstrated an understanding of the instructions.       The patient was advised to  call back or seek an in-person evaluation if the symptoms worsen or if the condition fails to improve as anticipated.    Kirtland Bouchard, MD

## 2021-09-15 ENCOUNTER — Encounter: Payer: Self-pay | Admitting: Internal Medicine

## 2021-10-18 ENCOUNTER — Other Ambulatory Visit: Payer: Self-pay | Admitting: Obstetrics and Gynecology

## 2021-10-18 DIAGNOSIS — Z803 Family history of malignant neoplasm of breast: Secondary | ICD-10-CM

## 2021-11-01 ENCOUNTER — Other Ambulatory Visit: Payer: Self-pay | Admitting: Adult Health

## 2021-11-01 ENCOUNTER — Other Ambulatory Visit: Payer: Self-pay | Admitting: Adult Health Nurse Practitioner

## 2021-11-01 ENCOUNTER — Ambulatory Visit
Admission: RE | Admit: 2021-11-01 | Discharge: 2021-11-01 | Disposition: A | Discharge status: Home / Self Care | Payer: 59 | Source: Ambulatory Visit | Attending: Obstetrics and Gynecology | Admitting: Obstetrics and Gynecology

## 2021-11-01 ENCOUNTER — Other Ambulatory Visit: Payer: Self-pay

## 2021-11-01 DIAGNOSIS — Z803 Family history of malignant neoplasm of breast: Secondary | ICD-10-CM

## 2021-11-01 MED ORDER — GADOBUTROL 1 MMOL/ML IV SOLN
7.0000 mL | Freq: Once | INTRAVENOUS | Status: AC | PRN
  Administered 2021-11-01: 15:00:00 7 mL via INTRAVENOUS

## 2022-02-07 ENCOUNTER — Ambulatory Visit: Payer: 59 | Admitting: Pulmonary Disease

## 2022-02-07 ENCOUNTER — Encounter: Payer: Self-pay | Admitting: Pulmonary Disease

## 2022-02-07 VITALS — BP 126/78 | HR 83 | Temp 98.2°F | Ht 69.0 in | Wt 162.4 lb

## 2022-02-07 DIAGNOSIS — J45991 Cough variant asthma: Secondary | ICD-10-CM

## 2022-02-07 DIAGNOSIS — R053 Chronic cough: Secondary | ICD-10-CM | POA: Diagnosis not present

## 2022-02-07 DIAGNOSIS — R0602 Shortness of breath: Secondary | ICD-10-CM | POA: Diagnosis not present

## 2022-02-07 DIAGNOSIS — R911 Solitary pulmonary nodule: Secondary | ICD-10-CM

## 2022-02-07 NOTE — Patient Instructions (Signed)
Thank you for visiting Dr. Valeta Harms at [facility]. ?Today we recommend the following: ? ?Orders Placed This Encounter  ?Procedures  ? CT CHEST HIGH RESOLUTION  ? Pulmonary Function Test  ? ?Please complete tests above and see [place] in clinic afterwards.  ? ?Return in about 3 weeks (around 02/28/2022) for with APP or Dr. Valeta Harms. ? ? ? ?Please do your part to reduce the spread of COVID-19.  ? ?

## 2022-02-07 NOTE — Progress Notes (Signed)
? ?Synopsis: Referred in March 2023 for cough by Unk Pinto, MD ? ?Subjective:  ? ?PATIENT ID: Nicole Waller GENDER: female DOB: January 06, 1966, MRN: [id number] ? ?Chief Complaint  ?Patient presents with  ? Consult  ?  Pt is coming today due to a cough and also states she has had some problems with allergies and shortness of breath. States her cough is wet and she will occasionally cough up green phlegm.  ? ? 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  Here today for evaluation of ongoing cough.  Was seen in 2012 by Dr. Melvyn Novas.  She had PFTs completed at that time.  However just a couple years ago she was diagnosed with rheumatoid arthritis and started on methotrexate.  Was transition from methotrexate to [place] and Humira.  She still has some shortness of breath.  This predominantly seen with exertion.  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  She has some other orthopedic issues has received some steroid injections.  She still uses her Trelegy daily.  Which is helping manage some of her asthma symptoms.  She is not really sure if there is something going on with her lungs or not. ? ? ?Past Medical History:  ?Diagnosis Date  ? Abdominal pain   ? in past  ? Abnormal chest CT   ? in past  ? Abnormal EKG   ? Anxiety   ? since head injury  ? Chronic cough   ? Chronic headache   ? Concussion   ? past injury from horse  ? Dyspnea   ? Exercise-induced asthma   ? Head injury 04/2013  ? due to horse injury/ hit backwards by horse head!  ? History of nephrolithiasis   ? Hx of migraines   ? Post-operative nausea and vomiting   ? Pulmonary nodule 2011  ? Rosacea   ?  ? ?Family History  ?Problem Relation Age of Onset  ? Colon polyps Father   ? Colitis Father   ? Heart disease Father   ? Hypertension Father   ? Hyperlipidemia Father   ? Breast cancer Maternal Grandmother    ? Cancer Maternal Grandmother 76  ?     Breast,uterine,ovarian  ? Colon polyps Mother   ? Diabetes Mother   ? Rheum arthritis Mother   ? Lupus Mother   ? Diabetes Paternal Grandfather   ? Hypertension Sister   ? Hyperlipidemia Sister   ?  ? ?Past Surgical History:  ?Procedure Laterality Date  ? ELBOW SURGERY Right   ? 2012/ tendon and reattachment left arm in 2018  ? EYE SURGERY Right   ? PVD  ? FOOT SURGERY Left 1991  ? bunionectomy  ? SHOULDER SURGERY Bilateral   ? right-2003/left 2005 due to bone spurs  ? ? ?Social History  ? ?Socioeconomic History  ? Marital status: Married  ?  Spouse name: Nicole Kindred  ? Number of children: Not on file  ? Years of education: Not on file  ? Highest education level: Not on file  ?Occupational History  ? Not on file  ?Tobacco Use  ? Smoking status: Never  ? Smokeless tobacco: Never  ?Vaping Use  ? Vaping Use: Never used  ?Substance and Sexual Activity  ? Alcohol use: No  ? Drug use: No  ? Sexual activity: Not  on file  ?Other Topics Concern  ? Not on file  ?Social History Narrative  ? Not on file  ? ?Social Determinants of Health  ? ?Financial Resource Strain: Not on file  ?Food Insecurity: Not on file  ?Transportation Needs: Not on file  ?Physical Activity: Not on file  ?Stress: Not on file  ?Social Connections: Not on file  ?Intimate Partner Violence: Not on file  ?  ? ?Allergies  ?Allergen Reactions  ? Accolate [Zafirlukast]   ?  GI Upset  ? Codeine   ?  REACTION: nausea and vomiting  ? Lyrica [Pregabalin]   ?  Fatigue  ? Oxycodone   ?  nauseated  ? Probiotic [Acidophilus]   ?  Headache  ? Tape   ?  Causes redness, and skin gets raw!  ?  ? ?Outpatient Medications Prior to Visit  ?Medication Sig Dispense Refill  ? Adalimumab (HUMIRA PEN) 40 MG/0.4ML PNKT 0.4 ml    ? albuterol (VENTOLIN HFA) 108 (90 Base) MCG/ACT inhaler USE 2 INHALATIONS 15 MINUTES APART EVERY 4 HOURS TO RESCUE ASTHMA 18 each 11  ? Amphetamine ER 9.4 MG TBED Take 1 tablet by mouth daily.    ? aspirin 81 MG tablet Take  81 mg by mouth. Take one pill every 4 days    ? B Complex Vitamins (B COMPLEX-B12) TABS Take 1 tablet by mouth daily.    ? Calcium 600-200 MG-UNIT per tablet Take 1 tablet by mouth every other day. Every three days    ? cetirizine (ZYRTEC) 10 MG tablet Take 10 mg by mouth daily.    ? Cholecalciferol (VITAMIN D) 125 MCG (5000 UT) CAPS Take 1 capsule by mouth daily.    ? CHOLINE PO Take 1 tablet by mouth daily.    ? Coenzyme Q10 (COQ10) 100 MG CAPS Take by mouth daily.    ? DULoxetine (CYMBALTA) 30 MG capsule Take 1 capsule (30 mg total) by mouth daily. Patient needs appt for additional refills. Call office to schedule. 30 capsule 0  ? IMITREX 100 MG tablet TAKE 1 TABLET IMMEDIATELY AT ONSET OF MIGRAINE & MAY REPEAT ONCE IN 2 HOURS (MAX 2 TABS /24 HOURS). 9 tablet 3  ? leflunomide (ARAVA) 20 MG tablet 1 tablet    ? levothyroxine (SYNTHROID) 50 MCG tablet 1 tablet in the morning on an empty stomach    ? MAGNESIUM GLYCINATE PLUS PO Take 400 mg by mouth 2 (two) times daily.    ? montelukast (SINGULAIR) 10 MG tablet Take 1 tablet Daily for Allergies 90 tablet 3  ? Multiple Vitamin (MULTIVITAMIN) capsule Take 1 capsule by mouth daily.      ? NON FORMULARY [employer] lotion prn for rosecea    ? Omega-3 Fatty Acids (FISH OIL) 1000 MG CAPS Take 1,000 mg by mouth daily.    ? OVER THE COUNTER MEDICATION OTC Potassium 2 tablets daily.    ? TRELEGY ELLIPTA 100-62.5-25 MCG/ACT AEPB INHALE 1 PUFF BY MOUTH DAILY 60 each 11  ? valACYclovir (VALTREX) 500 MG tablet Take 500 mg by mouth daily as needed (for outbreak).     ? zinc gluconate 50 MG tablet Take 50 mg by mouth daily.    ? azithromycin (ZITHROMAX) 250 MG tablet Take 2 tablets with Food on  Day 1, then 1 tablet Daily with Food for Sinusitis / Bronchitis 6 each 1  ? dexamethasone (DECADRON) 4 MG tablet Take 1 tab 3 x day - 3 days, then 2 x day - 3 days,  then 1 tab daily 20 tablet 0  ? fluconazole (DIFLUCAN) 150 MG tablet Take 1 tablet 2 x /week  if needed for yeast infection 8 tablet  3  ? promethazine-dextromethorphan (PROMETHAZINE-DM) 6.25-15 MG/5ML syrup Take 1 tsp every 4 hours if needed for cough 240 mL 1  ? SUMAtriptan (IMITREX) 20 MG/ACT nasal spray May repeat in 2 hours if headache persists or recurs. 6 Inhaler 5  ? ?No facility-administered medications prior to visit.  ? ? ?Review of Systems  ?Constitutional:  Negative for chills, fever, malaise/fatigue and weight loss.  ?HENT:  Negative for hearing loss, sore throat and tinnitus.   ?Eyes:  Negative for blurred vision and double vision.  ?Respiratory:  Positive for cough and shortness of breath. Negative for hemoptysis, sputum production, wheezing and stridor.   ?Cardiovascular:  Negative for chest pain, palpitations, orthopnea, leg swelling and PND.  ?Gastrointestinal:  Negative for abdominal pain, constipation, diarrhea, heartburn, nausea and vomiting.  ?Genitourinary:  Negative for dysuria, hematuria and urgency.  ?Musculoskeletal:  Positive for joint pain. Negative for myalgias.  ?Skin:  Negative for itching and rash.  ?Neurological:  Negative for dizziness, tingling, weakness and headaches.  ?Endo/Heme/Allergies:  Negative for environmental allergies. Does not bruise/bleed easily.  ?Psychiatric/Behavioral:  Negative for depression. The patient is not nervous/anxious and does not have insomnia.   ?All other systems reviewed and are negative. ? ? ?Objective:  ?Physical Exam ?Vitals reviewed.  ?Constitutional:   ?   General: She is not in acute distress. ?   Appearance: She is well-developed.  ?HENT:  ?   Head: Normocephalic and atraumatic.  ?Eyes:  ?   General: No scleral icterus. ?   Conjunctiva/sclera: Conjunctivae normal.  ?   Pupils: Pupils are equal, round, and reactive to light.  ?Neck:  ?   Vascular: No JVD.  ?   Trachea: No tracheal deviation.  ?Cardiovascular:  ?   Rate and Rhythm: Normal rate and regular rhythm.  ?   Heart sounds: Normal heart sounds. No murmur heard. ?Pulmonary:  ?   Effort: Pulmonary effort is normal. No  tachypnea, accessory muscle usage or respiratory distress.  ?   Breath sounds: No stridor. No wheezing, rhonchi or rales.  ?Abdominal:  ?   General: There is no distension.  ?   Palpations: Abdomen is

## 2022-02-20 ENCOUNTER — Other Ambulatory Visit: Payer: 59

## 2022-02-22 ENCOUNTER — Ambulatory Visit (INDEPENDENT_AMBULATORY_CARE_PROVIDER_SITE_OTHER)
Admission: RE | Admit: 2022-02-22 | Discharge: 2022-02-22 | Disposition: A | Discharge status: Home / Self Care | Payer: 59 | Source: Ambulatory Visit | Attending: Pulmonary Disease | Admitting: Pulmonary Disease

## 2022-02-22 DIAGNOSIS — R053 Chronic cough: Secondary | ICD-10-CM | POA: Diagnosis not present

## 2022-02-22 DIAGNOSIS — R0602 Shortness of breath: Secondary | ICD-10-CM | POA: Diagnosis not present

## 2022-02-26 ENCOUNTER — Encounter: Payer: Self-pay | Admitting: Adult Health

## 2022-02-26 ENCOUNTER — Ambulatory Visit (INDEPENDENT_AMBULATORY_CARE_PROVIDER_SITE_OTHER): Payer: 59 | Admitting: Pulmonary Disease

## 2022-02-26 ENCOUNTER — Ambulatory Visit: Payer: 59 | Admitting: Adult Health

## 2022-02-26 VITALS — BP 112/70 | HR 95 | Temp 98.2°F | Ht 69.0 in | Wt 161.6 lb

## 2022-02-26 DIAGNOSIS — J45991 Cough variant asthma: Secondary | ICD-10-CM

## 2022-02-26 DIAGNOSIS — R0602 Shortness of breath: Secondary | ICD-10-CM

## 2022-02-26 DIAGNOSIS — R053 Chronic cough: Secondary | ICD-10-CM

## 2022-02-26 DIAGNOSIS — R0609 Other forms of dyspnea: Secondary | ICD-10-CM

## 2022-02-26 DIAGNOSIS — I77810 Thoracic aortic ectasia: Secondary | ICD-10-CM

## 2022-02-26 DIAGNOSIS — R0683 Snoring: Secondary | ICD-10-CM | POA: Diagnosis not present

## 2022-02-26 LAB — PULMONARY FUNCTION TEST
DL/VA % pred: 107 %
DL/VA: 4.44 ml/min/mmHg/L
DLCO cor % pred: 93 %
DLCO cor: 22.1 ml/min/mmHg
DLCO unc % pred: 93 %
DLCO unc: 22.1 ml/min/mmHg
FEF 25-75 Post: 3.99 L/sec
FEF 25-75 Pre: 4.31 L/sec
FEF2575-%Change-Post: -7 %
FEF2575-%Pred-Post: 142 %
FEF2575-%Pred-Pre: 154 %
FEV1-%Change-Post: 0 %
FEV1-%Pred-Post: 100 %
FEV1-%Pred-Pre: 100 %
FEV1-Post: 3.1 L
FEV1-Pre: 3.09 L
FEV1FVC-%Change-Post: 2 %
FEV1FVC-%Pred-Pre: 108 %
FEV6-%Change-Post: -2 %
FEV6-%Pred-Post: 91 %
FEV6-%Pred-Pre: 94 %
FEV6-Post: 3.51 L
FEV6-Pre: 3.61 L
FEV6FVC-%Pred-Post: 103 %
FEV6FVC-%Pred-Pre: 103 %
FVC-%Change-Post: -2 %
FVC-%Pred-Post: 89 %
FVC-%Pred-Pre: 91 %
FVC-Post: 3.51 L
FVC-Pre: 3.61 L
Post FEV1/FVC ratio: 88 %
Post FEV6/FVC ratio: 100 %
Pre FEV1/FVC ratio: 86 %
Pre FEV6/FVC Ratio: 100 %
RV % pred: 64 %
RV: 1.35 L
TLC % pred: 89 %
TLC: 5.07 L

## 2022-02-26 MED ORDER — PREDNISONE 20 MG PO TABS: 20.0000 mg | ORAL_TABLET | Freq: Every day | ORAL | 0 refills | Status: DC

## 2022-02-26 MED ORDER — BENZONATATE 200 MG PO CAPS: 200.0000 mg | ORAL_CAPSULE | Freq: Three times a day (TID) | ORAL | 1 refills | Status: DC | PRN

## 2022-02-26 MED ORDER — AZITHROMYCIN 250 MG PO TABS: ORAL_TABLET | ORAL | 0 refills | Status: AC

## 2022-02-26 NOTE — Assessment & Plan Note (Signed)
Snoring, daytime sleepiness, restless sleep all suspicious for underlying sleep apnea.  Patient will be set up for a home sleep study.  Patient education was given ?.- discussed how weight can impact sleep and risk for sleep disordered breathing ?- discussed options to assist with weight loss: combination of diet modification, cardiovascular and strength training exercises ?  ?- had an extensive discussion regarding the adverse health consequences related to untreated sleep disordered breathing ?- specifically discussed the risks for hypertension, coronary artery disease, cardiac dysrhythmias, cerebrovascular disease, and diabetes ?- lifestyle modification discussed ?  ?- discussed how sleep disruption can increase risk of accidents, particularly when driving ?- safe driving practices were discussed ?  ?Plan  ?Patient Instructions  ?Set up for home sleep study.  ? ?Follow up with with PCP for aorta  ? ?Continue on TRELEGY 1 puff daily  ?Albuterol inhaler As needed   ?Asthma action plan as discussed.  ? ?ZPack take as directed  ?Delsym 2 tsp Twice daily  As needed  cough/congestion  ?Tessalon Three times a day  As needed   ?Prednisone '20mg'$  daily for 5 days .  ? ?Follow up with Dr. Valeta Harms or Mallary Kreger NP in 6-8 weeks and As needed   ?Please contact office for sooner follow up if symptoms do not improve or worsen or seek emergency care  ? ?  ? ?

## 2022-02-26 NOTE — Patient Instructions (Signed)
Set up for home sleep study.  ? ?Follow up with with PCP for aorta  ? ?Continue on TRELEGY 1 puff daily  ?Albuterol inhaler As needed   ?Asthma action plan as discussed.  ? ?ZPack take as directed  ?Delsym 2 tsp Twice daily  As needed  cough/congestion  ?Tessalon Three times a day  As needed   ?Prednisone '20mg'$  daily for 5 days .  ? ?Follow up with Dr. Valeta Harms or Catori Panozzo NP in 6-8 weeks and As needed   ?Please contact office for sooner follow up if symptoms do not improve or worsen or seek emergency care  ? ?

## 2022-02-26 NOTE — Assessment & Plan Note (Signed)
Dyspnea, decreased stamina and activity tolerance may be secondary to asthma flare.  Patient also has multiple comorbidities with rheumatoid arthritis and is on multiple medications . ?PFTs are normal.  High-resolution CT chest shows no evidence of interstitial lung disease or acute process. ?We will treat for an asthmatic bronchitic exacerbation. ?She will continue on triple therapy maintenance inhaler.  We discussed asthma action plan.  She is continue with activities as tolerated. ?If symptoms do not improve could consider referral to cardiology. ? ?Plan  ?Patient Instructions  ?Set up for home sleep study.  ? ?Follow up with with PCP for aorta  ? ?Continue on TRELEGY 1 puff daily  ?Albuterol inhaler As needed   ?Asthma action plan as discussed.  ? ?ZPack take as directed  ?Delsym 2 tsp Twice daily  As needed  cough/congestion  ?Tessalon Three times a day  As needed   ?Prednisone '20mg'$  daily for 5 days .  ? ?Follow up with Dr. Valeta Harms or Resean Brander NP in 6-8 weeks and As needed   ?Please contact office for sooner follow up if symptoms do not improve or worsen or seek emergency care  ? ?  ? ?

## 2022-02-26 NOTE — Assessment & Plan Note (Signed)
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  Patient is to follow-up primary care provider for ongoing yearly monitoring. ?

## 2022-02-26 NOTE — Progress Notes (Signed)
? ?'@Patient'$  ID: Nicole Waller, female    DOB: 09/04/1966, 56 y.o.   MRN: [id number] ? ?Chief Complaint  ?Patient presents with  ? Follow-up  ? ? ?Referring provider: ?Unk Pinto, MD ? ?HPI: ?56 year old female never smoker seen for pulmonary consult February 07, 2022 for cough and shortness of breath and asthma ?Previous patient of Dr. Melvyn Novas ?Medical history significant for rheumatoid arthritis previously on methotrexate.  Current treatments are Arava and Humira. ?History of traumatic brain injury in 2014 , she works on a horse farm.  ? ?TEST/EVENTS :  ? ?02/26/2022 Follow up : Asthma, shortness of breath, cough ?Patient presents for 2-week follow-up.  Patient was seen last visit for pulmonary consult.  Patient has underlying asthma.  She complained of ongoing cough and shortness of breath.  She also has underlying rheumatoid arthritis.  On Humira and Arava.  Patient was set up for a high-resolution CT chest that showed no evidence of interstitial lung disease.  No acute process.  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 ?PFTs done today shows normal lung function with no airflow obstruction or restriction.  Please note that patient did take Trelegy this morning prior to PFTs.  FEV1 100%.,  Ratio 88, FVC 89%.,  DLCO 93%. ?Exercises 3 days a week doing [employer].  Over the last year.  Patient has noticed that her endurance has not been as good.  She is very active.  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  But just has fatigue low energy body aches.  She has snoring, restless sleep teeth grinding has to wear night guard at nighttime.  Feels that she does not rested.  Patient says she does feel that Trelegy works.  However over the last several weeks has had productive cough with thick green mucus intermittent wheezing. ? ?Patient does have attention deficit disorder is on amphetamine.  She also takes Cymbalta for depression anxiety.  Has been on these medicines  for years. ? ? ? ? ? ?Allergies  ?Allergen Reactions  ? Accolate [Zafirlukast]   ?  GI Upset  ? Codeine   ?  REACTION: nausea and vomiting  ? Lyrica [Pregabalin]   ?  Fatigue  ? Oxycodone   ?  nauseated  ? Probiotic [Acidophilus]   ?  Headache  ? Tape   ?  Causes redness, and skin gets raw!  ? ? ?Immunization History  ?Administered Date(s) Administered  ? Influenza Split 08/11/2014, 08/19/2017  ? Influenza Whole 08/11/2012  ? Influenza,inj,Quad PF,6+ Mos 08/19/2017, 08/11/2021  ? PFIZER(Purple Top)SARS-COV-2 Vaccination 01/18/2020, 02/08/2020  ? PPD Test 04/16/2016, 05/12/2017, 05/26/2018, 06/23/2019, 06/26/2020, 07/05/2021  ? Pneumococcal Conjugate-13 10/09/2017  ? Pneumococcal Polysaccharide-23 12/04/2017  ? Pneumococcal-Unspecified 11/11/2006  ? Td 11/11/2006  ? Tdap 05/12/2017  ? ? ?Past Medical History:  ?Diagnosis Date  ? Abdominal pain   ? in past  ? Abnormal chest CT   ? in past  ? Abnormal EKG   ? Anxiety   ? since head injury  ? Chronic cough   ? Chronic headache   ? Concussion   ? past injury from horse  ? Dyspnea   ? Exercise-induced asthma   ? Head injury 04/2013  ? due to horse injury/ hit backwards by horse head!  ? History of nephrolithiasis   ? Hx of migraines   ? Post-operative nausea and vomiting   ? Pulmonary nodule 2011  ? Rosacea   ? ? ?Tobacco  History: ?Social History  ? ?Tobacco Use  ?Smoking Status Never  ? Passive exposure: Past  ?Smokeless Tobacco Never  ? ?Counseling given: Not Answered ? ? ?Outpatient Medications Prior to Visit  ?Medication Sig Dispense Refill  ? Adalimumab (HUMIRA PEN) 40 MG/0.4ML PNKT 0.4 ml    ? albuterol (VENTOLIN HFA) 108 (90 Base) MCG/ACT inhaler USE 2 INHALATIONS 15 MINUTES APART EVERY 4 HOURS TO RESCUE ASTHMA 18 each 11  ? Amphetamine ER 9.4 MG TBED Take 1 tablet by mouth daily.    ? aspirin 81 MG tablet Take 81 mg by mouth. Take one pill every 4 days    ? B Complex Vitamins (B COMPLEX-B12) TABS Take 1 tablet by mouth daily.    ? Calcium 600-200 MG-UNIT per  tablet Take 1 tablet by mouth every other day. Every three days    ? cetirizine (ZYRTEC) 10 MG tablet Take 10 mg by mouth daily.    ? Cholecalciferol (VITAMIN D) 125 MCG (5000 UT) CAPS Take 1 capsule by mouth daily.    ? CHOLINE PO Take 1 tablet by mouth daily.    ? Coenzyme Q10 (COQ10) 100 MG CAPS Take by mouth daily.    ? DULoxetine (CYMBALTA) 30 MG capsule Take 1 capsule (30 mg total) by mouth daily. Patient needs appt for additional refills. Call office to schedule. 30 capsule 0  ? IMITREX 100 MG tablet TAKE 1 TABLET IMMEDIATELY AT ONSET OF MIGRAINE & MAY REPEAT ONCE IN 2 HOURS (MAX 2 TABS /24 HOURS). 9 tablet 3  ? leflunomide (ARAVA) 20 MG tablet 1 tablet    ? levothyroxine (SYNTHROID) 50 MCG tablet 1 tablet in the morning on an empty stomach    ? MAGNESIUM GLYCINATE PLUS PO Take 400 mg by mouth 2 (two) times daily.    ? montelukast (SINGULAIR) 10 MG tablet Take 1 tablet Daily for Allergies 90 tablet 3  ? Multiple Vitamin (MULTIVITAMIN) capsule Take 1 capsule by mouth daily.      ? NON FORMULARY Metro lotion prn for rosecea    ? Omega-3 Fatty Acids (FISH OIL) 1000 MG CAPS Take 1,000 mg by mouth daily.    ? OVER THE COUNTER MEDICATION OTC Potassium 2 tablets daily.    ? TRELEGY ELLIPTA 100-62.5-25 MCG/ACT AEPB INHALE 1 PUFF BY MOUTH DAILY 60 each 11  ? valACYclovir (VALTREX) 500 MG tablet Take 500 mg by mouth daily as needed (for outbreak).     ? zinc gluconate 50 MG tablet Take 50 mg by mouth daily.    ? ?No facility-administered medications prior to visit.  ? ? ? ?Review of Systems:  ? ?Constitutional:   No  weight loss, night sweats,  Fevers, chills, ?+ We will take care of that this fatigue, or  lassitude. ? ?HEENT:   No headaches,  Difficulty swallowing,  Tooth/dental problems, or  Sore throat,  ?              No sneezing, itching, ear ache, nasal congestion, post nasal drip,  ? ?CV:  No chest pain,  Orthopnea, PND, swelling in lower extremities, anasarca, dizziness, palpitations, syncope.  ? ?GI  No  heartburn, indigestion, abdominal pain, nausea, vomiting, diarrhea, change in bowel habits, loss of appetite, bloody stools.  ? ?Resp:   No chest wall deformity ? ?Skin: no rash or lesions. ? ?GU: no dysuria, change in color of urine, no urgency or frequency.  No flank pain, no hematuria  ? ?MS:  No joint pain or swelling.  No decreased range of motion.  No back pain. ? ? ? ?Physical Exam ? ?BP 112/70 (BP Location: Left Arm, Patient Position: Sitting, Cuff Size: Normal)   Pulse 95   Temp 98.2 ?F (36.8 ?C) (Oral)   Ht '5\' 9"'$  (1.753 m)   Wt 161 lb 9.6 oz (73.3 kg)   SpO2 98%   BMI 23.86 kg/m?  ? ?GEN: A/Ox3; pleasant , NAD, well nourished  ?  ?HEENT:  [place]/AT, , NOSE-clear, THROAT-clear, no lesions, no postnasal drip or exudate noted.  ?CLASS 2-3 MP airway  ? ?NECK:  Supple w/ fair ROM; no JVD; normal carotid impulses w/o bruits; no thyromegaly or nodules palpated; no lymphadenopathy.   ? ?RESP  Clear  P & A; w/o, wheezes/ rales/ or rhonchi. no accessory muscle use, no dullness to percussion ? ?CARD:  RRR, no m/r/g, no peripheral edema, pulses intact, no cyanosis or clubbing. ? ?GI:   Soft & nt; nml bowel sounds; no organomegaly or masses detected.  ? ?Musco: Warm bil, no deformities or joint swelling noted.  ? ?Neuro: alert, no focal deficits noted.   ? ?Skin: Warm, no lesions or rashes ? ? ? ?Lab Results: ? ? ? ?BNP ?No results found for: BNP ? ?ProBNP ?No results found for: PROBNP ? ?Imaging: ?CT CHEST HIGH RESOLUTION ? ?Result Date: 02/24/2022 ?CLINICAL DATA:  56 year old female with history of persistent cough and shortness of breath. EXAM: CT CHEST WITHOUT CONTRAST TECHNIQUE: Multidetector CT imaging of the chest was performed following the standard protocol without intravenous contrast. High resolution imaging of the lungs, as well as inspiratory and expiratory imaging, was performed. RADIATION DOSE REDUCTION: This exam was performed according to the departmental dose-optimization program which includes  automated exposure control, adjustment of the mA and/or kV according to patient size and/or use of iterative reconstruction technique. COMPARISON:  Chest CT 07/03/2011. FINDINGS: Cardiovascular: Heart size is normal. There

## 2022-02-26 NOTE — Assessment & Plan Note (Signed)
Suspect asthma flare.  We will treat with empiric antibiotics and steroids.  CT chest shows no evidence of interstitial lung disease.  No acute processes noted.  Would continue on triple therapy inhaler.  Trigger prevention.  We will add in cough control with Delsym and Tessalon. ?Pulmonary function testing shows normal lung function. ? ?Plan  ?Patient Instructions  ?Set up for home sleep study.  ? ?Follow up with with PCP for aorta  ? ?Continue on TRELEGY 1 puff daily  ?Albuterol inhaler As needed   ?Asthma action plan as discussed.  ? ?ZPack take as directed  ?Delsym 2 tsp Twice daily  As needed  cough/congestion  ?Tessalon Three times a day  As needed   ?Prednisone '20mg'$  daily for 5 days .  ? ?Follow up with Dr. Valeta Harms or Meredyth Hornung NP in 6-8 weeks and As needed   ?Please contact office for sooner follow up if symptoms do not improve or worsen or seek emergency care  ? ?  ? ?

## 2022-02-26 NOTE — Progress Notes (Signed)
PFT done today. 

## 2022-03-13 ENCOUNTER — Other Ambulatory Visit: Payer: Self-pay | Admitting: Internal Medicine

## 2022-04-05 ENCOUNTER — Telehealth: Payer: Self-pay | Admitting: *Deleted

## 2022-04-05 NOTE — Telephone Encounter (Signed)
Called and spoke with patient since her HST has not been done yet and this is the reason for f/u.  She said she had talked to her sister a nd she also has the issue with her aorta and wanted to know about genetic testing.  She inquired about who would handle the testing for [place].  I let her know I would talke with Tammy and once I had an answer I would call her back.  I spoke with Tammy and she stated that her PCP can do the testing for [place].  I advised her that I would cancel her appointment for Tuesday and have her call us after she has the HST.  She agreed with plan.  I called patient and advised her that she can f/u with pcp regarding genetic testing for [place] and that she can schedule a f/u after she has the HST and returns the test kit.  She verbalized understanding.  Nothing further needed.

## 2022-04-09 ENCOUNTER — Ambulatory Visit: Payer: 59 | Admitting: Adult Health

## 2022-04-15 ENCOUNTER — Other Ambulatory Visit: Payer: Self-pay | Admitting: Adult Health

## 2022-04-16 ENCOUNTER — Ambulatory Visit: Payer: 59

## 2022-04-16 DIAGNOSIS — R0683 Snoring: Secondary | ICD-10-CM

## 2022-04-22 ENCOUNTER — Ambulatory Visit: Payer: 59

## 2022-04-22 DIAGNOSIS — R0683 Snoring: Secondary | ICD-10-CM | POA: Diagnosis not present

## 2022-04-23 DIAGNOSIS — R0683 Snoring: Secondary | ICD-10-CM | POA: Diagnosis not present

## 2022-04-27 ENCOUNTER — Encounter: Payer: Self-pay | Admitting: Internal Medicine

## 2022-04-30 ENCOUNTER — Other Ambulatory Visit: Payer: Self-pay | Admitting: Internal Medicine

## 2022-04-30 DIAGNOSIS — Q676 Pectus excavatum: Secondary | ICD-10-CM

## 2022-04-30 DIAGNOSIS — I77810 Thoracic aortic ectasia: Secondary | ICD-10-CM

## 2022-05-01 ENCOUNTER — Encounter: Payer: Self-pay | Admitting: Adult Health

## 2022-05-01 ENCOUNTER — Ambulatory Visit: Payer: 59 | Admitting: Adult Health

## 2022-05-01 DIAGNOSIS — J4530 Mild persistent asthma, uncomplicated: Secondary | ICD-10-CM

## 2022-05-01 DIAGNOSIS — J309 Allergic rhinitis, unspecified: Secondary | ICD-10-CM | POA: Diagnosis not present

## 2022-05-01 DIAGNOSIS — R0683 Snoring: Secondary | ICD-10-CM | POA: Diagnosis not present

## 2022-05-01 DIAGNOSIS — I77810 Thoracic aortic ectasia: Secondary | ICD-10-CM | POA: Diagnosis not present

## 2022-05-01 NOTE — Patient Instructions (Signed)
Follow up with with PCP for aorta   Continue on TRELEGY 1 puff daily, rinse after use.  Albuterol inhaler As needed   Asthma action plan as discussed.   Zyrtec '10mg'$  daily  Saline nasal rinses As needed    Follow up with Dr. Valeta Harms or Milessa Hogan NP 3 months and As needed   Please contact office for sooner follow up if symptoms do not improve or worsen or seek emergency care

## 2022-05-01 NOTE — Progress Notes (Signed)
$'@Patient'q$  ID: Nicole Waller, female    DOB: 09-12-1966, 56 y.o.   MRN: [id number]  Chief Complaint  Patient presents with   Follow-up    Referring provider: Unk Pinto, MD  HPI: 56 year old female never smoker seen in consult February 07, 2022 for Asthma Former patient of Dr. Melvyn Novas Medical history significant for rheumatoid arthritis previously on methotrexate.  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  TEST/EVENTS :  PFTs done 02/26/22 shows normal lung function with no airflow obstruction or restriction.  FEV1 100%.,  Ratio 88, FVC 89%.,  DLCO 93%. (Took Trelegy prior to test)   02/22/22 High-resolution CT chest that showed no evidence of interstitial lung disease.  No acute process.  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   05/01/2022 Follow up ; Asthma , Sleep study results  Patient returns for a 71-monthfollow-up.  Patient was seen in March 2023 for evaluation of asthma with cough and shortness of breath.  Patient has underlying rheumatoid arthritis.  She was set up for high-resolution CT chest that was negative for interstitial lung disease.  Pulmonary function testing showed normal lung function however Trelegy was taken prior to her test.  Patient says overall breathing is doing better.  She is feeling better during the daytime with a little bit more energy.  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  Does have some intermittent nasal congestion postnasal drainage and intermittent cough.  Feels that it seems to be weather related. She remains on Zyrtec daily. Last visit patient had upper respiratory infection.  She was given a Z-Pak.  Patient says symptoms did improve.  Last visit patient was having some  intermittent snoring grinding of her teeth.  And not feeling rested. She was set up for home sleep study that was completed on April 22, 2022 that showed no significant sleep apnea with AHI at 3.9/hour and SPO2 low at 87%.  We discussed her sleep study results.  We discussed treatment options for snoring such as a oral appliance referral.  Patient says she wants to hold off at this time.  We discussed healthy sleep regimen. She says she is actually been feeling better recently and feels that she is sleeping better.  And not as tired during the daytime   Allergies  Allergen Reactions   Accolate [Zafirlukast]     GI Upset   Codeine     REACTION: nausea and vomiting   Lyrica [Pregabalin]     Fatigue   Oxycodone     nauseated   Probiotic [Acidophilus]     Headache   Tape     Causes redness, and skin gets raw!    Immunization History  Administered Date(s) Administered   Influenza Split 08/11/2014, 08/19/2017   Influenza Whole 08/11/2012   Influenza,inj,Quad PF,6+ Mos 08/19/2017, 08/11/2021   PFIZER(Purple Top)SARS-COV-2 Vaccination 01/18/2020, 02/08/2020   PPD Test 04/16/2016, 05/12/2017, 05/26/2018, 06/23/2019, 06/26/2020, 07/05/2021   Pneumococcal Conjugate-13 10/09/2017   Pneumococcal Polysaccharide-23 12/04/2017   Pneumococcal-Unspecified 11/11/2006   Td 11/11/2006   Tdap 05/12/2017    Past Medical History:  Diagnosis Date   Abdominal pain    in past   Abnormal chest CT    in past  Abnormal EKG    Anxiety    since head injury   Chronic cough    Chronic headache    Concussion    past injury from horse   Dyspnea    Exercise-induced asthma    Head injury 04/2013   due to horse injury/ hit backwards by horse head!   History of nephrolithiasis    Hx of migraines    Post-operative nausea and vomiting    Pulmonary nodule 2011   Rosacea     Tobacco History: Social History   Tobacco Use  Smoking Status Never   Passive exposure: Past  Smokeless Tobacco Never    Counseling given: Not Answered   Outpatient Medications Prior to Visit  Medication Sig Dispense Refill   Adalimumab (HUMIRA PEN) 40 MG/0.4ML PNKT 0.4 ml     albuterol (VENTOLIN HFA) 108 (90 Base) MCG/ACT inhaler USE 2 INHALATIONS 15 MINUTES APART EVERY 4 HOURS TO RESCUE ASTHMA 18 each 11   Amphetamine ER 9.4 MG TBED Take 1 tablet by mouth daily.     aspirin 81 MG tablet Take 81 mg by mouth. Take one pill every 4 days     B Complex Vitamins (B COMPLEX-B12) TABS Take 1 tablet by mouth daily.     Benzonatate (TESSALON PO) Take 100 mg by mouth as needed.     Calcium 600-200 MG-UNIT per tablet Take 1 tablet by mouth every other day. Every three days     cetirizine (ZYRTEC) 10 MG tablet Take 10 mg by mouth daily.     Cholecalciferol (VITAMIN D) 125 MCG (5000 UT) CAPS Take 1 capsule by mouth daily.     CHOLINE PO Take 1 tablet by mouth daily.     Coenzyme Q10 (COQ10) 100 MG CAPS Take by mouth daily.     DULoxetine (CYMBALTA) 30 MG capsule Take 1 capsule (30 mg total) by mouth daily. Patient needs appt for additional refills. Call office to schedule. 30 capsule 0   IMITREX 100 MG tablet TAKE 1 TABLET IMMEDIATELY AT ONSET OF MIGRAINE & MAY REPEAT ONCE IN 2 HOURS (MAX 2 TABS /24 HOURS). 9 tablet 3   leflunomide (ARAVA) 20 MG tablet 1 tablet     levothyroxine (SYNTHROID) 50 MCG tablet 1 tablet in the morning on an empty stomach     MAGNESIUM GLYCINATE PLUS PO Take 400 mg by mouth 2 (two) times daily.     montelukast (SINGULAIR) 10 MG tablet TAKE 1 TABLET BY MOUTH  DAILY FOR ALLERGIES 90 tablet 3   Multiple Vitamin (MULTIVITAMIN) capsule Take 1 capsule by mouth daily.       NON FORMULARY Metro lotion prn for rosecea     Omega-3 Fatty Acids (FISH OIL) 1000 MG CAPS Take 1,000 mg by mouth daily.     OVER THE COUNTER MEDICATION OTC Potassium 2 tablets daily.     TRELEGY ELLIPTA 100-62.5-25 MCG/ACT AEPB INHALE 1 PUFF BY MOUTH DAILY 60 each 11   valACYclovir (VALTREX) 500 MG tablet Take 500 mg by mouth  daily as needed (for outbreak).      zinc gluconate 50 MG tablet Take 50 mg by mouth daily.     predniSONE (DELTASONE) 20 MG tablet Take 1 tablet (20 mg total) by mouth daily with breakfast. (Patient not taking: Reported on 05/01/2022) 5 tablet 0   No facility-administered medications prior to visit.     Review of Systems:   Constitutional:   No  weight loss, night sweats,  Fevers, chills, fatigue, or  lassitude.  HEENT:   No headaches,  Difficulty swallowing,  Tooth/dental problems, or  Sore throat,                No sneezing, itching, ear ache,  +nasal congestion, post nasal drip,   CV:  No chest pain,  Orthopnea, PND, swelling in lower extremities, anasarca, dizziness, palpitations, syncope.   GI  No heartburn, indigestion, abdominal pain, nausea, vomiting, diarrhea, change in bowel habits, loss of appetite, bloody stools.   Resp: No shortness of breath with exertion or at rest.   No wheezing.  No chest wall deformity  Skin: no rash or lesions.  GU: no dysuria, change in color of urine, no urgency or frequency.  No flank pain, no hematuria   MS:  No joint pain or swelling.  No decreased range of motion.  No back pain.    Physical Exam  BP 134/78 (BP Location: Left Arm, Cuff Size: Normal)   Pulse 67   Temp 97.7 F (36.5 C) (Temporal)   Ht '5\' 9"'$  (1.753 m)   Wt 159 lb 9.6 oz (72.4 kg)   SpO2 100%   BMI 23.57 kg/m   GEN: A/Ox3; pleasant , NAD, well nourished    HEENT:  [place]/AT,  NOSE-clear, THROAT-clear, no lesions, no postnasal drip or exudate noted.   NECK:  Supple w/ fair ROM; no JVD; normal carotid impulses w/o bruits; no thyromegaly or nodules palpated; no lymphadenopathy.    RESP  Clear  P & A; w/o, wheezes/ rales/ or rhonchi. no accessory muscle use, no dullness to percussion  CARD:  RRR, no m/r/g, no peripheral edema, pulses intact, no cyanosis or clubbing.  GI:   Soft & nt; nml bowel sounds; no organomegaly or masses detected.   Musco: Warm bil, no  deformities or joint swelling noted.   Neuro: alert, no focal deficits noted.    Skin: Warm, no lesions or rashes    Lab Results:   BNP No results found for: "BNP"  ProBNP No results found for: "PROBNP"  Imaging: No results found.       Latest Ref Rng & Units 02/26/2022    1:37 PM  PFT Results  FVC-Pre L 3.61   FVC-Predicted Pre % 91   FVC-Post L 3.51   FVC-Predicted Post % 89   Pre FEV1/FVC % % 86   Post FEV1/FCV % % 88   FEV1-Pre L 3.09   FEV1-Predicted Pre % 100   FEV1-Post L 3.10   DLCO uncorrected ml/min/mmHg 22.10   DLCO UNC% % 93   DLCO corrected ml/min/mmHg 22.10   DLCO COR %Predicted % 93   DLVA Predicted % 107   TLC L 5.07   TLC % Predicted % 89   RV % Predicted % 64     No results found for: "NITRICOXIDE"      Assessment & Plan:   No problem-specific Assessment & Plan notes found for this encounter.     Rexene Edison, NP 05/01/2022

## 2022-05-02 NOTE — Assessment & Plan Note (Signed)
Improved control on current regimen with Trelegy and Zyrtec.  Continue current regimen  Plan  Patient Instructions  Follow up with with PCP for aorta   Continue on TRELEGY 1 puff daily, rinse after use.  Albuterol inhaler As needed   Asthma action plan as discussed.   Zyrtec '10mg'$  daily  Saline nasal rinses As needed    Follow up with Dr. Valeta Harms or Caralina Nop NP 3 months and As needed   Please contact office for sooner follow up if symptoms do not improve or worsen or seek emergency care

## 2022-05-02 NOTE — Assessment & Plan Note (Signed)
Follow-up primary care provider for ongoing yearly screening

## 2022-05-02 NOTE — Assessment & Plan Note (Signed)
Continue on Zyrtec

## 2022-05-02 NOTE — Assessment & Plan Note (Signed)
Home sleep study shows no significant sleep apnea with AHI at 3.9/hour and SPO2 low at 87%.  We discussed healthy sleep regimen.  Went over treatment options and possible referral to orthodontics for oral appliance.  Patient wants to hold off at this time.  Plan  Patient Instructions  Follow up with with PCP for aorta   Continue on TRELEGY 1 puff daily, rinse after use.  Albuterol inhaler As needed   Asthma action plan as discussed.   Zyrtec '10mg'$  daily  Saline nasal rinses As needed    Follow up with Dr. Valeta Harms or Mayer Vondrak NP 3 months and As needed   Please contact office for sooner follow up if symptoms do not improve or worsen or seek emergency care

## 2022-07-05 ENCOUNTER — Encounter: Payer: 59 | Admitting: Internal Medicine

## 2022-07-26 ENCOUNTER — Other Ambulatory Visit: Payer: Self-pay | Admitting: Internal Medicine

## 2022-08-05 ENCOUNTER — Ambulatory Visit: Payer: 59 | Admitting: Pulmonary Disease

## 2022-08-05 ENCOUNTER — Encounter: Payer: Self-pay | Admitting: Internal Medicine

## 2022-08-05 ENCOUNTER — Encounter: Payer: Self-pay | Admitting: Pulmonary Disease

## 2022-08-05 VITALS — BP 120/80 | HR 83 | Ht 68.5 in | Wt 161.0 lb

## 2022-08-05 DIAGNOSIS — J45991 Cough variant asthma: Secondary | ICD-10-CM

## 2022-08-05 DIAGNOSIS — J4530 Mild persistent asthma, uncomplicated: Secondary | ICD-10-CM

## 2022-08-05 DIAGNOSIS — I77810 Thoracic aortic ectasia: Secondary | ICD-10-CM

## 2022-08-05 NOTE — Progress Notes (Signed)
Annual Screening/Preventative Visit & Comprehensive Evaluation &  Examination  Future Appointments  Date Time Provider [place]  07/05/2021 10:00 AM Unk Pinto, MD [facility] None  07/05/2022  9:00 AM Unk Pinto, MD [facility] None        This very nice 56 y.o. MWF presents for a Screening /Preventative Visit & comprehensive evaluation and management of multiple medical co-morbidities.  Patient has been followed for HTN, HLD, Prediabetes  and Vitamin D Deficiency. 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 She also is treated by [facility]  presumptive for acquired ADD with Adzenys. Neuro psych testing suggested Depression & Dr Tomi Likens referred patient to Psychiatrist - Dr Dianah Field who agreed with the diagnosis of Depression & convenienced the patient to take anti-depressant.            Patient has been followed expectantly for elevated BP.  She has had a borderline elevated BP 122/88  and 140/91 in the past. 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  Patient's BP has been controlled at home and patient denies any cardiac symptoms as chest pain, palpitations, shortness of breath, dizziness or ankle swelling. Today's BP  is at goal -120/70 .  BP Readings from Last 3 Encounters:  06/26/20 118/80  08/25/19 129/87  08/11/19 (!) 140/91         Patient's hyperlipidemia is controlled with diet and medications. Patient denies myalgias or other medication SE's. Last lipids were at goal:  Lab Results  Component Value Date   CHOL 171 06/26/2020   HDL 72 06/26/2020   LDLCALC 81 06/26/2020   TRIG 93 06/26/2020   CHOLHDL 2.4 06/26/2020         Patient has been monitored proactively for glucose intolerance and patient denies reactive hypoglycemic symptoms, visual blurring, diabetic polys or paresthesias.  Last A1c was normal & at goal:  Lab Results  Component Value Date   HGBA1C 5.4 06/23/2019         Finally, patient has history of Vitamin D Deficiency and last Vitamin D was at goal:  Lab Results  Component Value Date   VD25OH 60 06/26/2020     Current Outpatient Medications on File Prior to Visit  Medication Sig   albuterol (VENTOLIN HFA) 108 (90 Base) MCG/ACT inhaler USE 2 INHALATIONS 15 MINUTES APART EVERY 4 HOURS TO RESCUE ASTHMA   Amphetamine ER (ADZENYS XR-ODT) 9.4 MG TBED Take 1 tablet daily.   aspirin 81 MG tablet  Take one pill every 4 days   B Complex Vitamins (B COMPLEX-B12) TABS Take 1 tablet daily.   Calcium 600-200 MG-UNIT per tablet Take 1 tablet every other day. Every three days   cetirizine  10 MG tablet Take 10 mg daily.   VITAMIN D 5000 u Take 1 capsule daily.   CHOLINE  Take 1 tablet  daily.   Coenzyme Q10 100 MG CAPS Take  daily.   DULoxetine 30 MG capsule TAKE 1 CAPSULE EVERY DAY   IMITREX 100 MG tablet MAX 2 TABS /24 HOURS   MAGNESIUM 400 mg  Take  2 times daily.   montelukast 10 MG tablet Take 1 tablet Daily for Allergies   Multiple Vitamin  Take 1 capsule daily.     [place] lotion  prn for rosecea   Omega-3 FISH OIL 1000 MG CAPS Take  daily.    OTC Potassium 2 tablets daily.   SUMAtriptan (IMITREX) 20 MG/ACT nasal spray May repeat in 2 hours if headache persists or recurs.   TRELEGY ELLIPTA 100-62.5-25 MCG/INH AEPB INHALE 1 PUFF INTO THE LUNGS DAILY, THEN 1 PUFF DAILY.   valACYclovir 500 MG tablet Take daily as needed    zinc  50 MG tablet Take daily.    Allergies  Allergen Reactions   Accolate [Zafirlukast]     GI Upset   Codeine     REACTION: nausea and vomiting   Latex Rash   Lyrica [Pregabalin]     Fatigue   Oxycodone     nauseated   Probiotic [Acidophilus]     Headache   Tape     Causes redness, and skin gets raw!    Past Medical History:  Diagnosis Date   Abdominal pain    in past   Abnormal chest CT    in past   Abnormal EKG     Anxiety    since head injury   Chronic cough    Chronic headache    Concussion    past injury from horse   Dyspnea    Exercise-induced asthma    Head injury 04/2013   due to horse injury/ hit backwards by horse head!   History of nephrolithiasis    Hx of migraines    Post-operative nausea and vomiting    Pulmonary nodule 2011   Rosacea     Health Maintenance  Topic Date Due   HIV Screening  Never done   Hepatitis C Screening  Never done   Zoster Vaccines- Shingrix (1 of 2) Never done   PAP SMEAR-Modifier  Never done   MAMMOGRAM  Never done   COVID-19 Vaccine (3 - Pfizer risk series) 03/07/2020   INFLUENZA VACCINE  06/11/2021   TETANUS/TDAP  05/13/2027   COLONOSCOPY  07/09/2027   Pneumococcal Vaccine 64-56 Years old (4 - PPSV23 or PCV20) 10/22/2031   HPV VACCINES  Aged Out    Immunization History  Administered Date(s) Administered   Influenza Split 08/11/2014, 08/19/2017   Influenza Whole 08/11/2012   PFIZER SARS-COV-2 Vacc 01/18/2020, 02/08/2020   PPD Test 05/26/2018, 06/23/2019, 06/26/2020   Pneumococcal -13 10/09/2017   Pneumococcal -23 12/04/2017   Pneumococcal-23 11/11/2006   Td 11/11/2006   Tdap 05/12/2017     Last Colon -  07/08/2017 - Dr Henrene Pastor - Recc 10 yr f/u - due Sept 2028   Last MGM - 06/23/2018 - Breast MRI - Dr Arvella Nigh   Past Surgical History:  Procedure Laterality Date   ELBOW SURGERY Right    2012/ tendon and reattachment left arm in 2018   EYE SURGERY Right    PVD   FOOT SURGERY Left 1991   bunionectomy   SHOULDER SURGERY Bilateral    right-2003/left 2005 due to bone spurs     Family History  Problem Relation Age of Onset   Colon polyps Father    Colitis Father    Heart disease Father    Hypertension Father    Hyperlipidemia Father    Breast cancer Maternal Grandmother    Cancer Maternal Grandmother 52       Breast,uterine,ovarian   Colon polyps Mother    Diabetes Mother    Rheum arthritis Mother    Lupus Mother     Diabetes Paternal Grandfather    Hypertension Sister    Hyperlipidemia Sister      Social History   Tobacco Use  Smoking status: Never   Smokeless tobacco: Never  Vaping Use   Vaping Use: Never used  Substance Use Topics   Alcohol use: No   Drug use: No      ROS Constitutional: Denies fever, chills, weight loss/gain, headaches, insomnia,  night sweats, and change in appetite. Does c/o fatigue. Eyes: Denies redness, blurred vision, diplopia, discharge, itchy, watery eyes.  ENT: Denies discharge, congestion, post nasal drip, epistaxis, sore throat, earache, hearing loss, dental pain, Tinnitus, Vertigo, Sinus pain, snoring.  Cardio: Denies chest pain, palpitations, irregular heartbeat, syncope, dyspnea, diaphoresis, orthopnea, PND, claudication, edema Respiratory: denies cough, dyspnea, DOE, pleurisy, hoarseness, laryngitis, wheezing.  Gastrointestinal: Denies dysphagia, heartburn, reflux, water brash, pain, cramps, nausea, vomiting, bloating, diarrhea, constipation, hematemesis, melena, hematochezia, jaundice, hemorrhoids Genitourinary: Denies dysuria, frequency, urgency, nocturia, hesitancy, discharge, hematuria, flank pain Breast: Breast lumps, nipple discharge, bleeding.  Musculoskeletal: Denies arthralgia, myalgia, stiffness, Jt. Swelling, pain, limp, and strain/sprain. Denies falls. Skin: Denies puritis, rash, hives, warts, acne, eczema, changing in skin lesion Neuro: No weakness, tremor, incoordination, spasms, paresthesia, pain Psychiatric: Denies confusion, memory loss, sensory loss. Denies Depression. Endocrine: Denies change in weight, skin, hair change, nocturia, and paresthesia, diabetic polys, visual blurring, hyper / hypo glycemic episodes.  Heme/Lymph: No excessive bleeding, bruising, enlarged lymph nodes.  Physical Exam  There were no vitals taken for this visit.  General Appearance: Well nourished, well groomed and in no apparent distress.  Eyes: PERRLA, EOMs,  conjunctiva no swelling or erythema, normal fundi and vessels. Sinuses: No frontal/maxillary tenderness ENT/Mouth: EACs patent / TMs  nl. Nares clear without erythema, swelling, mucoid exudates. Oral hygiene is good. No erythema, swelling, or exudate. Tongue normal, non-obstructing. Tonsils not swollen or erythematous. Hearing normal.  Neck: Supple, thyroid not palpable. No bruits, nodes or JVD. Respiratory: Respiratory effort normal.  BS equal and clear bilateral without rales, rhonci, wheezing or stridor. Cardio: Heart sounds are normal with regular rate and rhythm and no murmurs, rubs or gallops. Peripheral pulses are normal and equal bilaterally without edema. No aortic or femoral bruits. Chest: symmetric with normal excursions and percussion. Breasts: Symmetric, without lumps, nipple discharge, retractions, or fibrocystic changes.  Abdomen: Flat, soft with bowel sounds active. Nontender, no guarding, rebound, hernias, masses, or organomegaly.  Lymphatics: Non tender without lymphadenopathy.  Genitourinary:  Musculoskeletal: Full ROM all peripheral extremities, joint stability, 5/5 strength, and normal gait. Skin: Warm and dry without rashes, lesions, cyanosis, clubbing or  ecchymosis.  Neuro: Cranial nerves intact, reflexes equal bilaterally. Normal muscle tone, no cerebellar symptoms. Sensation intact.  Pysch: Alert and oriented X 3, normal affect, Insight and Judgment appropriate.    Assessment and Plan  1. Annual Preventative Screening Examination   2. Labile hypertension  - EKG 12-Lead - Urinalysis, Routine w reflex microscopic - Microalbumin / creatinine urine ratio - [place], RETROPERITNL ABD,  LTD - CBC with Differential/Platelet - COMPLETE METABOLIC PANEL WITH GFR - Magnesium - TSH  3. Hyperlipidemia, mixed  - EKG 12-Lead - [place], RETROPERITNL ABD,  LTD - Lipid panel  4. Abnormal glucose  - EKG 12-Lead - [place], RETROPERITNL ABD,  LTD - Hemoglobin A1c - Insulin,  random  5. Vitamin D deficiency  - VITAMIN D 25 Hydroxy  6. Post concussive syndrome   7. Screening for colorectal cancer  - POC Hemoccult Bld/Stl   8. Screening examination for pulmonary tuberculosis  - TB Skin Test  9. Screening for ischemic heart disease  - EKG 12-Lead  10. FHx: heart disease  - EKG 12-Lead -  [place], RETROPERITNL ABD,  LTD  11. Abnormal EKG  - EKG 12-Lead  12. Screening for AAA (aortic abdominal aneurysm)  - [place], RETROPERITNL ABD,  LTD  13. Current mild episode of major depressive disorder ([place])   14. Fatigue  - Iron, Total/Total Iron Binding Cap - Vitamin B12 - CBC with Differential/Platelet - TSH  15. Medication management  - CBC with Differential/Platelet - COMPLETE METABOLIC PANEL WITH GFR - Magnesium - Lipid panel - TSH - Hemoglobin A1c - Insulin, random - VITAMIN D 25 Hydroxy          Patient was counseled in prudent diet to achieve/maintain BMI less than 25 for weight control, BP monitoring, regular exercise and medications. Discussed med's effects and SE's. Screening labs and tests as requested with regular follow-up as recommended. Over 40 minutes of exam, counseling, chart review and high complex critical decision making was performed.   Kirtland Bouchard, MD

## 2022-08-05 NOTE — Progress Notes (Signed)
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  Here today for evaluation of ongoing cough.  Was seen in 2012 by Dr. Melvyn Novas.  She had PFTs completed at that time.  However just a couple years ago she was diagnosed with rheumatoid arthritis and started on methotrexate.  Was transition from methotrexate to [place] and Humira.  She still has some shortness of breath.  This predominantly seen with exertion.  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  She has some other orthopedic issues has received some steroid injections.  She still uses her Trelegy daily.  Which is helping manage some of her asthma symptoms.  She is not really sure if there is something going on with her lungs or not.  OV 08/05/2022: Here today for follow-up.Patient last seen in the office by Patricia Nettle, NP on 05/01/2022, office note reviewed.  Patient had a home sleep study that was completed in June 2023 which showed no significant sleep apnea AHI of 3.9, SPO2 87%.  Tammy discussed options for oral appliance for snoring.  Recommended for her to continue to take her Trelegy 1 puff once a day as well as albuterol as needed.  Continue Zyrtec.  Overall doing well with her current regimen.  Most of her symptoms have resolved.  She is still active she has planned horse show coming up.    Past Medical History:  Diagnosis Date   Abdominal pain    in past   Abnormal chest CT    in past   Abnormal EKG    Anxiety    since head injury   Chronic cough    Chronic headache    Concussion    past injury from horse   Dyspnea    Exercise-induced asthma    Head injury 04/2013    due to horse injury/ hit backwards by horse head!   History of nephrolithiasis    Hx of migraines    Post-operative nausea and vomiting    Pulmonary nodule 2011   Rosacea      Family History  Problem Relation Age of Onset   Colon polyps Father    Colitis Father    Heart disease Father    Hypertension Father    Hyperlipidemia Father    Breast cancer Maternal Grandmother    Cancer Maternal Grandmother 44       Breast,uterine,ovarian   Colon polyps Mother    Diabetes Mother    Rheum arthritis Mother    Lupus Mother    Diabetes Paternal Grandfather    Hypertension Sister    Hyperlipidemia Sister      Past Surgical History:  Procedure Laterality Date   ELBOW SURGERY Right    2012/ tendon and reattachment left arm in 2018   EYE SURGERY Right    PVD   FOOT SURGERY Left 1991   bunionectomy   SHOULDER SURGERY Bilateral    right-2003/left 2005 due to bone spurs    Social History   Socioeconomic History   Marital status: Married    Spouse name: Nicole Kindred   Number of children: Not  on file   Years of education: Not on file   Highest education level: Not on file  Occupational History   Not on file  Tobacco Use   Smoking status: Never    Passive exposure: Past   Smokeless tobacco: Never  Vaping Use   Vaping Use: Never used  Substance and Sexual Activity   Alcohol use: No   Drug use: No   Sexual activity: Not on file  Other Topics Concern   Not on file  Social History Narrative   Not on file   Social Determinants of Health   Financial Resource Strain: Not on file  Food Insecurity: Not on file  Transportation Needs: Not on file  Physical Activity: Not on file  Stress: Not on file  Social Connections: Not on file  Intimate Partner Violence: Not on file     Allergies  Allergen Reactions   Accolate [Zafirlukast]     GI Upset   Codeine     REACTION: nausea and vomiting   Lyrica [Pregabalin]     Fatigue   Oxycodone     nauseated   Probiotic [Acidophilus]      Headache   Tape     Causes redness, and skin gets raw!     Outpatient Medications Prior to Visit  Medication Sig Dispense Refill   albuterol (VENTOLIN HFA) 108 (90 Base) MCG/ACT inhaler USE 2 INHALATIONS 15 MINUTES APART EVERY 4 HOURS TO RESCUE ASTHMA 18 each 11   cetirizine (ZYRTEC) 10 MG tablet Take 10 mg by mouth daily.     montelukast (SINGULAIR) 10 MG tablet TAKE 1 TABLET BY MOUTH  DAILY FOR ALLERGIES 90 tablet 3   TRELEGY ELLIPTA 100-62.5-25 MCG/ACT AEPB INHALE 1 PUFF BY MOUTH DAILY 60 each 11   Adalimumab (HUMIRA PEN) 40 MG/0.4ML PNKT 0.4 ml     Amphetamine ER 9.4 MG TBED Take 1 tablet by mouth daily.     aspirin 81 MG tablet Take 81 mg by mouth. Take one pill every 4 days     B Complex Vitamins (B COMPLEX-B12) TABS Take 1 tablet by mouth daily.     Calcium 600-200 MG-UNIT per tablet Take 1 tablet by mouth every other day. Every three days     Cholecalciferol (VITAMIN D) 125 MCG (5000 UT) CAPS Take 1 capsule by mouth daily.     CHOLINE PO Take 1 tablet by mouth daily.     Coenzyme Q10 (COQ10) 100 MG CAPS Take by mouth daily.     DULoxetine (CYMBALTA) 30 MG capsule Take 1 capsule (30 mg total) by mouth daily. Patient needs appt for additional refills. Call office to schedule. 30 capsule 0   IMITREX 100 MG tablet TAKE 1 TABLET IMMEDIATELY AT ONSET OF MIGRAINE & MAY REPEAT ONCE IN 2 HOURS (MAX 2 TABS /24 HOURS). 9 tablet 3   leflunomide (ARAVA) 20 MG tablet 1 tablet     MAGNESIUM GLYCINATE PLUS PO Take 400 mg by mouth 2 (two) times daily.     Multiple Vitamin (MULTIVITAMIN) capsule Take 1 capsule by mouth daily.       NON FORMULARY Metro lotion prn for rosecea     NP THYROID 15 MG tablet Take 15 mg by mouth daily.     NP THYROID 30 MG tablet Take 30 mg by mouth daily.     Omega-3 Fatty Acids (FISH OIL) 1000 MG CAPS Take 1,000 mg by mouth daily.     OVER THE COUNTER MEDICATION OTC Potassium 2 tablets daily.  valACYclovir (VALTREX) 500 MG tablet Take 500 mg by mouth daily as needed  (for outbreak).      zinc gluconate 50 MG tablet Take 50 mg by mouth daily.     levothyroxine (SYNTHROID) 50 MCG tablet 1 tablet in the morning on an empty stomach     No facility-administered medications prior to visit.    Review of Systems  Constitutional:  Negative for chills, fever, malaise/fatigue and weight loss.  HENT:  Negative for hearing loss, sore throat and tinnitus.   Eyes:  Negative for blurred vision and double vision.  Respiratory:  Positive for cough and shortness of breath. Negative for hemoptysis, sputum production, wheezing and stridor.   Cardiovascular:  Negative for chest pain, palpitations, orthopnea, leg swelling and PND.  Gastrointestinal:  Negative for abdominal pain, constipation, diarrhea, heartburn, nausea and vomiting.  Genitourinary:  Negative for dysuria, hematuria and urgency.  Musculoskeletal:  Negative for joint pain and myalgias.  Skin:  Negative for itching and rash.  Neurological:  Negative for dizziness, tingling, weakness and headaches.  Endo/Heme/Allergies:  Negative for environmental allergies. Does not bruise/bleed easily.  Psychiatric/Behavioral:  Negative for depression. The patient is not nervous/anxious and does not have insomnia.   All other systems reviewed and are negative.    Objective:  Physical Exam Vitals reviewed.  Constitutional:      General: She is not in acute distress.    Appearance: She is well-developed.  HENT:     Head: Normocephalic and atraumatic.  Eyes:     General: No scleral icterus.    Conjunctiva/sclera: Conjunctivae normal.     Pupils: Pupils are equal, round, and reactive to light.  Neck:     Vascular: No JVD.     Trachea: No tracheal deviation.  Cardiovascular:     Rate and Rhythm: Normal rate and regular rhythm.     Heart sounds: Normal heart sounds. No murmur heard. Pulmonary:     Effort: Pulmonary effort is normal. No tachypnea, accessory muscle usage or respiratory distress.     Breath sounds: No  stridor. No wheezing, rhonchi or rales.  Abdominal:     General: There is no distension.     Palpations: Abdomen is soft.     Tenderness: There is no abdominal tenderness.  Musculoskeletal:        General: No tenderness.     Cervical back: Neck supple.  Lymphadenopathy:     Cervical: No cervical adenopathy.  Skin:    General: Skin is warm and dry.     Capillary Refill: Capillary refill takes less than 2 seconds.     Findings: No rash.  Neurological:     Mental Status: She is alert and oriented to person, place, and time.  Psychiatric:        Behavior: Behavior normal.      Vitals:   08/05/22 1334  BP: 120/80  Pulse: 83  SpO2: 97%  Weight: 161 lb (73 kg)  Height: 5' 8.5" (1.74 m)   97% on RA BMI Readings from Last 3 Encounters:  08/05/22 24.12 kg/m  05/01/22 23.57 kg/m  02/26/22 23.86 kg/m   Wt Readings from Last 3 Encounters:  08/05/22 161 lb (73 kg)  05/01/22 159 lb 9.6 oz (72.4 kg)  02/26/22 161 lb 9.6 oz (73.3 kg)     CBC    Component Value Date/Time   WBC 6.0 07/05/2021 1005   RBC 3.94 07/05/2021 1005   HGB 13.1 07/05/2021 1005   HCT 39.8 07/05/2021 1005  PLT 262 07/05/2021 1005   MCV 101.0 (H) 07/05/2021 1005   MCH 33.2 (H) 07/05/2021 1005   MCHC 32.9 07/05/2021 1005   RDW 12.4 07/05/2021 1005   LYMPHSABS 2,166 07/05/2021 1005   MONOABS 0.4 07/07/2019 1318   EOSABS 132 07/05/2021 1005   BASOSABS 90 07/05/2021 1005     Chest Imaging: CT chest last completed in 2012: Reviewed CT imaging today in the office, small pulmonary nodule no evidence of ILD parenchymal disease. The patient's images have been independently reviewed by me.    Pulmonary Functions Testing Results:    Latest Ref Rng & Units 02/26/2022    1:37 PM  PFT Results  FVC-Pre L 3.61   FVC-Predicted Pre % 91   FVC-Post L 3.51   FVC-Predicted Post % 89   Pre FEV1/FVC % % 86   Post FEV1/FCV % % 88   FEV1-Pre L 3.09   FEV1-Predicted Pre % 100   FEV1-Post L 3.10   DLCO  uncorrected ml/min/mmHg 22.10   DLCO UNC% % 93   DLCO corrected ml/min/mmHg 22.10   DLCO COR %Predicted % 93   DLVA Predicted % 107   TLC L 5.07   TLC % Predicted % 89   RV % Predicted % 64     FeNO:   Pathology:   Echocardiogram:   Heart Catheterization:     Assessment & Plan:     ICD-10-CM   1. 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 Cough variant asthma  J45.991     3. Ectatic thoracic aorta ([place])  I77.810        Discussion:  This is a 56 year old female, past medical history of rheumatoid arthritis on Arava plus Humira, thyroid disease on levothyroxine, asthma on Trelegy.  Cough is improved.  Normal PFTs reviewed these that were completed in April.  Also had HRCT scan of the chest completed in April with no evidence of interstitial lung disease.  She has a mildly enlarged ectatic ascending thoracic aneurysm.  Plan: Has her annual follow-up with her primary care provider Dr. Melford Aase tomorrow. She will need annual CT follow-up regarding her thoracic aneurysm.  She can either have this done with primary care or we can send a referral over to thoracic surgery to have her followed yearly with CTA imaging.  She is going to talk to her PCP tomorrow about this. He is also ordered evaluation for potential underlying Marfan gene evaluation. As for her asthma she is doing well and her cough is much improved. Continue Trelegy daily and as needed albuterol. Patient to follow-up with [place] in 1 year or as needed.    Current Outpatient Medications:    albuterol (VENTOLIN HFA) 108 (90 Base) MCG/ACT inhaler, USE 2 INHALATIONS 15 MINUTES APART EVERY 4 HOURS TO RESCUE ASTHMA, Disp: 18 each, Rfl: 11   cetirizine (ZYRTEC) 10 MG tablet, Take 10 mg by mouth daily., Disp: , Rfl:    montelukast (SINGULAIR) 10 MG tablet, TAKE 1 TABLET BY MOUTH  DAILY FOR ALLERGIES, Disp: 90 tablet, Rfl: 3   TRELEGY ELLIPTA 100-62.5-25 MCG/ACT AEPB, INHALE 1 PUFF BY MOUTH DAILY,  Disp: 60 each, Rfl: 11   Adalimumab (HUMIRA PEN) 40 MG/0.4ML PNKT, 0.4 ml, Disp: , Rfl:    Amphetamine ER 9.4 MG TBED, Take 1 tablet by mouth daily., Disp: , Rfl:    aspirin 81 MG tablet, Take 81 mg by mouth. Take one pill every 4 days, Disp: , Rfl:  B Complex Vitamins (B COMPLEX-B12) TABS, Take 1 tablet by mouth daily., Disp: , Rfl:    Calcium 600-200 MG-UNIT per tablet, Take 1 tablet by mouth every other day. Every three days, Disp: , Rfl:    Cholecalciferol (VITAMIN D) 125 MCG (5000 UT) CAPS, Take 1 capsule by mouth daily., Disp: , Rfl:    CHOLINE PO, Take 1 tablet by mouth daily., Disp: , Rfl:    Coenzyme Q10 (COQ10) 100 MG CAPS, Take by mouth daily., Disp: , Rfl:    DULoxetine (CYMBALTA) 30 MG capsule, Take 1 capsule (30 mg total) by mouth daily. Patient needs appt for additional refills. Call office to schedule., Disp: 30 capsule, Rfl: 0   IMITREX 100 MG tablet, TAKE 1 TABLET IMMEDIATELY AT ONSET OF MIGRAINE & MAY REPEAT ONCE IN 2 HOURS (MAX 2 TABS /24 HOURS)., Disp: 9 tablet, Rfl: 3   leflunomide (ARAVA) 20 MG tablet, 1 tablet, Disp: , Rfl:    MAGNESIUM GLYCINATE PLUS PO, Take 400 mg by mouth 2 (two) times daily., Disp: , Rfl:    Multiple Vitamin (MULTIVITAMIN) capsule, Take 1 capsule by mouth daily.  , Disp: , Rfl:    NON FORMULARY, Metro lotion prn for rosecea, Disp: , Rfl:    NP THYROID 15 MG tablet, Take 15 mg by mouth daily., Disp: , Rfl:    NP THYROID 30 MG tablet, Take 30 mg by mouth daily., Disp: , Rfl:    Omega-3 Fatty Acids (FISH OIL) 1000 MG CAPS, Take 1,000 mg by mouth daily., Disp: , Rfl:    OVER THE COUNTER MEDICATION, OTC Potassium 2 tablets daily., Disp: , Rfl:    valACYclovir (VALTREX) 500 MG tablet, Take 500 mg by mouth daily as needed (for outbreak). , Disp: , Rfl:    zinc gluconate 50 MG tablet, Take 50 mg by mouth daily., Disp: , Rfl:    Garner Nash, DO [facility] 08/05/2022 1:45 PM

## 2022-08-05 NOTE — Patient Instructions (Addendum)
Thank you for visiting Dr. Valeta Harms at [facility]. Today we recommend the following:  Stay on trelegy and prn albuterol   Return in about 1 year (around 08/06/2023) for with Rexene Edison, NP or Dr. Valeta Harms.    Please do your part to reduce the spread of COVID-19.

## 2022-08-06 ENCOUNTER — Ambulatory Visit (INDEPENDENT_AMBULATORY_CARE_PROVIDER_SITE_OTHER): Payer: 59 | Admitting: Internal Medicine

## 2022-08-06 ENCOUNTER — Encounter: Payer: Self-pay | Admitting: Internal Medicine

## 2022-08-06 VITALS — BP 112/76 | HR 73 | Temp 97.9°F | Resp 16 | Ht 68.5 in | Wt 159.4 lb

## 2022-08-06 DIAGNOSIS — Z111 Encounter for screening for respiratory tuberculosis: Secondary | ICD-10-CM

## 2022-08-06 DIAGNOSIS — R0989 Other specified symptoms and signs involving the circulatory and respiratory systems: Secondary | ICD-10-CM

## 2022-08-06 DIAGNOSIS — I700 Atherosclerosis of aorta: Secondary | ICD-10-CM

## 2022-08-06 DIAGNOSIS — Z0000 Encounter for general adult medical examination without abnormal findings: Secondary | ICD-10-CM

## 2022-08-06 DIAGNOSIS — Z136 Encounter for screening for cardiovascular disorders: Secondary | ICD-10-CM

## 2022-08-06 DIAGNOSIS — Z0001 Encounter for general adult medical examination with abnormal findings: Secondary | ICD-10-CM

## 2022-08-06 DIAGNOSIS — E782 Mixed hyperlipidemia: Secondary | ICD-10-CM

## 2022-08-06 DIAGNOSIS — Z79899 Other long term (current) drug therapy: Secondary | ICD-10-CM

## 2022-08-06 DIAGNOSIS — I77810 Thoracic aortic ectasia: Secondary | ICD-10-CM

## 2022-08-06 DIAGNOSIS — R7309 Other abnormal glucose: Secondary | ICD-10-CM

## 2022-08-06 DIAGNOSIS — Q676 Pectus excavatum: Secondary | ICD-10-CM

## 2022-08-06 DIAGNOSIS — F320 Major depressive disorder, single episode, mild: Secondary | ICD-10-CM

## 2022-08-06 DIAGNOSIS — R5383 Other fatigue: Secondary | ICD-10-CM

## 2022-08-06 DIAGNOSIS — E559 Vitamin D deficiency, unspecified: Secondary | ICD-10-CM

## 2022-08-06 DIAGNOSIS — Z8249 Family history of ischemic heart disease and other diseases of the circulatory system: Secondary | ICD-10-CM | POA: Diagnosis not present

## 2022-08-06 DIAGNOSIS — Z1211 Encounter for screening for malignant neoplasm of colon: Secondary | ICD-10-CM

## 2022-08-06 NOTE — Progress Notes (Incomplete)
Annual Screening/Preventative Visit & Comprehensive Evaluation &  Examination  Future Appointments  Date Time Provider [place]  07/05/2021 10:00 AM Unk Pinto, MD [facility] None  07/05/2022  9:00 AM Unk Pinto, MD [facility] None        This very nice 56 y.o. MWF presents for a Screening /Preventative Visit & comprehensive evaluation and management of multiple medical co-morbidities.  Patient has been followed for HTN, HLD, Prediabetes  and Vitamin D Deficiency. 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 She also is treated by [facility]  presumptive for acquired ADD with Adzenys. Neuro psych testing suggested Depression & Dr Tomi Likens referred patient to Psychiatrist - Dr Dianah Field who agreed with the diagnosis of Depression & convenienced the patient to take anti-depressant.            Patient has been followed expectantly for elevated BP.  She has had a borderline elevated BP 122/88  and 140/91 in the past. 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  Patient's BP has been controlled at home and patient denies any cardiac symptoms as chest pain, palpitations, shortness of breath, dizziness or ankle swelling. Today's BP  is at goal -120/70 .  BP Readings from Last 3 Encounters:  06/26/20 118/80  08/25/19 129/87  08/11/19 (!) 140/91         Patient's hyperlipidemia is controlled with diet and medications. Patient denies myalgias or other medication SE's. Last lipids were at goal:  Lab Results  Component Value Date   CHOL 171 06/26/2020   HDL 72 06/26/2020   LDLCALC 81 06/26/2020   TRIG 93 06/26/2020   CHOLHDL 2.4 06/26/2020         Patient has been monitored proactively for glucose intolerance and patient  denies reactive hypoglycemic symptoms, visual blurring, diabetic polys or paresthesias. Last A1c was normal & at goal:  Lab Results  Component Value Date   HGBA1C 5.4 06/23/2019         Finally, patient has history of Vitamin D Deficiency and last Vitamin D was at goal:  Lab Results  Component Value Date   VD25OH 80 06/26/2020     Current Outpatient Medications on File Prior to Visit  Medication Sig  . albuterol (VENTOLIN HFA) 108 (90 Base) MCG/ACT inhaler USE 2 INHALATIONS 15 MINUTES APART EVERY 4 HOURS TO RESCUE ASTHMA  . Amphetamine ER (ADZENYS XR-ODT) 9.4 MG TBED Take 1 tablet daily.  Marland Kitchen aspirin 81 MG tablet  Take one pill every 4 days  . B Complex Vitamins (B COMPLEX-B12) TABS Take 1 tablet daily.  . Calcium 600-200 MG-UNIT per tablet Take 1 tablet every other day. Every three days  . cetirizine  10 MG tablet Take 10 mg daily.  Marland Kitchen VITAMIN D 5000 u Take 1 capsule daily.  . CHOLINE  Take 1 tablet  daily.  . Coenzyme Q10 100 MG CAPS Take  daily.  . DULoxetine 30 MG capsule TAKE 1 CAPSULE EVERY DAY  . IMITREX 100 MG tablet MAX 2 TABS /24 HOURS  . MAGNESIUM 400 mg  Take  2 times daily.  . montelukast 10 MG tablet Take 1 tablet Daily for Allergies  . Multiple Vitamin  Take 1  capsule daily.    . Metro lotion  prn for rosecea  . Omega-3 FISH OIL 1000 MG CAPS Take daily.  .  OTC Potassium 2 tablets daily.  . SUMAtriptan (IMITREX) 20 MG/ACT nasal spray May repeat in 2 hours if headache persists or recurs.  . TRELEGY ELLIPTA 100-62.5-25 MCG/INH AEPB INHALE 1 PUFF INTO THE LUNGS DAILY, THEN 1 PUFF DAILY.  . valACYclovir 500 MG tablet Take daily as needed   . zinc  50 MG tablet Take daily.    Allergies  Allergen Reactions  . Accolate [Zafirlukast]     GI Upset  . Codeine     REACTION: nausea and vomiting  . Latex Rash  . Lyrica [Pregabalin]     Fatigue  . Oxycodone     nauseated  . Probiotic [Acidophilus]     Headache  . Tape     Causes redness, and skin gets raw!     Past Medical History:  Diagnosis Date  . Abdominal pain    in past  . Abnormal chest CT    in past  . Abnormal EKG   . Anxiety    since head injury  . Chronic cough   . Chronic headache   . Concussion    past injury from horse  . Dyspnea   . Exercise-induced asthma   . Head injury 04/2013   due to horse injury/ hit backwards by horse head!  . History of nephrolithiasis   . Hx of migraines   . Post-operative nausea and vomiting   . Pulmonary nodule 2011  . Rosacea     Health Maintenance  Topic Date Due  . HIV Screening  Never done  . Hepatitis C Screening  Never done  . Zoster Vaccines- Shingrix (1 of 2) Never done  . PAP SMEAR-Modifier  Never done  . MAMMOGRAM  Never done  . COVID-19 Vaccine (3 - Pfizer risk series) 03/07/2020  . INFLUENZA VACCINE  06/11/2021  . TETANUS/TDAP  05/13/2027  . COLONOSCOPY  07/09/2027  . Pneumococcal Vaccine 72-11 Years old (4 - PPSV23 or PCV20) 10/22/2031  . HPV VACCINES  Aged Out    Immunization History  Administered Date(s) Administered  . Influenza Split 08/11/2014, 08/19/2017  . Influenza Whole 08/11/2012  . PFIZER SARS-COV-2 Vacc 01/18/2020, 02/08/2020  . PPD Test 05/26/2018, 06/23/2019, 06/26/2020  . Pneumococcal -13 10/09/2017  . Pneumococcal -23 12/04/2017  . Pneumococcal-23 11/11/2006  . Td 11/11/2006  . Tdap 05/12/2017     Last Colon -  07/08/2017 - Dr Henrene Pastor - Recc 10 yr f/u - due Sept 2028   Last MGM - 06/23/2018 - Breast MRI - Dr Arvella Nigh   Past Surgical History:  Procedure Laterality Date  . ELBOW SURGERY Right    2012/ tendon and reattachment left arm in 2018  . EYE SURGERY Right    PVD  . FOOT SURGERY Left 1991   bunionectomy  . SHOULDER SURGERY Bilateral    right-2003/left 2005 due to bone spurs     Family History  Problem Relation Age of Onset  . Colon polyps Father   . Colitis Father   . Heart disease Father   . Hypertension Father   . Hyperlipidemia Father   . Breast cancer Maternal  Grandmother   . Cancer Maternal Grandmother 37       Breast,uterine,ovarian  . Colon polyps Mother   . Diabetes Mother   . Rheum arthritis Mother   . Lupus Mother   . Diabetes Paternal Grandfather   .  Hypertension Sister   . Hyperlipidemia Sister      Social History   Tobacco Use  . Smoking status: Never  . Smokeless tobacco: Never  Vaping Use  . Vaping Use: Never used  Substance Use Topics  . Alcohol use: No  . Drug use: No      ROS Constitutional: Denies fever, chills, weight loss/gain, headaches, insomnia,  night sweats, and change in appetite. Does c/o fatigue. Eyes: Denies redness, blurred vision, diplopia, discharge, itchy, watery eyes.  ENT: Denies discharge, congestion, post nasal drip, epistaxis, sore throat, earache, hearing loss, dental pain, Tinnitus, Vertigo, Sinus pain, snoring.  Cardio: Denies chest pain, palpitations, irregular heartbeat, syncope, dyspnea, diaphoresis, orthopnea, PND, claudication, edema Respiratory: denies cough, dyspnea, DOE, pleurisy, hoarseness, laryngitis, wheezing.  Gastrointestinal: Denies dysphagia, heartburn, reflux, water brash, pain, cramps, nausea, vomiting, bloating, diarrhea, constipation, hematemesis, melena, hematochezia, jaundice, hemorrhoids Genitourinary: Denies dysuria, frequency, urgency, nocturia, hesitancy, discharge, hematuria, flank pain Breast: Breast lumps, nipple discharge, bleeding.  Musculoskeletal: Denies arthralgia, myalgia, stiffness, Jt. Swelling, pain, limp, and strain/sprain. Denies falls. Skin: Denies puritis, rash, hives, warts, acne, eczema, changing in skin lesion Neuro: No weakness, tremor, incoordination, spasms, paresthesia, pain Psychiatric: Denies confusion, memory loss, sensory loss. Denies Depression. Endocrine: Denies change in weight, skin, hair change, nocturia, and paresthesia, diabetic polys, visual blurring, hyper / hypo glycemic episodes.  Heme/Lymph: No excessive bleeding, bruising,  enlarged lymph nodes.  Physical Exam  There were no vitals taken for this visit.  General Appearance: Well nourished, well groomed and in no apparent distress.  Eyes: PERRLA, EOMs, conjunctiva no swelling or erythema, normal fundi and vessels. Sinuses: No frontal/maxillary tenderness ENT/Mouth: EACs patent / TMs  nl. Nares clear without erythema, swelling, mucoid exudates. Oral hygiene is good. No erythema, swelling, or exudate. Tongue normal, non-obstructing. Tonsils not swollen or erythematous. Hearing normal.  Neck: Supple, thyroid not palpable. No bruits, nodes or JVD. Respiratory: Respiratory effort normal.  BS equal and clear bilateral without rales, rhonci, wheezing or stridor. Cardio: Heart sounds are normal with regular rate and rhythm and no murmurs, rubs or gallops. Peripheral pulses are normal and equal bilaterally without edema. No aortic or femoral bruits. Chest: symmetric with normal excursions and percussion. Breasts: Symmetric, without lumps, nipple discharge, retractions, or fibrocystic changes.  Abdomen: Flat, soft with bowel sounds active. Nontender, no guarding, rebound, hernias, masses, or organomegaly.  Lymphatics: Non tender without lymphadenopathy.  Genitourinary:  Musculoskeletal: Full ROM all peripheral extremities, joint stability, 5/5 strength, and normal gait. Skin: Warm and dry without rashes, lesions, cyanosis, clubbing or  ecchymosis.  Neuro: Cranial nerves intact, reflexes equal bilaterally. Normal muscle tone, no cerebellar symptoms. Sensation intact.  Pysch: Alert and oriented X 3, normal affect, Insight and Judgment appropriate.    Assessment and Plan  1. Annual Preventative Screening Examination   2. Labile hypertension  - EKG 12-Lead - Urinalysis, Routine w reflex microscopic - Microalbumin / creatinine urine ratio - [place], RETROPERITNL ABD,  LTD - CBC with Differential/Platelet - COMPLETE METABOLIC PANEL WITH GFR - Magnesium - TSH  3.  Hyperlipidemia, mixed  - EKG 12-Lead - [place], RETROPERITNL ABD,  LTD - Lipid panel  4. Abnormal glucose  - EKG 12-Lead - [place], RETROPERITNL ABD,  LTD - Hemoglobin A1c - Insulin, random  5. Vitamin D deficiency  - VITAMIN D 25 Hydroxy  6. Post concussive syndrome   7. Screening for colorectal cancer  - POC Hemoccult Bld/Stl   8. Screening examination for pulmonary tuberculosis  - TB Skin Test  9. Screening for ischemic heart disease  - EKG 12-Lead  10. FHx: heart disease  - EKG 12-Lead - [place], RETROPERITNL ABD,  LTD  11. Abnormal EKG  - EKG 12-Lead  12. Screening for AAA (aortic abdominal aneurysm)  - [place], RETROPERITNL ABD,  LTD  13. Current mild episode of major depressive disorder ([place])   14. Fatigue  - Iron, Total/Total Iron Binding Cap - Vitamin B12 - CBC with Differential/Platelet - TSH  15. Medication management  - CBC with Differential/Platelet - COMPLETE METABOLIC PANEL WITH GFR - Magnesium - Lipid panel - TSH - Hemoglobin A1c - Insulin, random - VITAMIN D 25 Hydroxy          Patient was counseled in prudent diet to achieve/maintain BMI less than 25 for weight control, BP monitoring, regular exercise and medications. Discussed med's effects and SE's. Screening labs and tests as requested with regular follow-up as recommended. Over 40 minutes of exam, counseling, chart review and high complex critical decision making was performed.   Kirtland Bouchard, MD

## 2022-08-06 NOTE — Patient Instructions (Signed)
Due to recent changes in healthcare laws, you may see the results of your imaging and laboratory studies on MyChart before your provider has had a chance to review them.  We understand that in some cases there may be results that are confusing or concerning to you. Not all laboratory results come back in the same time frame and the provider may be waiting for multiple results in order to interpret others.  Please give [place] 48 hours in order for your provider to thoroughly review all the results before contacting the office for clarification of your results.   ++++++++++++++++++++++++++++++  Vit D  & Vit C 1,000 mg   are recommended to help protect  against the Covid-19 and other Corona viruses.    Also it's recommended  to take  Zinc 50 mg  to help  protect against the Covid-19   and best place to get  is also on [email or web address]  and don't pay more than 6-8 cents /pill !  ================================ Coronavirus (COVID-19) Are you at risk?  Are you at risk for the Coronavirus (COVID-19)?  To be considered HIGH RISK for Coronavirus (COVID-19), you have to meet the following criteria:  Traveled to [place], [place], [place], [place] or [place]; or in the [place] to [place], [place], [place]  or [place]; and have fever, cough, and shortness of breath within the last 2 weeks of travel OR Been in close contact with a person diagnosed with COVID-19 within the last 2 weeks and have  fever, cough,and shortness of breath  IF YOU DO NOT MEET THESE CRITERIA, YOU ARE CONSIDERED LOW RISK FOR COVID-19.  What to do if you are HIGH RISK for COVID-19?  If you are having a medical emergency, call 911. Seek medical care right away. Before you go to a doctor's office, urgent care or emergency department,  call ahead and tell them about your recent travel, contact with someone diagnosed with COVID-19   and your symptoms.  You should receive instructions from your physician's office regarding  next steps of care.  When you arrive at healthcare provider, tell the healthcare staff immediately you have returned from  visiting [place], [place], [place], [place] or [place]; or traveled in the [place] to [place], [place],  [place] or [place] in the last two weeks or you have been in close contact with a person diagnosed with  COVID-19 in the last 2 weeks.   Tell the health care staff about your symptoms: fever, cough and shortness of breath. After you have been seen by a medical provider, you will be either: Tested for (COVID-19) and discharged home on quarantine except to seek medical care if  symptoms worsen, and asked to  Stay home and avoid contact with others until you get your results (4-5 days)  Avoid travel on public transportation if possible (such as bus, train, or airplane) or Sent to the Emergency Department by EMS for evaluation, COVID-19 testing  and  possible admission depending on your condition and test results.  What to do if you are LOW RISK for COVID-19?  Reduce your risk of any infection by using the same precautions used for avoiding the common cold or flu:  Wash your hands often with soap and warm water for at least 20 seconds.  If soap and water are not readily available,  use an alcohol-based hand sanitizer with at least 60% alcohol.  If coughing or sneezing, cover your mouth and nose by coughing  or sneezing into the elbow areas of your shirt or coat,  into a tissue or into your sleeve (not your hands). Avoid shaking hands with others and consider head nods or verbal greetings only. Avoid touching your eyes, nose, or mouth with unwashed hands.  Avoid close contact with people who are sick. Avoid places or events with large numbers of people in one location, like concerts or sporting events. Carefully consider travel plans you have or are making. If you are planning any travel outside or inside the [place], visit the CDC's Travelers' Health webpage for  the latest health notices. If you have some symptoms but not all symptoms, continue to monitor at home and seek medical attention  if your symptoms worsen. If you are having a medical emergency, call 911. >>>>>>>>>>>>>>>>>>>>>>> Preventive Care for Adults  A healthy lifestyle and preventive care can promote health and wellness. Preventive health guidelines for women include the following key practices. A routine yearly physical is a good way to check with your health care provider about your health and preventive screening. It is a chance to share any concerns and updates on your health and to receive a thorough exam. Visit your dentist for a routine exam and preventive care every 6 months. Brush your teeth twice a day and floss once a day. Good oral hygiene prevents tooth decay and gum disease. The frequency of eye exams is based on your age, health, family medical history, use of contact lenses, and other factors. Follow your health care provider's recommendations for frequency of eye exams. Eat a healthy diet. Foods like vegetables, fruits, whole grains, low-fat dairy products, and lean protein foods contain the nutrients you need without too many calories. Decrease your intake of foods high in solid fats, added sugars, and salt. Eat the right amount of calories for you. Get information about a proper diet from your health care provider, if necessary. Regular physical exercise is one of the most important things you can do for your health. Most adults should get at least 150 minutes of moderate-intensity exercise (any activity that increases your heart rate and causes you to sweat) each week. In addition, most adults need muscle-strengthening exercises on 2 or more days a week. Maintain a healthy weight. The body mass index (BMI) is a screening tool to identify possible weight problems. It provides an estimate of body fat based on height and weight. Your health care provider can find your BMI and can  help you achieve or maintain a healthy weight. For adults 20 years and older: A BMI below 18.5 is considered underweight. A BMI of 18.5 to 24.9 is normal. A BMI of 25 to 29.9 is considered overweight. A BMI of 30 and above is considered obese. Maintain normal blood lipids and cholesterol levels by exercising and minimizing your intake of saturated fat. Eat a balanced diet with plenty of fruit and vegetables. Blood tests for lipids and cholesterol should begin at age 43 and be repeated every 5 years. If your lipid or cholesterol levels are high, you are over 50, or you are at high risk for heart disease, you may need your cholesterol levels checked more frequently. Ongoing high lipid and cholesterol levels should be treated with medicines if diet and exercise are not working. If you smoke, find out from your health care provider how to quit. If you do not use tobacco, do not start. Lung cancer screening is recommended for adults aged 11-80 years who are at high risk for developing  lung cancer because of a history of smoking. A yearly low-dose CT scan of the lungs is recommended for people who have at least a 30-pack-year history of smoking and are a current smoker or have quit within the past 15 years. A pack year of smoking is smoking an average of 1 pack of cigarettes a day for 1 year (for example: 1 pack a day for 30 years or 2 packs a day for 15 years). Yearly screening should continue until the smoker has stopped smoking for at least 15 years. Yearly screening should be stopped for people who develop a health problem that would prevent them from having lung cancer treatment. High blood pressure causes heart disease and increases the risk of stroke. Your blood pressure should be checked at least every 1 to 2 years. Ongoing high blood pressure should be treated with medicines if weight loss and exercise do not work. If you are 32-3 years old, ask your health care provider if you should take aspirin to  prevent strokes. Diabetes screening involves taking a blood sample to check your fasting blood sugar level. This should be done once every 3 years, after age 32, if you are within normal weight and without risk factors for diabetes. Testing should be considered at a younger age or be carried out more frequently if you are overweight and have at least 1 risk factor for diabetes. Breast cancer screening is essential preventive care for women. You should practice "breast self-awareness." This means understanding the normal appearance and feel of your breasts and may include breast self-examination. Any changes detected, no matter how small, should be reported to a health care provider. Women in their 45s and 30s should have a clinical breast exam (CBE) by a health care provider as part of a regular health exam every 1 to 3 years. After age 83, women should have a CBE every year. Starting at age 92, women should consider having a mammogram (breast X-ray test) every year. Women who have a family history of breast cancer should talk to their health care provider about genetic screening. Women at a high risk of breast cancer should talk to their health care providers about having an MRI and a mammogram every year. Breast cancer gene (BRCA)-related cancer risk assessment is recommended for women who have family members with BRCA-related cancers. BRCA-related cancers include breast, ovarian, tubal, and peritoneal cancers. Having family members with these cancers may be associated with an increased risk for harmful changes (mutations) in the breast cancer genes BRCA1 and BRCA2. Results of the assessment will determine the need for genetic counseling and BRCA1 and BRCA2 testing. Routine pelvic exams to screen for cancer are no longer recommended for nonpregnant women who are considered low risk for cancer of the pelvic organs (ovaries, uterus, and vagina) and who do not have symptoms. Ask your health care provider if a  screening pelvic exam is right for you. If you have had past treatment for cervical cancer or a condition that could lead to cancer, you need Pap tests and screening for cancer for at least 20 years after your treatment. If Pap tests have been discontinued, your risk factors (such as having a new sexual partner) need to be reassessed to determine if screening should be resumed. Some women have medical problems that increase the chance of getting cervical cancer. In these cases, your health care provider may recommend more frequent screening and Pap tests. Colorectal cancer can be detected and often prevented. Most routine colorectal  cancer screening begins at the age of 10 years and continues through age 19 years. However, your health care provider may recommend screening at an earlier age if you have risk factors for colon cancer. On a yearly basis, your health care provider may provide home test kits to check for hidden blood in the stool. Use of a small camera at the end of a tube, to directly examine the colon (sigmoidoscopy or colonoscopy), can detect the earliest forms of colorectal cancer. Talk to your health care provider about this at age 28, when routine screening begins.  Direct exam of the colon should be repeated every 5-10 years through age 71 years, unless early forms of pre-cancerous polyps or small growths are found. Hepatitis C blood testing is recommended for all people born from [birth year] through 1965 and any individual with known risks for hepatitis C.  Osteoporosis is a disease in which the bones lose minerals and strength with aging. This can result in serious bone fractures or breaks. The risk of osteoporosis can be identified using a bone density scan. Women ages 21 years and over and women at risk for fractures or osteoporosis should discuss screening with their health care providers. Ask your health care provider whether you should take a calcium supplement or vitamin D to reduce the rate  of osteoporosis. Menopause can be associated with physical symptoms and risks. Hormone replacement therapy is available to decrease symptoms and risks. You should talk to your health care provider about whether hormone replacement therapy is right for you. Use sunscreen. Apply sunscreen liberally and repeatedly throughout the day. You should seek shade when your shadow is shorter than you. Protect yourself by wearing long sleeves, pants, a wide-brimmed hat, and sunglasses year round, whenever you are outdoors. Once a month, do a whole body skin exam, using a mirror to look at the skin on your back. Tell your health care provider of new moles, moles that have irregular borders, moles that are larger than a pencil eraser, or moles that have changed in shape or color. Stay current with required vaccines (immunizations). Influenza vaccine. All adults should be immunized every year. Tetanus, diphtheria, and acellular pertussis (Td, Tdap) vaccine. Pregnant women should receive 1 dose of Tdap vaccine during each pregnancy. The dose should be obtained regardless of the length of time since the last dose. Immunization is preferred during the 27th-36th week of gestation. An adult who has not previously received Tdap or who does not know her vaccine status should receive 1 dose of Tdap. This initial dose should be followed by tetanus and diphtheria toxoids (Td) booster doses every 10 years. Adults with an unknown or incomplete history of completing a 3-dose immunization series with Td-containing vaccines should begin or complete a primary immunization series including a Tdap dose. Adults should receive a Td booster every 10 years. Varicella vaccine. An adult without evidence of immunity to varicella should receive 2 doses or a second dose if she has previously received 1 dose. Pregnant females who do not have evidence of immunity should receive the first dose after pregnancy. This first dose should be obtained before  leaving the health care facility. The second dose should be obtained 4-8 weeks after the first dose. Human papillomavirus (HPV) vaccine. Females aged 13-26 years who have not received the vaccine previously should obtain the 3-dose series. The vaccine is not recommended for use in pregnant females. However, pregnancy testing is not needed before receiving a dose. If a female is found to  be pregnant after receiving a dose, no treatment is needed. In that case, the remaining doses should be delayed until after the pregnancy. Immunization is recommended for any person with an immunocompromised condition through the age of 55 years if she did not get any or all doses earlier. During the 3-dose series, the second dose should be obtained 4-8 weeks after the first dose. The third dose should be obtained 24 weeks after the first dose and 16 weeks after the second dose. Zoster vaccine. One dose is recommended for adults aged 15 years or older unless certain conditions are present. Measles, mumps, and rubella (MMR) vaccine. Adults born before [birth year] generally are considered immune to measles and mumps. Adults born in [birth year] or later should have 1 or more doses of MMR vaccine unless there is a contraindication to the vaccine or there is laboratory evidence of immunity to each of the three diseases. A routine second dose of MMR vaccine should be obtained at least 28 days after the first dose for students attending postsecondary schools, health care workers, or international travelers. People who received inactivated measles vaccine or an unknown type of measles vaccine during 1963-1967 should receive 2 doses of MMR vaccine. People who received inactivated mumps vaccine or an unknown type of mumps vaccine before 1979 and are at high risk for mumps infection should consider immunization with 2 doses of MMR vaccine. For females of childbearing age, rubella immunity should be determined. If there is no evidence of immunity, females  who are not pregnant should be vaccinated. If there is no evidence of immunity, females who are pregnant should delay immunization until after pregnancy. Unvaccinated health care workers born before [birth year] who lack laboratory evidence of measles, mumps, or rubella immunity or laboratory confirmation of disease should consider measles and mumps immunization with 2 doses of MMR vaccine or rubella immunization with 1 dose of MMR vaccine. Pneumococcal 13-valent conjugate (PCV13) vaccine. When indicated, a person who is uncertain of her immunization history and has no record of immunization should receive the PCV13 vaccine. An adult aged 4 years or older who has certain medical conditions and has not been previously immunized should receive 1 dose of PCV13 vaccine. This PCV13 should be followed with a dose of pneumococcal polysaccharide (PPSV23) vaccine. The PPSV23 vaccine dose should be obtained at least 1 or more year(s) after the dose of PCV13 vaccine. An adult aged 15 years or older who has certain medical conditions and previously received 1 or more doses of PPSV23 vaccine should receive 1 dose of PCV13. The PCV13 vaccine dose should be obtained 1 or more years after the last PPSV23 vaccine dose.  Pneumococcal polysaccharide (PPSV23) vaccine. When PCV13 is also indicated, PCV13 should be obtained first. All adults aged 9 years and older should be immunized. An adult younger than age 14 years who has certain medical conditions should be immunized. Any person who resides in a nursing home or long-term care facility should be immunized. An adult smoker should be immunized. People with an immunocompromised condition and certain other conditions should receive both PCV13 and PPSV23 vaccines. People with human immunodeficiency virus (HIV) infection should be immunized as soon as possible after diagnosis. Immunization during chemotherapy or radiation therapy should be avoided. Routine use of PPSV23 vaccine is not  recommended for American Indians, [place] Natives, or people younger than 65 years unless there are medical conditions that require PPSV23 vaccine. When indicated, people who have unknown immunization and have no record of immunization should receive  PPSV23 vaccine. One-time revaccination 5 years after the first dose of PPSV23 is recommended for people aged 19-64 years who have chronic kidney failure, nephrotic syndrome, asplenia, or immunocompromised conditions. People who received 1-2 doses of PPSV23 before age 36 years should receive another dose of PPSV23 vaccine at age 52 years or later if at least 5 years have passed since the previous dose. Doses of PPSV23 are not needed for people immunized with PPSV23 at or after age 34 years.  Preventive Services / Frequency  Ages 74 to 1 years Blood pressure check. Lipid and cholesterol check. Lung cancer screening. / Every year if you are aged 53-80 years and have a 30-pack-year history of smoking and currently smoke or have quit within the past 15 years. Yearly screening is stopped once you have quit smoking for at least 15 years or develop a health problem that would prevent you from having lung cancer treatment. Clinical breast exam.** / Every year after age 16 years.  BRCA-related cancer risk assessment.** / For women who have family members with a BRCA-related cancer (breast, ovarian, tubal, or peritoneal cancers). Mammogram.** / Every year beginning at age 26 years and continuing for as long as you are in good health. Consult with your health care provider. Pap test.** / Every 3 years starting at age 19 years through age 52 or 29 years with a history of 3 consecutive normal Pap tests. HPV screening.** / Every 3 years from ages 18 years through ages 4 to 110 years with a history of 3 consecutive normal Pap tests. Fecal occult blood test (FOBT) of stool. / Every year beginning at age 70 years and continuing until age 53 years. You may not need to do this  test if you get a colonoscopy every 10 years. Flexible sigmoidoscopy or colonoscopy.** / Every 5 years for a flexible sigmoidoscopy or every 10 years for a colonoscopy beginning at age 66 years and continuing until age 44 years. Hepatitis C blood test.** / For all people born from [birth year] through 1965 and any individual with known risks for hepatitis C. Skin self-exam. / Monthly. Influenza vaccine. / Every year. Tetanus, diphtheria, and acellular pertussis (Tdap/Td) vaccine.** / Consult your health care provider. Pregnant women should receive 1 dose of Tdap vaccine during each pregnancy. 1 dose of Td every 10 years. Varicella vaccine.** / Consult your health care provider. Pregnant females who do not have evidence of immunity should receive the first dose after pregnancy. Zoster vaccine.** / 1 dose for adults aged 90 years or older. Pneumococcal 13-valent conjugate (PCV13) vaccine.** / Consult your health care provider. Pneumococcal polysaccharide (PPSV23) vaccine.** / 1 to 2 doses if you smoke cigarettes or if you have certain conditions. Meningococcal vaccine.** / Consult your health care provider. Hepatitis A vaccine.** / Consult your health care provider. Hepatitis B vaccine.** / Consult your health care provider. Screening for abdominal aortic aneurysm (AAA)  by ultrasound is recommended for people over 50 who have history of high blood pressure or who are current or former smokers. ++++++++++++++++++ Recommend Adult Low Dose Aspirin or  coated  Aspirin 81 mg daily  To reduce risk of Colon Cancer 40 %,  Skin Cancer 26 % ,  Melanoma 46%  and  Pancreatic cancer 60% +++++++++++++++++++ Vitamin D goal  is between 70-100.  Please make sure that you are taking your Vitamin D as directed.  It is very important as a natural anti-inflammatory  helping hair, skin, and nails, as well as reducing stroke and heart  attack risk.  It helps your bones and helps with mood. It also decreases numerous  cancer risks so please take it as directed.  Low Vit D is associated with a 200-300% higher risk for CANCER  and 200-300% higher risk for HEART   ATTACK  &  STROKE.   .....................................Marland Kitchen It is also associated with higher death rate at younger ages,  autoimmune diseases like Rheumatoid arthritis, Lupus, Multiple Sclerosis.    Also many other serious conditions, like depression, Alzheimer's Dementia, infertility, muscle aches, fatigue, fibromyalgia - just to name a few. ++++++++++++++++++ Recommend the book "The END of DIETING" by Dr Excell Seltzer  & the book "The END of DIABETES " by Dr Excell Seltzer At [email or web address] - get book & Audio CD's    Being diabetic has a  300% increased risk for heart attack, stroke, cancer, and alzheimer- type vascular dementia. It is very important that you work harder with diet by avoiding all foods that are white. Avoid white rice (brown & wild rice is OK), white potatoes (sweetpotatoes in moderation is OK), White bread or wheat bread or anything made out of white flour like bagels, donuts, rolls, buns, biscuits, cakes, pastries, cookies, pizza crust, and pasta (made from white flour & egg whites) - vegetarian pasta or spinach or wheat pasta is OK. Multigrain breads like Arnold's or [employer], or multigrain sandwich thins or flatbreads.  Diet, exercise and weight loss can reverse and cure diabetes in the early stages.  Diet, exercise and weight loss is very important in the control and prevention of complications of diabetes which affects every system in your body, ie. Brain - dementia/stroke, eyes - glaucoma/blindness, heart - heart attack/heart failure, kidneys - dialysis, stomach - gastric paralysis, intestines - malabsorption, nerves - severe painful neuritis, circulation - gangrene & loss of a leg(s), and finally cancer and Alzheimers.    I recommend avoid fried & greasy foods,  sweets/candy, white rice (brown or wild rice or Quinoa is OK), white  potatoes (sweet potatoes are OK) - anything made from white flour - bagels, doughnuts, rolls, buns, biscuits,white and wheat breads, pizza crust and traditional pasta made of white flour & egg white(vegetarian pasta or spinach or wheat pasta is OK).  Multi-grain bread is OK - like multi-grain flat bread or sandwich thins. Avoid alcohol in excess. Exercise is also important.    Eat all the vegetables you want - avoid meat, especially red meat and dairy - especially cheese.  Cheese is the most concentrated form of trans-fats which is the worst thing to clog up our arteries. Veggie cheese is OK which can be found in the fresh produce section at [employer] or [employer] or [employer]  ++++++++++++++++++++++ DASH Eating Plan  DASH stands for "Dietary Approaches to Stop Hypertension."   The DASH eating plan is a healthy eating plan that has been shown to reduce high blood pressure (hypertension). Additional health benefits may include reducing the risk of type 2 diabetes mellitus, heart disease, and stroke. The DASH eating plan may also help with weight loss. WHAT DO I NEED TO KNOW ABOUT THE DASH EATING PLAN? For the DASH eating plan, you will follow these general guidelines: Choose foods with a percent daily value for sodium of less than 5% (as listed on the food label). Use salt-free seasonings or herbs instead of table salt or sea salt. Check with your health care provider or pharmacist before using salt substitutes. Eat lower-sodium products, often labeled as "lower sodium" or "no  salt added." Eat fresh foods. Eat more vegetables, fruits, and low-fat dairy products. Choose whole grains. Look for the word "whole" as the first word in the ingredient list. Choose fish  Limit sweets, desserts, sugars, and sugary drinks. Choose heart-healthy fats. Eat veggie cheese  Eat more home-cooked food and less restaurant, buffet, and fast food. Limit fried foods. Cook foods using methods other than  frying. Limit canned vegetables. If you do use them, rinse them well to decrease the sodium. When eating at a restaurant, ask that your food be prepared with less salt, or no salt if possible.                      WHAT FOODS CAN I EAT? Read Dr Fara Olden Fuhrman's books on The End of Dieting & The End of Diabetes  Grains Whole grain or whole wheat bread. Brown rice. Whole grain or whole wheat pasta. Quinoa, bulgur, and whole grain cereals. Low-sodium cereals. Corn or whole wheat flour tortillas. Whole grain cornbread. Whole grain crackers. Low-sodium crackers.  Vegetables Fresh or frozen vegetables (raw, steamed, roasted, or grilled). Low-sodium or reduced-sodium tomato and vegetable juices. Low-sodium or reduced-sodium tomato sauce and paste. Low-sodium or reduced-sodium canned vegetables.   Fruits All fresh, canned (in natural juice), or frozen fruits.  Protein Products  All fish and seafood.  Dried beans, peas, or lentils. Unsalted nuts and seeds. Unsalted canned beans.  Dairy Low-fat dairy products, such as skim or 1% milk, 2% or reduced-fat cheeses, low-fat ricotta or cottage cheese, or plain low-fat yogurt. Low-sodium or reduced-sodium cheeses.  Fats and Oils Tub margarines without trans fats. Light or reduced-fat mayonnaise and salad dressings (reduced sodium). Avocado. Safflower, olive, or canola oils. Natural peanut or almond butter.  Other Unsalted popcorn and pretzels. The items listed above may not be a complete list of recommended foods or beverages. Contact your dietitian for more options.  ++++++++++++++++++  WHAT FOODS ARE NOT RECOMMENDED? Grains/ White flour or wheat flour White bread. White pasta. White rice. Refined cornbread. Bagels and croissants. Crackers that contain trans fat.  Vegetables  Creamed or fried vegetables. Vegetables in a . Regular canned vegetables. Regular canned tomato sauce and paste. Regular tomato and vegetable juices.  Fruits Dried fruits.  Canned fruit in light or heavy syrup. Fruit juice.  Meat and Other Protein Products Meat in general - RED meat & White meat.  Fatty cuts of meat. Ribs, chicken wings, all processed meats as bacon, sausage, bologna, salami, fatback, hot dogs, bratwurst and packaged luncheon meats.  Dairy Whole or 2% milk, cream, half-and-half, and cream cheese. Whole-fat or sweetened yogurt. Full-fat cheeses or blue cheese. Non-dairy creamers and whipped toppings. Processed cheese, cheese spreads, or cheese curds.  Condiments Onion and garlic salt, seasoned salt, table salt, and sea salt. Canned and packaged gravies. Worcestershire sauce. Tartar sauce. Barbecue sauce. Teriyaki sauce. Soy sauce, including reduced sodium. Steak sauce. Fish sauce. Oyster sauce. Cocktail sauce. Horseradish. Ketchup and mustard. Meat flavorings and tenderizers. Bouillon cubes. Hot sauce. Tabasco sauce. Marinades. Taco seasonings. Relishes.  Fats and Oils Butter, stick margarine, lard, shortening and bacon fat. Coconut, palm kernel, or palm oils. Regular salad dressings.  Pickles and olives. Salted popcorn and pretzels.  The items listed above may not be a complete list of foods and beverages to avoid.

## 2022-08-07 LAB — COMPLETE METABOLIC PANEL WITH GFR
AG Ratio: 2 (calc) (ref 1.0–2.5)
ALT: 30 U/L — ABNORMAL HIGH (ref 6–29)
AST: 24 U/L (ref 10–35)
Albumin: 4.7 g/dL (ref 3.6–5.1)
Alkaline phosphatase (APISO): 91 U/L (ref 37–153)
BUN: 20 mg/dL (ref 7–25)
CO2: 28 mmol/L (ref 20–32)
Calcium: 10.1 mg/dL (ref 8.6–10.4)
Chloride: 104 mmol/L (ref 98–110)
Creat: 0.79 mg/dL (ref 0.50–1.03)
Globulin: 2.3 g/dL (calc) (ref 1.9–3.7)
Glucose, Bld: 97 mg/dL (ref 65–99)
Potassium: 4.2 mmol/L (ref 3.5–5.3)
Sodium: 139 mmol/L (ref 135–146)
Total Bilirubin: 0.4 mg/dL (ref 0.2–1.2)
Total Protein: 7 g/dL (ref 6.1–8.1)
eGFR: 88 mL/min/{1.73_m2} (ref >=60)

## 2022-08-07 LAB — MICROALBUMIN / CREATININE URINE RATIO
Creatinine, Urine: 58 mg/dL (ref 20–275)
Microalb Creat Ratio: 5 mcg/mg creat (ref <30)
Microalb, Ur: 0.3 mg/dL

## 2022-08-07 LAB — CBC WITH DIFFERENTIAL/PLATELET
Absolute Monocytes: 632 cells/uL (ref 200–950)
Basophils Absolute: 89 cells/uL (ref 0–200)
Basophils Relative: 1.1 %
Eosinophils Absolute: 16 cells/uL (ref 15–500)
Eosinophils Relative: 0.2 %
HCT: 35.8 % (ref 35.0–45.0)
Hemoglobin: 12.3 g/dL (ref 11.7–15.5)
Lymphs Abs: 2009 cells/uL (ref 850–3900)
MCH: 33.8 pg — ABNORMAL HIGH (ref 27.0–33.0)
MCHC: 34.4 g/dL (ref 32.0–36.0)
MCV: 98.4 fL (ref 80.0–100.0)
MPV: 10.8 fL (ref 7.5–12.5)
Monocytes Relative: 7.8 %
Neutro Abs: 5354 cells/uL (ref 1500–7800)
Neutrophils Relative %: 66.1 %
Platelets: 239 10*3/uL (ref 140–400)
RBC: 3.64 10*6/uL — ABNORMAL LOW (ref 3.80–5.10)
RDW: 12.6 % (ref 11.0–15.0)
Total Lymphocyte: 24.8 %
WBC: 8.1 10*3/uL (ref 3.8–10.8)

## 2022-08-07 LAB — HEMOGLOBIN A1C
Hgb A1c MFr Bld: 5 % of total Hgb (ref <5.7)
Mean Plasma Glucose: 97 mg/dL
eAG (mmol/L): 5.4 mmol/L

## 2022-08-07 LAB — LIPID PANEL
Cholesterol: 214 mg/dL — ABNORMAL HIGH (ref <200)
HDL: 88 mg/dL (ref >=50)
LDL Cholesterol (Calc): 106 mg/dL (calc) — ABNORMAL HIGH
Non-HDL Cholesterol (Calc): 126 mg/dL (calc) (ref <130)
Total CHOL/HDL Ratio: 2.4 (calc) (ref <5.0)
Triglycerides: 100 mg/dL (ref <150)

## 2022-08-07 LAB — URINALYSIS, ROUTINE W REFLEX MICROSCOPIC
Bilirubin Urine: NEGATIVE
Glucose, UA: NEGATIVE
Hgb urine dipstick: NEGATIVE
Ketones, ur: NEGATIVE
Leukocytes,Ua: NEGATIVE
Nitrite: NEGATIVE
Protein, ur: NEGATIVE
Specific Gravity, Urine: 1.013 (ref 1.001–1.035)
pH: 6 (ref 5.0–8.0)

## 2022-08-07 LAB — VITAMIN D 25 HYDROXY (VIT D DEFICIENCY, FRACTURES): Vit D, 25-Hydroxy: 81 ng/mL (ref 30–100)

## 2022-08-07 LAB — IRON, TOTAL/TOTAL IRON BINDING CAP
%SAT: 24 % (calc) (ref 16–45)
Iron: 79 ug/dL (ref 45–160)
TIBC: 329 mcg/dL (calc) (ref 250–450)

## 2022-08-07 LAB — VITAMIN B12: Vitamin B-12: 937 pg/mL (ref 200–1100)

## 2022-08-07 LAB — INSULIN, RANDOM: Insulin: 8.2 u[IU]/mL

## 2022-08-07 LAB — MAGNESIUM: Magnesium: 2.3 mg/dL (ref 1.5–2.5)

## 2022-08-07 LAB — TSH: TSH: 0.74 mIU/L

## 2022-08-07 NOTE — Progress Notes (Signed)
<><><><><><><><><><><><><><><><><><><><><><><><><><><><><><><><><> <><><><><><><><><><><><><><><><><><><><><><><><><><><><><><><><><> - Test results slightly outside the reference range are not unusual. If there is anything important, I will review this with you,  otherwise it is considered normal test values.  If you have further questions,  please do not hesitate to contact me at the office or via My Chart.  <><><><><><><><><><><><><><><><><><><><><><><><><><><><><><><><><> <><><><><><><><><><><><><><><><><><><><><><><><><><><><><><><><><>  -  Total  Chol =          214  -    Elevated             (  Ideal  or  Goal is less than 180  !  )  & -  Bad / Dangerous LDL  Chol = 106    - also Elevated              (  Ideal  or  Goal is less than 70  !  )   - Cholesterol is too high - Recommend a stricter low cholesterol diet   - Cholesterol only comes from animal sources                                                                          - ie. meat, dairy, egg yolks  - Eat all the vegetables you want.  - Avoid Meat, Avoid Meat,  Avoid Meat                                                                   - especially Red Meat - Beef AND Pork .  - Avoid cheese & dairy - milk & ice cream.     - Cheese is the most concentrated form of trans-fats which                                                                    is the worst thing to clog up our arteries.   - Veggie cheese is OK which can be found in the fresh produce section at                                                                 [employer] or [employer] or [employer] <><><><><><><><><><><><><><><><><><><><><><><><><><><><><><><><><> <><><><><><><><><><><><><><><><><><><><><><><><><><><><><><><><><>  -  Iron & Vitamin B12 levels are both Normal  <><><><><><><><><><><><><><><><><><><><><><><><><><><><><><><><><>  -  A1c - Normal - No Diabetes   - Great   !   <><><><><><><><><><><><><><><><><><><><><><><><><><><><><><><><><>  -  Vitamin D = 81  - Excellent   - Please keep dose same  <><><><><><><><><><><><><><><><><><><><><><><><><><><><><><><><><>  -  All Else - CBC - Kidneys - Electrolytes - Liver -  Magnesium & Thyroid    - all  Normal / OK <><><><><><><><><><><><><><><><><><><><><><><><><><><><><><><><><> <><><><><><><><><><><><><><><><><><><><><><><><><><><><><><><><><>  -  Keep up the Great Work  !   <><><><><><><><><><><><><><><><><><><><><><><><><><><><><><><><><> <><><><><><><><><><><><><><><><><><><><><><><><><><><><><><><><><>

## 2022-09-10 ENCOUNTER — Other Ambulatory Visit: Payer: Self-pay | Admitting: Obstetrics and Gynecology

## 2022-09-10 DIAGNOSIS — Z803 Family history of malignant neoplasm of breast: Secondary | ICD-10-CM

## 2022-09-29 ENCOUNTER — Ambulatory Visit
Admission: RE | Admit: 2022-09-29 | Discharge: 2022-09-29 | Disposition: A | Discharge status: Home / Self Care | Payer: 59 | Source: Ambulatory Visit | Attending: Obstetrics and Gynecology | Admitting: Obstetrics and Gynecology

## 2022-09-29 DIAGNOSIS — Z803 Family history of malignant neoplasm of breast: Secondary | ICD-10-CM

## 2022-09-29 MED ORDER — GADOPICLENOL 0.5 MMOL/ML IV SOLN
7.5000 mL | Freq: Once | INTRAVENOUS | Status: AC | PRN
  Administered 2022-09-29: 7.5 mL via INTRAVENOUS

## 2022-10-24 NOTE — Progress Notes (Signed)
CARDIOLOGY CONSULT NOTE       Patient ID: Nicole Waller MRN: [id number] DOB/AGE: 1966/02/15 56 y.o.  Admit date: (Not on file) Referring Physician: Melford Aase Primary Physician: Unk Pinto, MD Primary Cardiologist: New Reason for Consultation: Aortic Aneurysm  Active Problems:   * No active hospital problems. *   HPI:  56 y.o. 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   History of nephrolithiasis    Hx of migraines    Post-operative nausea and vomiting    Pulmonary nodule 2011   Rosacea     Family History  Problem Relation Age of Onset   Colon polyps Father    Colitis Father    Heart disease Father    Hypertension Father    Hyperlipidemia Father    Breast cancer Maternal Grandmother    Cancer Maternal Grandmother 6       Breast,uterine,ovarian   Colon polyps Mother    Diabetes Mother    Rheum arthritis Mother    Lupus Mother    Diabetes Paternal Grandfather    Hypertension Sister    Hyperlipidemia Sister     Social History   Socioeconomic  History   Marital status: Married    Spouse name: Nicole Kindred   Number of children: Not on file   Years of education: Not on file   Highest education level: Not on file  Occupational History   Not on file  Tobacco Use   Smoking status: Never    Passive exposure: Past   Smokeless tobacco: Never  Vaping Use   Vaping Use: Never used  Substance and Sexual Activity   Alcohol use: No   Drug use: No   Sexual activity: Not on file  Other Topics Concern   Not on file  Social History Narrative   Not on file   Social Determinants of Health   Financial Resource Strain: Not on file  Food Insecurity: Not on file  Transportation Needs: Not on file  Physical Activity: Not on file  Stress: Not on file  Social Connections: Not on file  Intimate Partner Violence: Not on file    Past Surgical History:  Procedure Laterality Date   ELBOW SURGERY Right    2012/ tendon and reattachment left arm in 2018   EYE SURGERY Right    PVD   FOOT SURGERY Left 1991   bunionectomy   SHOULDER SURGERY Bilateral  right-2003/left 2005 due to bone spurs      Current Outpatient Medications:    Adalimumab (HUMIRA PEN) 40 MG/0.4ML PNKT, 0.4 ml, Disp: , Rfl:    albuterol (VENTOLIN HFA) 108 (90 Base) MCG/ACT inhaler, USE 2 INHALATIONS 15 MINUTES APART EVERY 4 HOURS TO RESCUE ASTHMA (Patient taking differently: Inhale 2 puffs into the lungs every 6 (six) hours as needed. USE 2 INHALATIONS 15 MINUTES APART EVERY 4 HOURS TO RESCUE ASTHMA), Disp: 18 each, Rfl: 11   Amphetamine ER 9.4 MG TBED, Take 1 tablet by mouth daily., Disp: , Rfl:    aspirin 81 MG tablet, Take 81 mg by mouth. Take one pill every 4 days, Disp: , Rfl:    B Complex Vitamins (B COMPLEX-B12) TABS, Take 1 tablet by mouth daily., Disp: , Rfl:    Calcium 600-200 MG-UNIT per tablet, Take 1 tablet by mouth every other day. Every three days, Disp: , Rfl:    cetirizine (ZYRTEC) 10 MG tablet, Take 10 mg by mouth daily., Disp: , Rfl:    Cholecalciferol  (VITAMIN D) 125 MCG (5000 UT) CAPS, Take 1 capsule by mouth daily., Disp: , Rfl:    CHOLINE PO, Take 1 tablet by mouth daily., Disp: , Rfl:    Coenzyme Q10 (COQ10) 100 MG CAPS, Take 200 mg by mouth daily., Disp: , Rfl:    DULoxetine (CYMBALTA) 30 MG capsule, Take 1 capsule (30 mg total) by mouth daily. Patient needs appt for additional refills. Call office to schedule. (Patient taking differently: Take 60 mg by mouth every other day. Patient needs appt for additional refills. Call office to schedule.), Disp: 30 capsule, Rfl: 0   estradiol (VIVELLE-DOT) 0.05 MG/24HR patch, Place 1 patch onto the skin 2 (two) times a week., Disp: , Rfl:    IMITREX 100 MG tablet, TAKE 1 TABLET IMMEDIATELY AT ONSET OF MIGRAINE & MAY REPEAT ONCE IN 2 HOURS (MAX 2 TABS /24 HOURS)., Disp: 9 tablet, Rfl: 3   leflunomide (ARAVA) 20 MG tablet, Take 20 mg by mouth daily., Disp: , Rfl:    MAGNESIUM GLYCINATE PLUS PO, Take 400 mg by mouth 2 (two) times daily., Disp: , Rfl:    montelukast (SINGULAIR) 10 MG tablet, TAKE 1 TABLET BY MOUTH  DAILY FOR ALLERGIES, Disp: 90 tablet, Rfl: 3   Multiple Vitamin (MULTIVITAMIN) capsule, Take 1 capsule by mouth daily.  , Disp: , Rfl:    NON FORMULARY, Metro lotion prn for rosecea, Disp: , Rfl:    NP THYROID 15 MG tablet, Take 15 mg by mouth daily., Disp: , Rfl:    NP THYROID 30 MG tablet, Take 30 mg by mouth daily., Disp: , Rfl:    Omega-3 Fatty Acids (FISH OIL) 1000 MG CAPS, Take 1,000 mg by mouth daily., Disp: , Rfl:    OVER THE COUNTER MEDICATION, Take 1 tablet by mouth daily., Disp: , Rfl:    progesterone (PROMETRIUM) 100 MG capsule, Take 100 mg by mouth at bedtime., Disp: , Rfl:    TRELEGY ELLIPTA 100-62.5-25 MCG/ACT AEPB, INHALE 1 PUFF BY MOUTH DAILY, Disp: 60 each, Rfl: 11   valACYclovir (VALTREX) 500 MG tablet, Take 500 mg by mouth daily as needed (for outbreak). , Disp: , Rfl:    zinc gluconate 50 MG tablet, Take 50 mg by mouth daily., Disp: , Rfl:     Physical Exam: Blood  pressure 112/72, pulse 69, height 5' 8.5" (1.74 m), weight 165 lb 12.8 oz (75.2 kg), SpO2 98 %.    Affect appropriate  Healthy:  appears stated age 9: normal Neck supple with no adenopathy JVP normal no bruits no thyromegaly Lungs clear with no wheezing and good diaphragmatic motion Heart:  S1/S2 no murmur, no rub, gallop or click PMI normal Abdomen: benighn, BS positve, no tenderness, no AAA no bruit.  No HSM or HJR Distal pulses intact with no bruits No edema Neuro non-focal Skin warm and dry No muscular weakness   Labs:   Lab Results  Component Value Date   WBC 8.1 08/06/2022   HGB 12.3 08/06/2022   HCT 35.8 08/06/2022   MCV 98.4 08/06/2022   PLT 239 08/06/2022   No results for input(s): "NA", "K", "CL", "CO2", "BUN", "CREATININE", "CALCIUM", "PROT", "BILITOT", "ALKPHOS", "ALT", "AST", "GLUCOSE" in the last 168 hours.  Invalid input(s): "LABALBU" No results found for: "CKTOTAL", "CKMB", "CKMBINDEX", "TROPONINI"  Lab Results  Component Value Date   CHOL 214 (H) 08/06/2022   CHOL 204 (H) 07/05/2021   CHOL 171 06/26/2020   Lab Results  Component Value Date   HDL 88 08/06/2022   HDL 80 07/05/2021   HDL 72 06/26/2020   Lab Results  Component Value Date   LDLCALC 106 (H) 08/06/2022   LDLCALC 105 (H) 07/05/2021   LDLCALC 81 06/26/2020   Lab Results  Component Value Date   TRIG 100 08/06/2022   TRIG 98 07/05/2021   TRIG 93 06/26/2020   Lab Results  Component Value Date   CHOLHDL 2.4 08/06/2022   CHOLHDL 2.6 07/05/2021   CHOLHDL 2.4 06/26/2020   No results found for: "LDLDIRECT"    Radiology: No results found.  EKG: SR rate 59 ICRBBB 08/06/22    ASSESSMENT AND PLAN:   Aortic Ectasia:  minimal with root only 4.0 cm Can do f/u CTA in 2 years TTE to make sure AV is tri cuspid  HTN:  in normal range despite amphetamine use no need for beta blocker RA:  continue humira and Arava  Thyroid:  on replacement TSH normal  COPD continue Trelegy and inhalers  CT chest no ILD ADD:  discussed limited use of amphetamine only uses [employer] when she works    TTE r/o bicuspid valve CTA chest gated in 2 years   Signed: Jenkins Rouge 10/31/2022, 1:01 PM

## 2022-10-31 ENCOUNTER — Ambulatory Visit: Payer: 59 | Attending: Cardiovascular Disease | Admitting: Cardiovascular Disease

## 2022-10-31 ENCOUNTER — Encounter: Payer: Self-pay | Admitting: Cardiovascular Disease

## 2022-10-31 VITALS — BP 112/72 | HR 69 | Ht 68.5 in | Wt 165.8 lb

## 2022-10-31 DIAGNOSIS — F419 Anxiety disorder, unspecified: Secondary | ICD-10-CM

## 2022-10-31 DIAGNOSIS — Q231 Congenital insufficiency of aortic valve: Secondary | ICD-10-CM | POA: Diagnosis not present

## 2022-10-31 DIAGNOSIS — I77810 Thoracic aortic ectasia: Secondary | ICD-10-CM | POA: Diagnosis not present

## 2022-10-31 NOTE — Patient Instructions (Addendum)
Medication Instructions:  Your physician recommends that you continue on your current medications as directed. Please refer to the Current Medication list given to you today.  *If you need a refill on your cardiac medications before your next appointment, please call your pharmacy*  Lab Work: If you have labs (blood work) drawn today and your tests are completely normal, you will receive your results only by: [place] (if you have MyChart) OR A paper copy in the mail If you have any lab test that is abnormal or we need to change your treatment, we will call you to review the results.  Testing/Procedures: Your physician has requested that you have an echocardiogram. Echocardiography is a painless test that uses sound waves to create images of your heart. It provides your doctor with information about the size and shape of your heart and how well your heart's chambers and valves are working. This procedure takes approximately one hour. There are no restrictions for this procedure. Please do NOT wear cologne, perfume, aftershave, or lotions (deodorant is allowed). Please arrive 15 minutes prior to your appointment time.  Follow-Up: At [facility], you and your health needs are our priority.  As part of our continuing mission to provide you with exceptional heart care, we have created designated Provider Care Teams.  These Care Teams include your primary Cardiologist (physician) and Advanced Practice Providers (APPs -  Physician Assistants and Nurse Practitioners) who all work together to provide you with the care you need, when you need it.  We recommend signing up for the patient portal called "MyChart".  Sign up information is provided on this After Visit Summary.  MyChart is used to connect with patients for Virtual Visits (Telemedicine).  Patients are able to view lab/test results, encounter notes, upcoming appointments, etc.  Non-urgent messages can be sent to your provider as  well.   To learn more about what you can do with MyChart, go to [email or web address].    Your next appointment:   1 year(s)  The format for your next appointment:   In Person  Provider:   Jenkins Rouge, MD       Important Information About Sugar

## 2022-11-01 ENCOUNTER — Other Ambulatory Visit: Payer: Self-pay | Admitting: Pulmonary Disease

## 2022-11-01 DIAGNOSIS — J4520 Mild intermittent asthma, uncomplicated: Secondary | ICD-10-CM

## 2022-11-29 ENCOUNTER — Ambulatory Visit (HOSPITAL_COMMUNITY): Payer: 59 | Attending: Cardiovascular Disease

## 2022-11-29 DIAGNOSIS — Q2381 Bicuspid aortic valve: Secondary | ICD-10-CM

## 2022-11-29 DIAGNOSIS — Q231 Congenital insufficiency of aortic valve: Secondary | ICD-10-CM | POA: Insufficient documentation

## 2022-11-29 LAB — ECHOCARDIOGRAM COMPLETE
AR max vel: 2.62 cm2
AV Area VTI: 2.66 cm2
AV Area mean vel: 2.57 cm2
AV Mean grad: 2 mmHg
AV Peak grad: 3.5 mmHg
Ao pk vel: 0.94 m/s
Area-P 1/2: 4.39 cm2
Calc EF: 56.3 %
S' Lateral: 2.7 cm
Single Plane A2C EF: 41.9 %
Single Plane A4C EF: 67.4 %

## 2022-12-12 MED ORDER — TRELEGY ELLIPTA 100-62.5-25 MCG/ACT IN AEPB: 1.0000 | INHALATION_SPRAY | Freq: Every day | RESPIRATORY_TRACT | 11 refills | Status: DC

## 2023-03-18 ENCOUNTER — Other Ambulatory Visit: Payer: Self-pay

## 2023-03-18 MED ORDER — MONTELUKAST SODIUM 10 MG PO TABS: ORAL_TABLET | ORAL | 3 refills | Status: DC

## 2023-07-01 ENCOUNTER — Telehealth: Payer: Self-pay

## 2023-07-01 ENCOUNTER — Telehealth: Payer: Self-pay | Admitting: *Deleted

## 2023-07-01 NOTE — Telephone Encounter (Signed)
Pt has been scheduled for tele pre op appt 07/25/23 @ 9 am. Med rec and consent are done.

## 2023-07-01 NOTE — Telephone Encounter (Signed)
   Name: Nicole Waller  DOB: [birth year]  MRN: [id number]  Primary Cardiologist: Charlton Haws, MD   Preoperative team, please contact this patient and set up a phone call appointment for further preoperative risk assessment. Please obtain consent and complete medication review. Thank you for your help.  I confirm that guidance regarding antiplatelet and oral anticoagulation therapy has been completed and, if necessary, noted below.  Her aspirin is not prescribed by cardiology.  Recommendations for holding aspirin will need to come from prescribing provider.   Ronney Asters, NP 07/01/2023, 10:48 AM [facility]

## 2023-07-01 NOTE — Telephone Encounter (Signed)
   Pre-operative Risk Assessment    Patient Name: Nicole Waller  DOB: May 13, 1966 MRN: [id number]    DATE OF LAST VISIT: 10/31/22 WITH DR. Eden Emms DATE OF NEXT APPT: 12//2024 WITH DR. Eden Emms  Request for Surgical Clearance    Procedure:   RIGHT KNEE SCOPE  Date of Surgery:  Clearance 08/20/23                                 Surgeon:  DR. Ramond Marrow Surgeon's Group or Practice Name:  [facility] Phone number:  [phone] ATTN: Silvestre Mesi EXT [id number] Fax number:  [phone]   Type of Clearance Requested:   - Medical ; ASA    Type of Anesthesia:  General    Additional requests/questions:    Elpidio Anis   07/01/2023, 10:35 AM

## 2023-07-01 NOTE — Telephone Encounter (Signed)
ATC X1 LVM for patient to call the office back. Received Surgical Clearance from [facility]. Patient will need current OV please schedule with Dr. Tonia Brooms

## 2023-07-01 NOTE — Telephone Encounter (Signed)
Pt has been scheduled for tele pre op appt 07/25/23 @ 9 am. Med rec and consent are done.     Patient Consent for Virtual Visit        Nicole Waller has provided verbal consent on 07/01/2023 for a virtual visit (video or telephone).   CONSENT FOR VIRTUAL VISIT FOR:  Nicole Waller  By participating in this virtual visit I agree to the following:  I hereby voluntarily request, consent and authorize [facility] and its employed or contracted physicians, physician assistants, nurse practitioners or other licensed health care professionals (the Practitioner), to provide me with telemedicine health care services (the "Services") as deemed necessary by the treating Practitioner. I acknowledge and consent to receive the Services by the Practitioner via telemedicine. I understand that the telemedicine visit will involve communicating with the Practitioner through live audiovisual communication technology and the disclosure of certain medical information by electronic transmission. I acknowledge that I have been given the opportunity to request an in-person assessment or other available alternative prior to the telemedicine visit and am voluntarily participating in the telemedicine visit.  I understand that I have the right to withhold or withdraw my consent to the use of telemedicine in the course of my care at any time, without affecting my right to future care or treatment, and that the Practitioner or I may terminate the telemedicine visit at any time. I understand that I have the right to inspect all information obtained and/or recorded in the course of the telemedicine visit and may receive copies of available information for a reasonable fee.  I understand that some of the potential risks of receiving the Services via telemedicine include:  Delay or interruption in medical evaluation due to technological equipment failure or disruption; Information transmitted may not be sufficient (e.g.  poor resolution of images) to allow for appropriate medical decision making by the Practitioner; and/or  In rare instances, security protocols could fail, causing a breach of personal health information.  Furthermore, I acknowledge that it is my responsibility to provide information about my medical history, conditions and care that is complete and accurate to the best of my ability. I acknowledge that Practitioner's advice, recommendations, and/or decision may be based on factors not within their control, such as incomplete or inaccurate data provided by me or distortions of diagnostic images or specimens that may result from electronic transmissions. I understand that the practice of medicine is not an exact science and that Practitioner makes no warranties or guarantees regarding treatment outcomes. I acknowledge that a copy of this consent can be made available to me via my patient portal [facility] MyChart), or I can request a printed copy by calling the office of [facility].    I understand that my insurance will be billed for this visit.   I have read or had this consent read to me. I understand the contents of this consent, which adequately explains the benefits and risks of the Services being provided via telemedicine.  I have been provided ample opportunity to ask questions regarding this consent and the Services and have had my questions answered to my satisfaction. I give my informed consent for the services to be provided through the use of telemedicine in my medical care

## 2023-07-25 ENCOUNTER — Ambulatory Visit: Payer: 59 | Attending: Cardiology | Admitting: Nurse Practitioner

## 2023-07-25 DIAGNOSIS — Z01810 Encounter for preprocedural cardiovascular examination: Secondary | ICD-10-CM

## 2023-07-25 NOTE — Progress Notes (Signed)
Virtual Visit via Telephone Note   Because of Nicole Waller's co-morbid illnesses, she is at least at moderate risk for complications without adequate follow up.  This format is felt to be most appropriate for this patient at this time.  The patient did not have access to video technology/had technical difficulties with video requiring transitioning to audio format only (telephone).  All issues noted in this document were discussed and addressed.  No physical exam could be performed with this format.  Please refer to the patient's chart for her consent to telehealth for [facility].  Evaluation Performed:  Preoperative cardiovascular risk assessment _____________   Date:  07/25/2023   Patient ID:  Nicole Waller, DOB 09/12/1966, MRN [id number] Patient Location:  Home Provider location:   Office  Primary Care Provider:  Lucky Cowboy, MD Primary Cardiologist:  Charlton Haws, MD  Chief Complaint / Patient Profile   57 y.o. y/o female with a h/o aortic ectasia, hypertension, hyperlipidemia, prediabetes, hypothyroidism, RA, and ILD who is pending RIGHT KNEE SCOPE on 08/20/2023 with Dr. Ramond Marrow of [facility] and presents today for telephonic preoperative cardiovascular risk assessment.  History of Present Illness    Nicole Waller is a 57 y.o. female who presents via audio/video conferencing for a telehealth visit today.  Pt was last seen in cardiology clinic on 10/31/2022 by Dr. Eden Emms.  At that time ELOYCE Waller was doing well.  The patient is now pending procedure as outlined above. Since her last visit, she has done well from a cardiac standpoint.   She denies chest pain, palpitations, dyspnea, pnd, orthopnea, n, v, dizziness, syncope, edema, weight gain, or early satiety. All other systems reviewed and are otherwise negative except as noted above.   Past Medical History    Past Medical History:  Diagnosis Date   Abdominal pain    in past   Abnormal  chest CT    in past   Abnormal EKG    Anxiety    since head injury   Chronic cough    Chronic headache    Concussion    past injury from horse   Dyspnea    Exercise-induced asthma    Head injury 04/2013   due to horse injury/ hit backwards by horse head!   History of nephrolithiasis    Hx of migraines    Post-operative nausea and vomiting    Pulmonary nodule 2011   Rosacea    Past Surgical History:  Procedure Laterality Date   ELBOW SURGERY Right    2012/ tendon and reattachment left arm in 2018   EYE SURGERY Right    PVD   FOOT SURGERY Left 1991   bunionectomy   SHOULDER SURGERY Bilateral    right-2003/left 2005 due to bone spurs    Allergies  Allergies  Allergen Reactions   Accolate [Zafirlukast]     GI Upset   Codeine     REACTION: nausea and vomiting   Lyrica [Pregabalin]     Fatigue   Oxycodone     nauseated   Probiotic [Acidophilus]     Headache   Tape     Causes redness, and skin gets raw!    Home Medications    Prior to Admission medications   Medication Sig Start Date End Date Taking? Authorizing Provider  Adalimumab (HUMIRA PEN) 40 MG/0.4ML PNKT 0.4 ml    [provider]  albuterol (VENTOLIN HFA) 108 (90 Base) MCG/ACT inhaler INHALE 2 PUFFS BY  MOUTH EVERY 15 MINUTES APART EVERY 4 HOURS TO RESCUE ASTHMA 11/01/22   Icard, Rachel Bo, DO  Amphetamine ER 9.4 MG TBED Take 1 tablet by mouth daily.    [provider]  aspirin 81 MG tablet Take 81 mg by mouth. Take one pill every 4 days    [provider]  B Complex Vitamins (B COMPLEX-B12) TABS Take 1 tablet by mouth daily.    [provider]  Calcium 600-200 MG-UNIT per tablet Take 1 tablet by mouth every other day. Every three days    [provider]  cetirizine (ZYRTEC) 10 MG tablet Take 10 mg by mouth daily.    [provider]  Cholecalciferol (VITAMIN D) 125 MCG (5000 UT) CAPS Take 1 capsule by mouth daily.    [provider]  CHOLINE PO  Take 1 tablet by mouth daily.    [provider]  Coenzyme Q10 (COQ10) 100 MG CAPS Take 200 mg by mouth daily.    [provider]  DULoxetine (CYMBALTA) 30 MG capsule Take 1 capsule (30 mg total) by mouth daily. Patient needs appt for additional refills. Call office to schedule. Patient taking differently: Take 60 mg by mouth every other day. Patient needs appt for additional refills. Call office to schedule. 07/27/21   Monica Becton, MD  estradiol (VIVELLE-DOT) 0.05 MG/24HR patch Place 1 patch onto the skin 2 (two) times a week.    [provider]  Fluticasone-Umeclidin-Vilant (TRELEGY ELLIPTA) 100-62.5-25 MCG/ACT AEPB Inhale 1 puff into the lungs daily. 12/12/22   Parrett, Tammy S, NP  IMITREX 100 MG tablet TAKE 1 TABLET IMMEDIATELY AT ONSET OF MIGRAINE & MAY REPEAT ONCE IN 2 HOURS (MAX 2 TABS /24 HOURS). 04/15/22   Raynelle Dick, NP  leflunomide (ARAVA) 20 MG tablet Take 20 mg by mouth daily.    [provider]  MAGNESIUM GLYCINATE PLUS PO Take 400 mg by mouth 2 (two) times daily.    [provider]  montelukast (SINGULAIR) 10 MG tablet TAKE 1 TABLET BY MOUTH  DAILY FOR ALLERGIES 03/18/23   Raynelle Dick, NP  Multiple Vitamin (MULTIVITAMIN) capsule Take 1 capsule by mouth daily.      [provider]  NON FORMULARY Metro lotion prn for rosecea    [provider]  NP THYROID 15 MG tablet Take 15 mg by mouth daily. 07/25/22   [provider]  NP THYROID 30 MG tablet Take 30 mg by mouth daily. 07/26/22   [provider]  Omega-3 Fatty Acids (FISH OIL) 1000 MG CAPS Take 1,000 mg by mouth daily.    [provider]  OVER THE COUNTER MEDICATION Take 1 tablet by mouth daily.    [provider]  progesterone (PROMETRIUM) 100 MG capsule Take 100 mg by mouth at bedtime.    [provider]  valACYclovir (VALTREX) 500 MG tablet Take 500 mg by mouth daily as needed (for outbreak).     [provider]  zinc gluconate 50 MG tablet Take 50 mg by mouth daily.    [provider]    Physical Exam    Vital Signs:  Jaya C Thresher does not have vital signs available for review today.  Given telephonic nature of communication, physical exam is limited. AAOx3. NAD. Normal affect.  Speech and respirations are unlabored.  Accessory Clinical Findings    None  Assessment & Plan    1.  Preoperative Cardiovascular Risk Assessment:  According to the Revised  Cardiac Risk Index (RCRI), her Perioperative Risk of Major Cardiac Event is (%): 0.4. Her Functional Capacity in METs is: 8.91 according to the Duke Activity Status Index (DASI). Therefore, based on ACC/AHA guidelines, patient would be at acceptable risk for the planned procedure without further cardiovascular testing.  The patient was advised that if she develops new symptoms prior to surgery to contact our office to arrange for a follow-up visit, and she verbalized understanding.  Per office protocol, she may hold Aspirin for 5-7 days prior to procedure. Please resume Aspirin as soon as possible postprocedure, at the discretion of the surgeon.   A copy of this note will be routed to requesting surgeon.  Time:   Today, I have spent 5 minutes with the patient with telehealth technology discussing medical history, symptoms, and management plan.     Joylene Grapes, NP  07/25/2023, 9:21 AM

## 2023-07-30 ENCOUNTER — Encounter: Payer: Self-pay | Admitting: Internal Medicine

## 2023-08-10 NOTE — Progress Notes (Unsigned)
Annual Screening/Preventative Visit & Comprehensive Evaluation &  Examination   Future Appointments  Date Time Provider Department  08/11/2023                cpe          3:00 PM Lucky Cowboy, MD [facility]  08/15/2023  9:30 AM Tonia Brooms, Rachel Bo, DO [facility]  10/23/2023  3:00 PM Wendall Stade, MD [facility]  08/23/2024                cpe  3:00 PM Lucky Cowboy, MD [facility]        This very nice 57 y.o. MWF with HTN, HLD, Prediabetes  and Vitamin D Deficiency  presents for a Screening /Preventative Visit & comprehensive evaluation and management of multiple medical co-morbidities.                                                   Patient recently dx'd  last year with Rheumatoid Arthritis by Azucena Fallen, PA at [facility] & started on  Arava & Humira        Patient's Thyroid functions  (TSH) have been Normal for the last 12 years and for unclear reasons, patient was started on Synthroid  by Dr Talmage Nap about 2 years ago and patient relates last year that she she was switched to NP Thyroid  Armour or Porcine Thyroid extract 45 mg /day.  TSH was normal last Sept 2024.        Patient  had High Resolution Chest CT scan  in 2023 to evaluate for ILD which did show ectasia of the ascending thoracic Aorta (4 cm) and pectus excavatum recommending annual f/u by CTA or MRI. Because of this in conjunction with her dx of RA, patient has requested a referral for genetic testing for Marfan's Syndrome.        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 She also is treated by [facility]  presumptive for acquired ADD with Adzenys. Neuro psych testing suggested Depression & Dr Everlena Cooper referred patient to Psychiatrist - Dr Benjamin Stain who agreed with the diagnosis of Depression & convenienced the patient to take anti-depressant.            Patient has been followed  expectantly for elevated BP.  She has had a borderline elevated BP 122/88  and 140/91 in the past. 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  Patient's BP has been controlled at home and patient denies any cardiac symptoms as chest pain, palpitations, shortness of breath, dizziness or ankle swelling. Today's BP  is at goal -                  .  BP Readings from Last 3 Encounters:  10/31/22 112/72  08/06/22 112/76  08/05/22 120/80         Patient's hyperlipidemia is controlled with diet. Last lipids were near goal :  Lab Results  Component Value Date   CHOL 214 (H) 08/06/2022   HDL 88 08/06/2022   LDLCALC 106 (H) 08/06/2022   TRIG 100 08/06/2022   CHOLHDL 2.4 08/06/2022         Patient has been monitored proactively for glucose intolerance and patient denies reactive hypoglycemic symptoms,  visual blurring, diabetic polys or paresthesias. Last A1c was normal & at goal :  Lab Results  Component Value Date   HGBA1C 5.0 08/06/2022         Finally, patient has history of Vitamin D Deficiency and last Vitamin D was at goal :  Lab Results  Component Value Date   VD25OH 81 08/06/2022       Current Outpatient Medications  Medication Instructions   HUMIRA PEN   40 MG/0.4ML PNKT 0.4 ml   albuterol HFA   inhaler INHALE 2 PUFFS every 4 HRS    Amphetamine ER 9.4 MG  1 tablet Daily   aspirin  81 mg Take one pill every 4 days   B COMPLEX-B12  TABS 1 tablet Daily   Calcium 600-200 MG-UNIT per tablet 1 tablet  Every other day, Every three days   cetirizine  10 mg  Daily   VITAMIN D 5000 u 1 capsule  Daily   CHOLINE  1 tablet   Daily   CoQ10     200 mg Daily   DULoxetine  30 mg Daily   estradiol (VIVELLE-DOT) 0.05 MG/24HR  1 patch  2 times weekly   Fish Oil 1  ,000 mg Daily   TRELEGY ELLIPTA) 100-62.5-25  1  Inhalation, Daily   IMITREX 100 MG tablet For Migraine   leflunomide (ARAVA)   20 mg  Daily   MAGNESIUM  PLUS 400 mg  2 times daily   montelukast 10 MG tablet TAKE 1  TABLET  DAILY    Multiple Vitamin  1 capsule Daily   Metro lotion  prn for rosecea    NP Thyroid   30 mg Daily   NP Thyroid   15 mg Daily   OVER THE COUNTER MEDICATION 1 table Daily   progesterone (PROMETRIUM)  100 mg Daily at bedtime   valACYclovir (VALTREX)500 mg Daily PRN   zinc 50 mg  Daily     Allergies  Allergen Reactions   Accolate [Zafirlukast]     GI Upset   Codeine     REACTION: nausea and vomiting   Latex Rash   Lyrica [Pregabalin]     Fatigue   Oxycodone     nauseated   Probiotic [Acidophilus]     Headache   Tape     Causes redness, and skin gets raw!    Past Medical History:  Diagnosis Date   Abdominal pain    in past   Abnormal chest CT    in past   Abnormal EKG    Anxiety    since head injury   Chronic cough    Chronic headache    Concussion    past injury from horse   Dyspnea    Exercise-induced asthma    Head injury 04/2013   due to horse injury/ hit backwards by horse head!   History of nephrolithiasis    Hx of migraines    Post-operative nausea and vomiting    Pulmonary nodule 2011   Rosacea     Health Maintenance  Topic Date Due   HIV Screening  Never done   Hepatitis C Screening  Never done   Zoster Vaccines- Shingrix (1 of 2) Never done   PAP SMEAR-Modifier  Never done   MAMMOGRAM  Never done   COVID-19 Vaccine (3 - Pfizer risk series) 03/07/2020   INFLUENZA VACCINE  06/11/2021   TETANUS/TDAP  05/13/2027   COLONOSCOPY  07/09/2027   Pneumococcal Vaccine 43-53 Years old (4 -  PPSV23 or PCV20) 10/22/2031   HPV VACCINES  Aged Out    Immunization History  Administered Date(s) Administered   Influenza Split 08/11/2014, 08/19/2017   Influenza Whole 08/11/2012   PFIZER SARS-COV-2 Vacc 01/18/2020, 02/08/2020   PPD Test 05/26/2018, 06/23/2019, 06/26/2020   Pneumococcal -13 10/09/2017   Pneumococcal -23 12/04/2017   Pneumococcal-23 11/11/2006   Td 11/11/2006   Tdap 05/12/2017     Last Colon -  07/08/2017 - Dr Marina Goodell - Recc 10  yr f/u - due Sept 2028   Last MGM - 06/23/2018 - Breast MRI - Dr Richardean Chimera   Past Surgical History:  Procedure Laterality Date   ELBOW SURGERY Right    2012/ tendon and reattachment left arm in 2018   EYE SURGERY Right    PVD   FOOT SURGERY Left 1991   bunionectomy   SHOULDER SURGERY Bilateral    right-2003/left 2005 due to bone spurs     Family History  Problem Relation Age of Onset   Colon polyps Father    Colitis Father    Heart disease Father    Hypertension Father    Hyperlipidemia Father    Breast cancer Maternal Grandmother    Cancer Maternal Grandmother 65       Breast,uterine,ovarian   Colon polyps Mother    Diabetes Mother    Rheum arthritis Mother    Lupus Mother    Diabetes Paternal Grandfather    Hypertension Sister    Hyperlipidemia Sister      Social History   Tobacco Use   Smoking status: Never   Smokeless tobacco: Never  Vaping Use   Vaping Use: Never used  Substance Use Topics   Alcohol use: No   Drug use: No      ROS Constitutional: Denies fever, chills, weight loss/gain, headaches, insomnia,  night sweats, and change in appetite. Does c/o fatigue. Eyes: Denies redness, blurred vision, diplopia, discharge, itchy, watery eyes.  ENT: Denies discharge, congestion, post nasal drip, epistaxis, sore throat, earache, hearing loss, dental pain, Tinnitus, Vertigo, Sinus pain, snoring.  Cardio: Denies chest pain, palpitations, irregular heartbeat, syncope, dyspnea, diaphoresis, orthopnea, PND, claudication, edema Respiratory: denies cough, dyspnea, DOE, pleurisy, hoarseness, laryngitis, wheezing.  Gastrointestinal: Denies dysphagia, heartburn, reflux, water brash, pain, cramps, nausea, vomiting, bloating, diarrhea, constipation, hematemesis, melena, hematochezia, jaundice, hemorrhoids Genitourinary: Denies dysuria, frequency, urgency, nocturia, hesitancy, discharge, hematuria, flank pain Breast: Breast lumps, nipple discharge, bleeding.   Musculoskeletal: Denies arthralgia, myalgia, stiffness, Jt. Swelling, pain, limp, and strain/sprain. Denies falls. Skin: Denies puritis, rash, hives, warts, acne, eczema, changing in skin lesion Neuro: No weakness, tremor, incoordination, spasms, paresthesia, pain Psychiatric: Denies confusion, memory loss, sensory loss. Denies Depression. Endocrine: Denies change in weight, skin, hair change, nocturia, and paresthesia, diabetic polys, visual blurring, hyper / hypo glycemic episodes.  Heme/Lymph: No excessive bleeding, bruising, enlarged lymph nodes.  Physical Exam  BP 112/76   Pulse 73   Temp 97.9 F (36.6 C)   Resp 16   Ht 5' 8.5" (68.5 ")   Wt 159 lb 6.4 oz (72.3 kg)   SpO2 96%   BMI 23.88 kg/m  / /  Fingertip to fingertip span 68"  General Appearance: Well nourished, well groomed and in no apparent distress.  Eyes: PERRLA, EOMs, conjunctiva no swelling or erythema, normal fundi and vessels. Sinuses: No frontal/maxillary tenderness ENT/Mouth: EACs patent / TMs  nl. Nares clear without erythema, swelling, mucoid exudates. Oral hygiene is good. No erythema, swelling, or exudate. Tongue normal, non-obstructing. Tonsils not  swollen or erythematous. Hearing normal.  Neck: Supple, thyroid not palpable. No bruits, nodes or JVD. Respiratory: Respiratory effort normal.  BS equal and clear bilateral without rales, rhonci, wheezing or stridor. Cardio: Heart sounds are normal with regular rate and rhythm and no murmurs, rubs or gallops. Peripheral pulses are normal and equal bilaterally without edema. No aortic or femoral bruits. Chest: symmetric with normal excursions and percussion. Breasts: Deferrede to Gyn & MGM . Abdomen: Flat, soft with bowel sounds active. Nontender, no guarding, rebound, hernias, masses, or organomegaly.  Lymphatics: Non tender without lymphadenopathy.  Musculoskeletal: Full ROM all peripheral extremities, joint stability, 5/5 strength, and normal gait. Skin: Warm  and dry without rashes, lesions, cyanosis, clubbing or  ecchymosis.  Neuro: Cranial nerves intact, reflexes equal bilaterally. Normal muscle tone, no cerebellar symptoms. Sensation intact.  Pysch: Alert and oriented X 3, normal affect, Insight and Judgment appropriate.    Assessment and Plan  1. Annual Preventative Screening Examination   2. Labile hypertension  - EKG 12-Lead - [place], RETROPERITNL ABD,  LTD - Urinalysis, Routine w reflex microscopic - Microalbumin / creatinine urine ratio - CBC with Differential/Platelet - COMPLETE METABOLIC PANEL WITH GFR - Magnesium - TSH  3. Hyperlipidemia, mixed  - EKG 12-Lead - [place], RETROPERITNL ABD,  LTD - Lipid panel - TSH  4. Abnormal glucose  - EKG 12-Lead - [place], RETROPERITNL ABD,  LTD - Hemoglobin A1c - Insulin, random  5. Vitamin D deficiency  - VITAMIN D 25 Hydroxy  6. Screening examination for pulmonary tuberculosis  - TB Skin Test  7. Current mild episode of major depressive disorder  (HCC)  - TSH  8. Thoracic aortic ectasia (HCC)   9. Pectus excavatum by CT scan   10. Screening for heart disease  - EKG 12-Lead  11. FHx: heart disease  - EKG 12-Lead - [place], RETROPERITNL ABD,  LTD  12. Screening for AAA (aortic abdominal aneurysm)  - [place], RETROPERITNL ABD,  LTD  13. Screening for colorectal cancer  - POC Hemoccult Bld/Stl   14. Fatigue, unspecified type  - Iron, Total/Total Iron Binding Cap - Vitamin B12 - CBC with Differential/Platelet - TSH  15. Medication management  - CBC with Differential/Platelet - COMPLETE METABOLIC PANEL WITH GFR - Magnesium - Lipid panel - TSH - Hemoglobin A1c - Insulin, random - VITAMIN D 25 Hydroxy  16. Ectatic thoracic aorta (HCC)        Patient was counseled in prudent diet to achieve/maintain BMI less than 25 for weight control, BP monitoring, regular exercise and medications. Discussed med's effects and SE's. Screening labs and tests as requested with  regular follow-up as recommended. Over 40 minutes of exam, counseling, chart review and high complex critical decision making was performed.   Marinus Maw, MD

## 2023-08-10 NOTE — Patient Instructions (Signed)
Due to recent changes in healthcare laws, you may see the results of your imaging and laboratory studies on MyChart before your provider has had a chance to review them.  We understand that in some cases there may be results that are confusing or concerning to you. Not all laboratory results come back in the same time frame and the provider may be waiting for multiple results in order to interpret others.  Please give [place] 48 hours in order for your provider to thoroughly review all the results before contacting the office for clarification of your results.   ++++++++++++++++++++++++++++++  Vit D  & Vit C 1,000 mg   are recommended to help protect  against the Covid-19 and other Corona viruses.    Also it's recommended  to take  Zinc 50 mg  to help  protect against the Covid-19   and best place to get  is also on [email or web address]  and don't pay more than 6-8 cents /pill !  ================================ Coronavirus (COVID-19) Are you at risk?  Are you at risk for the Coronavirus (COVID-19)?  To be considered HIGH RISK for Coronavirus (COVID-19), you have to meet the following criteria:  Traveled to [place], [place], [place], [place] or [place]; or in the [place] to [place], [place], [place]  or [place]; and have fever, cough, and shortness of breath within the last 2 weeks of travel OR Been in close contact with a person diagnosed with COVID-19 within the last 2 weeks and have  fever, cough,and shortness of breath  IF YOU DO NOT MEET THESE CRITERIA, YOU ARE CONSIDERED LOW RISK FOR COVID-19.  What to do if you are HIGH RISK for COVID-19?  If you are having a medical emergency, call 911. Seek medical care right away. Before you go to a doctor's office, urgent care or emergency department,  call ahead and tell them about your recent travel, contact with someone diagnosed with COVID-19   and your symptoms.  You should receive instructions from your physician's office regarding  next steps of care.  When you arrive at healthcare provider, tell the healthcare staff immediately you have returned from  visiting [place], [place], [place], [place] or [place]; or traveled in the [place] to [place], [place],  [place] or [place] in the last two weeks or you have been in close contact with a person diagnosed with  COVID-19 in the last 2 weeks.   Tell the health care staff about your symptoms: fever, cough and shortness of breath. After you have been seen by a medical provider, you will be either: Tested for (COVID-19) and discharged home on quarantine except to seek medical care if  symptoms worsen, and asked to  Stay home and avoid contact with others until you get your results (4-5 days)  Avoid travel on public transportation if possible (such as bus, train, or airplane) or Sent to the Emergency Department by EMS for evaluation, COVID-19 testing  and  possible admission depending on your condition and test results.  What to do if you are LOW RISK for COVID-19?  Reduce your risk of any infection by using the same precautions used for avoiding the common cold or flu:  Wash your hands often with soap and warm water for at least 20 seconds.  If soap and water are not readily available,  use an alcohol-based hand sanitizer with at least 60% alcohol.  If coughing or sneezing, cover your mouth and nose by coughing  or sneezing into the elbow areas of your shirt or coat,  into a tissue or into your sleeve (not your hands). Avoid shaking hands with others and consider head nods or verbal greetings only. Avoid touching your eyes, nose, or mouth with unwashed hands.  Avoid close contact with people who are sick. Avoid places or events with large numbers of people in one location, like concerts or sporting events. Carefully consider travel plans you have or are making. If you are planning any travel outside or inside the [place], visit the CDC's Travelers' Health webpage for  the latest health notices. If you have some symptoms but not all symptoms, continue to monitor at home and seek medical attention  if your symptoms worsen. If you are having a medical emergency, call 911. >>>>>>>>>>>>>>>>>>>>>>> Preventive Care for Adults  A healthy lifestyle and preventive care can promote health and wellness. Preventive health guidelines for women include the following key practices. A routine yearly physical is a good way to check with your health care provider about your health and preventive screening. It is a chance to share any concerns and updates on your health and to receive a thorough exam. Visit your dentist for a routine exam and preventive care every 6 months. Brush your teeth twice a day and floss once a day. Good oral hygiene prevents tooth decay and gum disease. The frequency of eye exams is based on your age, health, family medical history, use of contact lenses, and other factors. Follow your health care provider's recommendations for frequency of eye exams. Eat a healthy diet. Foods like vegetables, fruits, whole grains, low-fat dairy products, and lean protein foods contain the nutrients you need without too many calories. Decrease your intake of foods high in solid fats, added sugars, and salt. Eat the right amount of calories for you. Get information about a proper diet from your health care provider, if necessary. Regular physical exercise is one of the most important things you can do for your health. Most adults should get at least 150 minutes of moderate-intensity exercise (any activity that increases your heart rate and causes you to sweat) each week. In addition, most adults need muscle-strengthening exercises on 2 or more days a week. Maintain a healthy weight. The body mass index (BMI) is a screening tool to identify possible weight problems. It provides an estimate of body fat based on height and weight. Your health care provider can find your BMI and can  help you achieve or maintain a healthy weight. For adults 20 years and older: A BMI below 18.5 is considered underweight. A BMI of 18.5 to 24.9 is normal. A BMI of 25 to 29.9 is considered overweight. A BMI of 30 and above is considered obese. Maintain normal blood lipids and cholesterol levels by exercising and minimizing your intake of saturated fat. Eat a balanced diet with plenty of fruit and vegetables. Blood tests for lipids and cholesterol should begin at age 43 and be repeated every 5 years. If your lipid or cholesterol levels are high, you are over 50, or you are at high risk for heart disease, you may need your cholesterol levels checked more frequently. Ongoing high lipid and cholesterol levels should be treated with medicines if diet and exercise are not working. If you smoke, find out from your health care provider how to quit. If you do not use tobacco, do not start. Lung cancer screening is recommended for adults aged 11-80 years who are at high risk for developing  lung cancer because of a history of smoking. A yearly low-dose CT scan of the lungs is recommended for people who have at least a 30-pack-year history of smoking and are a current smoker or have quit within the past 15 years. A pack year of smoking is smoking an average of 1 pack of cigarettes a day for 1 year (for example: 1 pack a day for 30 years or 2 packs a day for 15 years). Yearly screening should continue until the smoker has stopped smoking for at least 15 years. Yearly screening should be stopped for people who develop a health problem that would prevent them from having lung cancer treatment. High blood pressure causes heart disease and increases the risk of stroke. Your blood pressure should be checked at least every 1 to 2 years. Ongoing high blood pressure should be treated with medicines if weight loss and exercise do not work. If you are 32-3 years old, ask your health care provider if you should take aspirin to  prevent strokes. Diabetes screening involves taking a blood sample to check your fasting blood sugar level. This should be done once every 3 years, after age 32, if you are within normal weight and without risk factors for diabetes. Testing should be considered at a younger age or be carried out more frequently if you are overweight and have at least 1 risk factor for diabetes. Breast cancer screening is essential preventive care for women. You should practice "breast self-awareness." This means understanding the normal appearance and feel of your breasts and may include breast self-examination. Any changes detected, no matter how small, should be reported to a health care provider. Women in their 45s and 30s should have a clinical breast exam (CBE) by a health care provider as part of a regular health exam every 1 to 3 years. After age 83, women should have a CBE every year. Starting at age 92, women should consider having a mammogram (breast X-ray test) every year. Women who have a family history of breast cancer should talk to their health care provider about genetic screening. Women at a high risk of breast cancer should talk to their health care providers about having an MRI and a mammogram every year. Breast cancer gene (BRCA)-related cancer risk assessment is recommended for women who have family members with BRCA-related cancers. BRCA-related cancers include breast, ovarian, tubal, and peritoneal cancers. Having family members with these cancers may be associated with an increased risk for harmful changes (mutations) in the breast cancer genes BRCA1 and BRCA2. Results of the assessment will determine the need for genetic counseling and BRCA1 and BRCA2 testing. Routine pelvic exams to screen for cancer are no longer recommended for nonpregnant women who are considered low risk for cancer of the pelvic organs (ovaries, uterus, and vagina) and who do not have symptoms. Ask your health care provider if a  screening pelvic exam is right for you. If you have had past treatment for cervical cancer or a condition that could lead to cancer, you need Pap tests and screening for cancer for at least 20 years after your treatment. If Pap tests have been discontinued, your risk factors (such as having a new sexual partner) need to be reassessed to determine if screening should be resumed. Some women have medical problems that increase the chance of getting cervical cancer. In these cases, your health care provider may recommend more frequent screening and Pap tests. Colorectal cancer can be detected and often prevented. Most routine colorectal  cancer screening begins at the age of 10 years and continues through age 19 years. However, your health care provider may recommend screening at an earlier age if you have risk factors for colon cancer. On a yearly basis, your health care provider may provide home test kits to check for hidden blood in the stool. Use of a small camera at the end of a tube, to directly examine the colon (sigmoidoscopy or colonoscopy), can detect the earliest forms of colorectal cancer. Talk to your health care provider about this at age 28, when routine screening begins.  Direct exam of the colon should be repeated every 5-10 years through age 71 years, unless early forms of pre-cancerous polyps or small growths are found. Hepatitis C blood testing is recommended for all people born from [birth year] through 1965 and any individual with known risks for hepatitis C.  Osteoporosis is a disease in which the bones lose minerals and strength with aging. This can result in serious bone fractures or breaks. The risk of osteoporosis can be identified using a bone density scan. Women ages 21 years and over and women at risk for fractures or osteoporosis should discuss screening with their health care providers. Ask your health care provider whether you should take a calcium supplement or vitamin D to reduce the rate  of osteoporosis. Menopause can be associated with physical symptoms and risks. Hormone replacement therapy is available to decrease symptoms and risks. You should talk to your health care provider about whether hormone replacement therapy is right for you. Use sunscreen. Apply sunscreen liberally and repeatedly throughout the day. You should seek shade when your shadow is [place] than you. Protect yourself by wearing long sleeves, pants, a wide-brimmed hat, and sunglasses year round, whenever you are outdoors. Once a month, do a whole body skin exam, using a mirror to look at the skin on your back. Tell your health care provider of new moles, moles that have irregular borders, moles that are larger than a pencil eraser, or moles that have changed in shape or color. Stay current with required vaccines (immunizations). Influenza vaccine. All adults should be immunized every year. Tetanus, diphtheria, and acellular pertussis (Td, Tdap) vaccine. Pregnant women should receive 1 dose of Tdap vaccine during each pregnancy. The dose should be obtained regardless of the length of time since the last dose. Immunization is preferred during the 27th-36th week of gestation. An adult who has not previously received Tdap or who does not know her vaccine status should receive 1 dose of Tdap. This initial dose should be followed by tetanus and diphtheria toxoids (Td) booster doses every 10 years. Adults with an unknown or incomplete history of completing a 3-dose immunization series with Td-containing vaccines should begin or complete a primary immunization series including a Tdap dose. Adults should receive a Td booster every 10 years. Varicella vaccine. An adult without evidence of immunity to varicella should receive 2 doses or a second dose if she has previously received 1 dose. Pregnant females who do not have evidence of immunity should receive the first dose after pregnancy. This first dose should be obtained before  leaving the health care facility. The second dose should be obtained 4-8 weeks after the first dose. Human papillomavirus (HPV) vaccine. Females aged 13-26 years who have not received the vaccine previously should obtain the 3-dose series. The vaccine is not recommended for use in pregnant females. However, pregnancy testing is not needed before receiving a dose. If a female is found to  be pregnant after receiving a dose, no treatment is needed. In that case, the remaining doses should be delayed until after the pregnancy. Immunization is recommended for any person with an immunocompromised condition through the age of 55 years if she did not get any or all doses earlier. During the 3-dose series, the second dose should be obtained 4-8 weeks after the first dose. The third dose should be obtained 24 weeks after the first dose and 16 weeks after the second dose. Zoster vaccine. One dose is recommended for adults aged 15 years or older unless certain conditions are present. Measles, mumps, and rubella (MMR) vaccine. Adults born before 20 generally are considered immune to measles and mumps. Adults born in [birth year] or later should have 1 or more doses of MMR vaccine unless there is a contraindication to the vaccine or there is laboratory evidence of immunity to each of the three diseases. A routine second dose of MMR vaccine should be obtained at least 28 days after the first dose for students attending postsecondary schools, health care workers, or international travelers. People who received inactivated measles vaccine or an unknown type of measles vaccine during 1963-1967 should receive 2 doses of MMR vaccine. People who received inactivated mumps vaccine or an unknown type of mumps vaccine before 1979 and are at high risk for mumps infection should consider immunization with 2 doses of MMR vaccine. For females of childbearing age, rubella immunity should be determined. If there is no evidence of immunity, females  who are not pregnant should be vaccinated. If there is no evidence of immunity, females who are pregnant should delay immunization until after pregnancy. Unvaccinated health care workers born before [birth year] who lack laboratory evidence of measles, mumps, or rubella immunity or laboratory confirmation of disease should consider measles and mumps immunization with 2 doses of MMR vaccine or rubella immunization with 1 dose of MMR vaccine. Pneumococcal 13-valent conjugate (PCV13) vaccine. When indicated, a person who is uncertain of her immunization history and has no record of immunization should receive the PCV13 vaccine. An adult aged 4 years or older who has certain medical conditions and has not been previously immunized should receive 1 dose of PCV13 vaccine. This PCV13 should be followed with a dose of pneumococcal polysaccharide (PPSV23) vaccine. The PPSV23 vaccine dose should be obtained at least 1 or more year(s) after the dose of PCV13 vaccine. An adult aged 15 years or older who has certain medical conditions and previously received 1 or more doses of PPSV23 vaccine should receive 1 dose of PCV13. The PCV13 vaccine dose should be obtained 1 or more years after the last PPSV23 vaccine dose.  Pneumococcal polysaccharide (PPSV23) vaccine. When PCV13 is also indicated, PCV13 should be obtained first. All adults aged 9 years and older should be immunized. An adult younger than age 14 years who has certain medical conditions should be immunized. Any person who resides in a nursing home or long-term care facility should be immunized. An adult smoker should be immunized. People with an immunocompromised condition and certain other conditions should receive both PCV13 and PPSV23 vaccines. People with human immunodeficiency virus (HIV) infection should be immunized as soon as possible after diagnosis. Immunization during chemotherapy or radiation therapy should be avoided. Routine use of PPSV23 vaccine is not  recommended for American Indians, [place] Natives, or people younger than 65 years unless there are medical conditions that require PPSV23 vaccine. When indicated, people who have unknown immunization and have no record of immunization should receive  PPSV23 vaccine. One-time revaccination 5 years after the first dose of PPSV23 is recommended for people aged 19-64 years who have chronic kidney failure, nephrotic syndrome, asplenia, or immunocompromised conditions. People who received 1-2 doses of PPSV23 before age 36 years should receive another dose of PPSV23 vaccine at age 52 years or later if at least 5 years have passed since the previous dose. Doses of PPSV23 are not needed for people immunized with PPSV23 at or after age 34 years.  Preventive Services / Frequency  Ages 74 to 1 years Blood pressure check. Lipid and cholesterol check. Lung cancer screening. / Every year if you are aged 53-80 years and have a 30-pack-year history of smoking and currently smoke or have quit within the past 15 years. Yearly screening is stopped once you have quit smoking for at least 15 years or develop a health problem that would prevent you from having lung cancer treatment. Clinical breast exam.** / Every year after age 16 years.  BRCA-related cancer risk assessment.** / For women who have family members with a BRCA-related cancer (breast, ovarian, tubal, or peritoneal cancers). Mammogram.** / Every year beginning at age 26 years and continuing for as long as you are in good health. Consult with your health care provider. Pap test.** / Every 3 years starting at age 19 years through age 52 or 29 years with a history of 3 consecutive normal Pap tests. HPV screening.** / Every 3 years from ages 18 years through ages 4 to 110 years with a history of 3 consecutive normal Pap tests. Fecal occult blood test (FOBT) of stool. / Every year beginning at age 70 years and continuing until age 53 years. You may not need to do this  test if you get a colonoscopy every 10 years. Flexible sigmoidoscopy or colonoscopy.** / Every 5 years for a flexible sigmoidoscopy or every 10 years for a colonoscopy beginning at age 66 years and continuing until age 44 years. Hepatitis C blood test.** / For all people born from [birth year] through 1965 and any individual with known risks for hepatitis C. Skin self-exam. / Monthly. Influenza vaccine. / Every year. Tetanus, diphtheria, and acellular pertussis (Tdap/Td) vaccine.** / Consult your health care provider. Pregnant women should receive 1 dose of Tdap vaccine during each pregnancy. 1 dose of Td every 10 years. Varicella vaccine.** / Consult your health care provider. Pregnant females who do not have evidence of immunity should receive the first dose after pregnancy. Zoster vaccine.** / 1 dose for adults aged 90 years or older. Pneumococcal 13-valent conjugate (PCV13) vaccine.** / Consult your health care provider. Pneumococcal polysaccharide (PPSV23) vaccine.** / 1 to 2 doses if you smoke cigarettes or if you have certain conditions. Meningococcal vaccine.** / Consult your health care provider. Hepatitis A vaccine.** / Consult your health care provider. Hepatitis B vaccine.** / Consult your health care provider. Screening for abdominal aortic aneurysm (AAA)  by ultrasound is recommended for people over 50 who have history of high blood pressure or who are current or former smokers. ++++++++++++++++++ Recommend Adult Low Dose Aspirin or  coated  Aspirin 81 mg daily  To reduce risk of Colon Cancer 40 %,  Skin Cancer 26 % ,  Melanoma 46%  and  Pancreatic cancer 60% +++++++++++++++++++ Vitamin D goal  is between 70-100.  Please make sure that you are taking your Vitamin D as directed.  It is very important as a natural anti-inflammatory  helping hair, skin, and nails, as well as reducing stroke and heart  attack risk.  It helps your bones and helps with mood. It also decreases numerous  cancer risks so please take it as directed.  Low Vit D is associated with a 200-300% higher risk for CANCER  and 200-300% higher risk for HEART   ATTACK  &  STROKE.   .....................................Marland Kitchen It is also associated with higher death rate at younger ages,  autoimmune diseases like Rheumatoid arthritis, Lupus, Multiple Sclerosis.    Also many other serious conditions, like depression, Alzheimer's Dementia, infertility, muscle aches, fatigue, fibromyalgia - just to name a few. ++++++++++++++++++ Recommend the book "The END of DIETING" by Dr Excell Seltzer  & the book "The END of DIABETES " by Dr Excell Seltzer At [email or web address] - get book & Audio CD's    Being diabetic has a  300% increased risk for heart attack, stroke, cancer, and alzheimer- type vascular dementia. It is very important that you work harder with diet by avoiding all foods that are white. Avoid white rice (brown & wild rice is OK), white potatoes (sweetpotatoes in moderation is OK), White bread or wheat bread or anything made out of white flour like bagels, donuts, rolls, buns, biscuits, cakes, pastries, cookies, pizza crust, and pasta (made from white flour & egg whites) - vegetarian pasta or spinach or wheat pasta is OK. Multigrain breads like Arnold's or [employer], or multigrain sandwich thins or flatbreads.  Diet, exercise and weight loss can reverse and cure diabetes in the early stages.  Diet, exercise and weight loss is very important in the control and prevention of complications of diabetes which affects every system in your body, ie. Brain - dementia/stroke, eyes - glaucoma/blindness, heart - heart attack/heart failure, kidneys - dialysis, stomach - gastric paralysis, intestines - malabsorption, nerves - severe painful neuritis, circulation - gangrene & loss of a leg(s), and finally cancer and Alzheimers.    I recommend avoid fried & greasy foods,  sweets/candy, white rice (brown or wild rice or Quinoa is OK), white  potatoes (sweet potatoes are OK) - anything made from white flour - bagels, doughnuts, rolls, buns, biscuits,white and wheat breads, pizza crust and traditional pasta made of white flour & egg white(vegetarian pasta or spinach or wheat pasta is OK).  Multi-grain bread is OK - like multi-grain flat bread or sandwich thins. Avoid alcohol in excess. Exercise is also important.    Eat all the vegetables you want - avoid meat, especially red meat and dairy - especially cheese.  Cheese is the most concentrated form of trans-fats which is the worst thing to clog up our arteries. Veggie cheese is OK which can be found in the fresh produce section at [employer] or [employer] or [employer]  ++++++++++++++++++++++ DASH Eating Plan  DASH stands for "Dietary Approaches to Stop Hypertension."   The DASH eating plan is a healthy eating plan that has been shown to reduce high blood pressure (hypertension). Additional health benefits may include reducing the risk of type 2 diabetes mellitus, heart disease, and stroke. The DASH eating plan may also help with weight loss. WHAT DO I NEED TO KNOW ABOUT THE DASH EATING PLAN? For the DASH eating plan, you will follow these general guidelines: Choose foods with a percent daily value for sodium of less than 5% (as listed on the food label). Use salt-free seasonings or herbs instead of table salt or sea salt. Check with your health care provider or pharmacist before using salt substitutes. Eat lower-sodium products, often labeled as "lower sodium" or "no  salt added." Eat fresh foods. Eat more vegetables, fruits, and low-fat dairy products. Choose whole grains. Look for the word "whole" as the first word in the ingredient list. Choose fish  Limit sweets, desserts, sugars, and sugary drinks. Choose heart-healthy fats. Eat veggie cheese  Eat more home-cooked food and less restaurant, buffet, and fast food. Limit fried foods. Cook foods using methods other than  frying. Limit canned vegetables. If you do use them, rinse them well to decrease the sodium. When eating at a restaurant, ask that your food be prepared with less salt, or no salt if possible.                      WHAT FOODS CAN I EAT? Read Dr Fara Olden Fuhrman's books on The End of Dieting & The End of Diabetes  Grains Whole grain or whole wheat bread. Brown rice. Whole grain or whole wheat pasta. Quinoa, bulgur, and whole grain cereals. Low-sodium cereals. Corn or whole wheat flour tortillas. Whole grain cornbread. Whole grain crackers. Low-sodium crackers.  Vegetables Fresh or frozen vegetables (raw, steamed, roasted, or grilled). Low-sodium or reduced-sodium tomato and vegetable juices. Low-sodium or reduced-sodium tomato sauce and paste. Low-sodium or reduced-sodium canned vegetables.   Fruits All fresh, canned (in natural juice), or frozen fruits.  Protein Products  All fish and seafood.  Dried beans, peas, or lentils. Unsalted nuts and seeds. Unsalted canned beans.  Dairy Low-fat dairy products, such as skim or 1% milk, 2% or reduced-fat cheeses, low-fat ricotta or cottage cheese, or plain low-fat yogurt. Low-sodium or reduced-sodium cheeses.  Fats and Oils Tub margarines without trans fats. Light or reduced-fat mayonnaise and salad dressings (reduced sodium). Avocado. Safflower, olive, or canola oils. Natural peanut or almond butter.  Other Unsalted popcorn and pretzels. The items listed above may not be a complete list of recommended foods or beverages. Contact your dietitian for more options.  ++++++++++++++++++  WHAT FOODS ARE NOT RECOMMENDED? Grains/ White flour or wheat flour White bread. White pasta. White rice. Refined cornbread. Bagels and croissants. Crackers that contain trans fat.  Vegetables  Creamed or fried vegetables. Vegetables in a . Regular canned vegetables. Regular canned tomato sauce and paste. Regular tomato and vegetable juices.  Fruits Dried fruits.  Canned fruit in light or heavy syrup. Fruit juice.  Meat and Other Protein Products Meat in general - RED meat & White meat.  Fatty cuts of meat. Ribs, chicken wings, all processed meats as bacon, sausage, bologna, salami, fatback, hot dogs, bratwurst and packaged luncheon meats.  Dairy Whole or 2% milk, cream, half-and-half, and cream cheese. Whole-fat or sweetened yogurt. Full-fat cheeses or blue cheese. Non-dairy creamers and whipped toppings. Processed cheese, cheese spreads, or cheese curds.  Condiments Onion and garlic salt, seasoned salt, table salt, and sea salt. Canned and packaged gravies. Worcestershire sauce. Tartar sauce. Barbecue sauce. Teriyaki sauce. Soy sauce, including reduced sodium. Steak sauce. Fish sauce. Oyster sauce. Cocktail sauce. Horseradish. Ketchup and mustard. Meat flavorings and tenderizers. Bouillon cubes. Hot sauce. Tabasco sauce. Marinades. Taco seasonings. Relishes.  Fats and Oils Butter, stick margarine, lard, shortening and bacon fat. Coconut, palm kernel, or palm oils. Regular salad dressings.  Pickles and olives. Salted popcorn and pretzels.  The items listed above may not be a complete list of foods and beverages to avoid.

## 2023-08-11 ENCOUNTER — Ambulatory Visit (INDEPENDENT_AMBULATORY_CARE_PROVIDER_SITE_OTHER): Payer: 59 | Admitting: Internal Medicine

## 2023-08-11 ENCOUNTER — Encounter: Payer: Self-pay | Admitting: Internal Medicine

## 2023-08-11 VITALS — BP 118/78 | HR 67 | Temp 97.8°F | Resp 16 | Ht 69.0 in | Wt 162.6 lb

## 2023-08-11 DIAGNOSIS — Z0000 Encounter for general adult medical examination without abnormal findings: Secondary | ICD-10-CM

## 2023-08-11 DIAGNOSIS — Z136 Encounter for screening for cardiovascular disorders: Secondary | ICD-10-CM | POA: Diagnosis not present

## 2023-08-11 DIAGNOSIS — Z0001 Encounter for general adult medical examination with abnormal findings: Secondary | ICD-10-CM

## 2023-08-11 DIAGNOSIS — R0989 Other specified symptoms and signs involving the circulatory and respiratory systems: Secondary | ICD-10-CM

## 2023-08-11 DIAGNOSIS — I77810 Thoracic aortic ectasia: Secondary | ICD-10-CM

## 2023-08-11 DIAGNOSIS — Z111 Encounter for screening for respiratory tuberculosis: Secondary | ICD-10-CM | POA: Diagnosis not present

## 2023-08-11 DIAGNOSIS — Z8249 Family history of ischemic heart disease and other diseases of the circulatory system: Secondary | ICD-10-CM

## 2023-08-11 DIAGNOSIS — R7309 Other abnormal glucose: Secondary | ICD-10-CM

## 2023-08-11 DIAGNOSIS — E782 Mixed hyperlipidemia: Secondary | ICD-10-CM

## 2023-08-11 DIAGNOSIS — Z1211 Encounter for screening for malignant neoplasm of colon: Secondary | ICD-10-CM

## 2023-08-11 DIAGNOSIS — E559 Vitamin D deficiency, unspecified: Secondary | ICD-10-CM

## 2023-08-11 DIAGNOSIS — R5383 Other fatigue: Secondary | ICD-10-CM

## 2023-08-11 DIAGNOSIS — I700 Atherosclerosis of aorta: Secondary | ICD-10-CM | POA: Diagnosis not present

## 2023-08-11 DIAGNOSIS — Z79899 Other long term (current) drug therapy: Secondary | ICD-10-CM

## 2023-08-12 LAB — CBC WITH DIFFERENTIAL/PLATELET
Absolute Monocytes: 643 {cells}/uL (ref 200–950)
Basophils Absolute: 80 {cells}/uL (ref 0–200)
Basophils Relative: 1.2 %
Eosinophils Absolute: 188 {cells}/uL (ref 15–500)
Eosinophils Relative: 2.8 %
HCT: 36.9 % (ref 35.0–45.0)
Hemoglobin: 12.2 g/dL (ref 11.7–15.5)
Lymphs Abs: 2111 {cells}/uL (ref 850–3900)
MCH: 33.3 pg — ABNORMAL HIGH (ref 27.0–33.0)
MCHC: 33.1 g/dL (ref 32.0–36.0)
MCV: 100.8 fL — ABNORMAL HIGH (ref 80.0–100.0)
MPV: 10.9 fL (ref 7.5–12.5)
Monocytes Relative: 9.6 %
Neutro Abs: 3678 {cells}/uL (ref 1500–7800)
Neutrophils Relative %: 54.9 %
Platelets: 242 10*3/uL (ref 140–400)
RBC: 3.66 10*6/uL — ABNORMAL LOW (ref 3.80–5.10)
RDW: 12.3 % (ref 11.0–15.0)
Total Lymphocyte: 31.5 %
WBC: 6.7 10*3/uL (ref 3.8–10.8)

## 2023-08-12 LAB — HEMOGLOBIN A1C
Hgb A1c MFr Bld: 5.4 %{Hb} (ref <5.7)
Mean Plasma Glucose: 108 mg/dL
eAG (mmol/L): 6 mmol/L

## 2023-08-12 LAB — LIPID PANEL
Cholesterol: 174 mg/dL (ref <200)
HDL: 72 mg/dL (ref >=50)
LDL Cholesterol (Calc): 82 mg/dL
Non-HDL Cholesterol (Calc): 102 mg/dL (ref <130)
Total CHOL/HDL Ratio: 2.4 (calc) (ref <5.0)
Triglycerides: 103 mg/dL (ref <150)

## 2023-08-12 LAB — COMPLETE METABOLIC PANEL WITH GFR
AG Ratio: 1.8 (calc) (ref 1.0–2.5)
ALT: 15 U/L (ref 6–29)
AST: 16 U/L (ref 10–35)
Albumin: 4.3 g/dL (ref 3.6–5.1)
Alkaline phosphatase (APISO): 84 U/L (ref 37–153)
BUN: 24 mg/dL (ref 7–25)
CO2: 26 mmol/L (ref 20–32)
Calcium: 9.7 mg/dL (ref 8.6–10.4)
Chloride: 105 mmol/L (ref 98–110)
Creat: 0.84 mg/dL (ref 0.50–1.03)
Globulin: 2.4 g/dL (ref 1.9–3.7)
Glucose, Bld: 91 mg/dL (ref 65–99)
Potassium: 4.6 mmol/L (ref 3.5–5.3)
Sodium: 137 mmol/L (ref 135–146)
Total Bilirubin: 0.2 mg/dL (ref 0.2–1.2)
Total Protein: 6.7 g/dL (ref 6.1–8.1)
eGFR: 82 mL/min/{1.73_m2} (ref >=60)

## 2023-08-12 LAB — URINALYSIS, ROUTINE W REFLEX MICROSCOPIC
Bilirubin Urine: NEGATIVE
Glucose, UA: NEGATIVE
Hgb urine dipstick: NEGATIVE
Ketones, ur: NEGATIVE
Leukocytes,Ua: NEGATIVE
Nitrite: NEGATIVE
Protein, ur: NEGATIVE
Specific Gravity, Urine: 1.018 (ref 1.001–1.035)
pH: 5.5 (ref 5.0–8.0)

## 2023-08-12 LAB — IRON,TIBC AND FERRITIN PANEL
%SAT: 22 % (ref 16–45)
Ferritin: 113 ng/mL (ref 16–232)
Iron: 62 ug/dL (ref 45–160)
TIBC: 278 ug/dL (ref 250–450)

## 2023-08-12 LAB — MICROALBUMIN / CREATININE URINE RATIO
Creatinine, Urine: 57 mg/dL (ref 20–275)
Microalb Creat Ratio: 7 mg/g{creat} (ref <30)
Microalb, Ur: 0.4 mg/dL

## 2023-08-12 LAB — VITAMIN B12: Vitamin B-12: 602 pg/mL (ref 200–1100)

## 2023-08-12 LAB — MAGNESIUM: Magnesium: 2.1 mg/dL (ref 1.5–2.5)

## 2023-08-12 LAB — VITAMIN D 25 HYDROXY (VIT D DEFICIENCY, FRACTURES): Vit D, 25-Hydroxy: 86 ng/mL (ref 30–100)

## 2023-08-12 LAB — TSH: TSH: 1.17 m[IU]/L (ref 0.40–4.50)

## 2023-08-12 LAB — INSULIN, RANDOM: Insulin: 5 u[IU]/mL

## 2023-08-12 NOTE — Progress Notes (Signed)
<>*<>*<>*<>*<>*<>*<>*<>*<>*<>*<>*<>*<>*<>*<>*<>*<>*<>*<>*<>*<>*<>*<>*<>*<> <>*<>*<>*<>*<>*<>*<>*<>*<>*<>*<>*<>*<>*<>*<>*<>*<>*<>*<>*<>*<>*<>*<>*<>*<>  -  Test results slightly outside the reference range are not unusual. If there is anything important, I will review this with you,  otherwise it is considered normal test values.  If you have further questions,  please do not hesitate to contact me at the office or via My Chart.   <>*<>*<>*<>*<>*<>*<>*<>*<>*<>*<>*<>*<>*<>*<>*<>*<>*<>*<>*<>*<>*<>*<>*<>*<> <>*<>*<>*<>*<>*<>*<>*<>*<>*<>*<>*<>*<>*<>*<>*<>*<>*<>*<>*<>*<>*<>*<>*<>*<>  -  Vitamin B12 - Normal - Great !   <>*<>*<>*<>*<>*<>*<>*<>*<>*<>*<>*<>*<>*<>*<>*<>*<>*<>*<>*<>*<>*<>*<>*<>*<> <>*<>*<>*<>*<>*<>*<>*<>*<>*<>*<>*<>*<>*<>*<>*<>*<>*<>*<>*<>*<>*<>*<>*<>*<>  -  Iron Levels Normal   <>*<>*<>*<>*<>*<>*<>*<>*<>*<>*<>*<>*<>*<>*<>*<>*<>*<>*<>*<>*<>*<>*<>*<>*<> <>*<>*<>*<>*<>*<>*<>*<>*<>*<>*<>*<>*<>*<>*<>*<>*<>*<>*<>*<>*<>*<>*<>*<>*<>  -  Chol  much better - down from 214 to now 174   &  - Bad LDL Chol is down from 106  to now 82 - >  Both  Excellent   - Very low risk for Heart Attack  / Stroke  <>*<>*<>*<>*<>*<>*<>*<>*<>*<>*<>*<>*<>*<>*<>*<>*<>*<>*<>*<>*<>*<>*<>*<>*<> <>*<>*<>*<>*<>*<>*<>*<>*<>*<>*<>*<>*<>*<>*<>*<>*<>*<>*<>*<>*<>*<>*<>*<>*<>  -  A1c - Normal - No Diabetes  - Great   !  <>*<>*<>*<>*<>*<>*<>*<>*<>*<>*<>*<>*<>*<>*<>*<>*<>*<>*<>*<>*<>*<>*<>*<>*<> <>*<>*<>*<>*<>*<>*<>*<>*<>*<>*<>*<>*<>*<>*<>*<>*<>*<>*<>*<>*<>*<>*<>*<>*<>  -  Vitamin = 86 - > Wonderful - Please keep dosage  Same   - Vitamin D goal is between 70-100.    - It is very important as a natural anti-inflammatory and helping the                    immune system protect against viral infections, like the Covid-19, Flu, etc    - helping hair, skin, and nails, as well as reducing stroke and heart attack risk.   - It helps your bones and helps with mood.  - It also decreases numerous cancer risks so  please   - Low Vit D is associated with a 200-300% higher risk for CANCER   and 200-300% higher risk for HEART   ATTACK  &  STROKE.    - It is also associated with higher death rate at younger ages,   autoimmune diseases like Rheumatoid arthritis, Lupus, Multiple Sclerosis.     - Also,    many other serious conditions, like Depression, Alzheimer's Dementia,    muscle aches, fatigue, fibromyalgia   <>*<>*<>*<>*<>*<>*<>*<>*<>*<>*<>*<>*<>*<>*<>*<>*<>*<>*<>*<>*<>*<>*<>*<>*<> <>*<>*<>*<>*<>*<>*<>*<>*<>*<>*<>*<>*<>*<>*<>*<>*<>*<>*<>*<>*<>*<>*<>*<>*<>  -  All Else - CBC - Kidneys - Electrolytes - Liver - Magnesium & Thyroid    - all  Normal / OK  <>*<>*<>*<>*<>*<>*<>*<>*<>*<>*<>*<>*<>*<>*<>*<>*<>*<>*<>*<>*<>*<>*<>*<>*<> <>*<>*<>*<>*<>*<>*<>*<>*<>*<>*<>*<>*<>*<>*<>*<>*<>*<>*<>*<>*<>*<>*<>*<>*<>

## 2023-08-14 ENCOUNTER — Ambulatory Visit: Payer: 59 | Admitting: Adult Health

## 2023-08-14 ENCOUNTER — Encounter: Payer: Self-pay | Admitting: Adult Health

## 2023-08-14 VITALS — BP 123/84 | HR 79 | Ht 69.0 in | Wt 162.0 lb

## 2023-08-14 DIAGNOSIS — J4530 Mild persistent asthma, uncomplicated: Secondary | ICD-10-CM | POA: Diagnosis not present

## 2023-08-14 DIAGNOSIS — Z01811 Encounter for preprocedural respiratory examination: Secondary | ICD-10-CM | POA: Diagnosis not present

## 2023-08-14 DIAGNOSIS — J309 Allergic rhinitis, unspecified: Secondary | ICD-10-CM | POA: Diagnosis not present

## 2023-08-14 NOTE — Assessment & Plan Note (Addendum)
Pulmonary preop risk assessment.  Patient is fully independent.  Asthma appears to be under good control with no recent flare.  Previous PFTs have showed normal lung function with no significant airflow obstruction or restriction.  She is compliant with her medication and is a never smoker.  From a pulmonary standpoint would be a lower risk as long as her asthma is under good control.  We discussed potential pulmonary risk with patient Recommend that she take her maintenance inhaler prior to surgery.  Albuterol as needed   Major Pulmonary risks identified in the multifactorial risk analysis are but not limited to a) pneumonia; b) recurrent intubation risk; c) prolonged or recurrent acute respiratory failure needing mechanical ventilation; d) prolonged hospitalization; e) DVT/Pulmonary embolism; f) Acute Pulmonary edema  Recommend 1. Short duration of surgery as much as possible and avoid paralytic if possible  DVT prophylaxis per protocol .  Marland Kitchen Aggressive pulmonary toilet with o2, bronchodilatation, and incentive spirometry and early ambulation

## 2023-08-14 NOTE — Assessment & Plan Note (Signed)
Mild persistent asthma currently under good control.  Patient has an allergic phenotype.  Continue on current regimen.  Previous PFTs showed normal lung function.  Plan  . Patient Instructions  Continue on TRELEGY 1 puff daily, rinse after use.  Albuterol inhaler As needed   Asthma action plan as discussed.   Zyrtec 10mg  daily  Saline nasal rinses As needed    Follow up with Dr. Tonia Brooms or Hend Mccarrell  6  months and As needed   Please contact office for sooner follow up if symptoms do not improve or worsen or seek emergency care

## 2023-08-14 NOTE — Progress Notes (Signed)
@Patient  ID: Nicole Waller, female    DOB: 07-04-1966, 57 y.o.   MRN: [id number]  Chief Complaint  Patient presents with   Follow-up    Surgical clearance pt has rt knee sx on 08/20/23    Referring provider: Lucky Cowboy, MD  HPI: 57 year old female never smoker seen for consult February 07, 2022 for asthma. 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  FEV1 100%.,  Ratio 88, FVC 89%.,  DLCO 93%. (Took Trelegy prior to test)    02/22/22 High-resolution CT chest that showed no evidence of interstitial lung disease.  No acute process.  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 home sleep study that was completed on April 22, 2022 that showed no significant sleep apnea with AHI at 3.9/hour and SPO2 low at 87%   08/14/2023 Follow up ; Asthma and Pre Op eval Patient returns for a 1 year follow-up.  Patient has mild persistent asthma.  She remains on Trelegy inhaler daily.  Says overall her breathing has doing well.  She denies any flare of cough or wheezing.  She remains very active with her horse farm.  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  She denies any increased albuterol use.  She does have a history of rheumatoid arthritis.  Was previously on methotrexate.  Previous high-resolution CT chest April 2023 showed no evidence of interstitial lung disease.  She did have some previous snoring.  And sleep issues.  She was set up for home sleep study in June 2023 that was negative for sleep apnea. Patient says she is going for upcoming knee surgery next week.  She is here for a pulmonary preop clearance.  As above  patient is fully independent.  Has had previous surgery in the past with no known difficulties with anesthesia other than nausea.  Allergies  Allergen Reactions   Accolate [Zafirlukast]     GI Upset   Codeine     REACTION: nausea and vomiting   Lyrica [Pregabalin]     Fatigue   Oxycodone     nauseated   Probiotic [Acidophilus]     Headache   Tape     Causes redness, and skin gets raw!    Immunization History  Administered Date(s) Administered   Influenza Split 08/11/2014, 08/19/2017   Influenza Whole 08/11/2012   Influenza,inj,Quad PF,6+ Mos 08/19/2017, 08/11/2021   PFIZER(Purple Top)SARS-COV-2 Vaccination 01/18/2020, 02/08/2020   PPD Test 04/16/2016, 05/12/2017, 05/26/2018, 06/23/2019, 06/26/2020, 07/05/2021, 08/06/2022, 08/11/2023   Pneumococcal Conjugate-13 10/09/2017   Pneumococcal Polysaccharide-23 12/04/2017   Pneumococcal-Unspecified 11/11/2006   Td 11/11/2006   Tdap 05/12/2017    Past Medical History:  Diagnosis Date   Abdominal pain    in past   Abnormal chest CT    in past   Abnormal EKG    Anxiety    since head injury   Chronic cough    Chronic headache    Concussion    past injury from horse   Dyspnea    Exercise-induced asthma    Head injury 04/2013   due to horse injury/ hit backwards by horse head!   History of nephrolithiasis    Hx  of migraines    Post-operative nausea and vomiting    Pulmonary nodule 2011   Rosacea     Tobacco History: Social History   Tobacco Use  Smoking Status Never   Passive exposure: Past  Smokeless Tobacco Never   Counseling given: Not Answered   Outpatient Medications Prior to Visit  Medication Sig Dispense Refill   Adalimumab (HUMIRA PEN) 40 MG/0.4ML PNKT 0.4 ml     albuterol (VENTOLIN HFA) 108 (90 Base) MCG/ACT inhaler INHALE 2 PUFFS BY MOUTH EVERY 15 MINUTES APART EVERY 4 HOURS TO RESCUE ASTHMA 8.5 each 11   Amphetamine ER 9.4 MG TBED Take 1 tablet by mouth daily.     aspirin 81 MG tablet Take 81 mg by  mouth. Take one pill every 4 days     B Complex Vitamins (B COMPLEX-B12) TABS Take 1 tablet by mouth daily.     Calcium 600-200 MG-UNIT per tablet Take 1 tablet by mouth every other day. Every three days     cetirizine (ZYRTEC) 10 MG tablet Take 10 mg by mouth daily.     Cholecalciferol (VITAMIN D) 125 MCG (5000 UT) CAPS Take 1 capsule by mouth daily.     CHOLINE PO Take 1 tablet by mouth daily.     Coenzyme Q10 (COQ10) 100 MG CAPS Take 200 mg by mouth daily.     DULoxetine (CYMBALTA) 30 MG capsule Take 1 capsule (30 mg total) by mouth daily. Patient needs appt for additional refills. Call office to schedule. (Patient taking differently: Take 60 mg by mouth every other day. Patient needs appt for additional refills. Call office to schedule.) 30 capsule 0   estradiol (VIVELLE-DOT) 0.05 MG/24HR patch Place 1 patch onto the skin 2 (two) times a week.     Fluticasone-Umeclidin-Vilant (TRELEGY ELLIPTA) 100-62.5-25 MCG/ACT AEPB Inhale 1 puff into the lungs daily. 60 each 11   IMITREX 100 MG tablet TAKE 1 TABLET IMMEDIATELY AT ONSET OF MIGRAINE & MAY REPEAT ONCE IN 2 HOURS (MAX 2 TABS /24 HOURS). 9 tablet 3   leflunomide (ARAVA) 20 MG tablet Take 20 mg by mouth daily.     MAGNESIUM GLYCINATE PLUS PO Take 400 mg by mouth 2 (two) times daily.     montelukast (SINGULAIR) 10 MG tablet TAKE 1 TABLET BY MOUTH  DAILY FOR ALLERGIES 90 tablet 3   Multiple Vitamin (MULTIVITAMIN) capsule Take 1 capsule by mouth daily.       NON FORMULARY [employer] lotion prn for rosecea     NP THYROID 15 MG tablet Take 15 mg by mouth daily.     NP THYROID 30 MG tablet Take 30 mg by mouth daily.     Omega-3 Fatty Acids (FISH OIL) 1000 MG CAPS Take 1,000 mg by mouth daily.     OVER THE COUNTER MEDICATION Take 1 tablet by mouth daily.     progesterone (PROMETRIUM) 100 MG capsule Take 100 mg by mouth at bedtime.     valACYclovir (VALTREX) 500 MG tablet Take 500 mg by mouth daily as needed (for outbreak).      zinc gluconate 50 MG tablet  Take 50 mg by mouth daily.     No facility-administered medications prior to visit.     Review of Systems:   Constitutional:   No  weight loss, night sweats,  Fevers, chills, fatigue, or  lassitude.  HEENT:   No headaches,  Difficulty swallowing,  Tooth/dental problems, or  Sore throat,  No sneezing, itching, ear ache, nasal congestion, post nasal drip,   CV:  No chest pain,  Orthopnea, PND, swelling in lower extremities, anasarca, dizziness, palpitations, syncope.   GI  No heartburn, indigestion, abdominal pain, nausea, vomiting, diarrhea, change in bowel habits, loss of appetite, bloody stools.   Resp: No shortness of breath with exertion or at rest.  No excess mucus, no productive cough,  No non-productive cough,  No coughing up of blood.  No change in color of mucus.  No wheezing.  No chest wall deformity  Skin: no rash or lesions.  GU: no dysuria, change in color of urine, no urgency or frequency.  No flank pain, no hematuria   MS:  No joint pain or swelling.  No decreased range of motion.  No back pain.    Physical Exam  BP 123/84   Pulse 79   Ht 5\' 9"  (1.753 m)   Wt 162 lb (73.5 kg)   SpO2 96% Comment: RA  BMI 23.92 kg/m   GEN: A/Ox3; pleasant , NAD, well nourished    HEENT:  [place]/AT,  NOSE-clear, THROAT-clear, no lesions, no postnasal drip or exudate noted.   NECK:  Supple w/ fair ROM; no JVD; normal carotid impulses w/o bruits; no thyromegaly or nodules palpated; no lymphadenopathy.    RESP  Clear  P & A; w/o, wheezes/ rales/ or rhonchi. no accessory muscle use, no dullness to percussion  CARD:  RRR, no m/r/g, no peripheral edema, pulses intact, no cyanosis or clubbing.  GI:   Soft & nt; nml bowel sounds; no organomegaly or masses detected.   Musco: Warm bil, no deformities or joint swelling noted.   Neuro: alert, no focal deficits noted.    Skin: Warm, no lesions or rashes    Lab Results:    BNP No results found for:  "BNP"  ProBNP No results found for: "PROBNP"  Imaging: No results found.  Administration History     None          Latest Ref Rng & Units 02/26/2022    1:37 PM  PFT Results  FVC-Pre L 3.61   FVC-Predicted Pre % 91   FVC-Post L 3.51   FVC-Predicted Post % 89   Pre FEV1/FVC % % 86   Post FEV1/FCV % % 88   FEV1-Pre L 3.09   FEV1-Predicted Pre % 100   FEV1-Post L 3.10   DLCO uncorrected ml/min/mmHg 22.10   DLCO UNC% % 93   DLCO corrected ml/min/mmHg 22.10   DLCO COR %Predicted % 93   DLVA Predicted % 107   TLC L 5.07   TLC % Predicted % 89   RV % Predicted % 64     No results found for: "NITRICOXIDE"      Assessment & Plan:   Allergic asthma Mild persistent asthma currently under good control.  Patient has an allergic phenotype.  Continue on current regimen.  Previous PFTs showed normal lung function.  Plan  . Patient Instructions  Continue on TRELEGY 1 puff daily, rinse after use.  Albuterol inhaler As needed   Asthma action plan as discussed.   Zyrtec 10mg  daily  Saline nasal rinses As needed    Follow up with Dr. Tonia Brooms or Javier Mamone  6  months and As needed   Please contact office for sooner follow up if symptoms do not improve or worsen or seek emergency care     Allergic rhinitis Currently under good control continue on Zyrtec daily  Preop pulmonary/respiratory  exam Pulmonary preop risk assessment.  Patient is fully independent.  Asthma appears to be under good control with no recent flare.  Previous PFTs have showed normal lung function with no significant airflow obstruction or restriction.  She is compliant with her medication and is a never smoker.  From a pulmonary standpoint would be a lower risk as long as her asthma is under good control.  We discussed potential pulmonary risk with patient Recommend that she take her maintenance inhaler prior to surgery.  Albuterol as needed   Major Pulmonary risks identified in the multifactorial risk  analysis are but not limited to a) pneumonia; b) recurrent intubation risk; c) prolonged or recurrent acute respiratory failure needing mechanical ventilation; d) prolonged hospitalization; e) DVT/Pulmonary embolism; f) Acute Pulmonary edema  Recommend 1. Short duration of surgery as much as possible and avoid paralytic if possible  DVT prophylaxis per protocol .  Marland Kitchen Aggressive pulmonary toilet with o2, bronchodilatation, and incentive spirometry and early ambulation      Rubye Oaks, NP 08/14/2023

## 2023-08-14 NOTE — Assessment & Plan Note (Signed)
Currently under good control continue on Zyrtec daily

## 2023-08-14 NOTE — Patient Instructions (Addendum)
Continue on TRELEGY 1 puff daily, rinse after use.  Albuterol inhaler As needed   Asthma action plan as discussed.   Zyrtec 10mg  daily  Saline nasal rinses As needed    Follow up with Dr. Tonia Brooms or Arben Packman  6  months and As needed   Please contact office for sooner follow up if symptoms do not improve or worsen or seek emergency care

## 2023-08-15 ENCOUNTER — Ambulatory Visit: Payer: 59 | Admitting: Pulmonary Disease
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
## 2023-09-09 NOTE — Anesthesia Preprocedure Evaluation (Addendum)
Anesthesia Evaluation  Patient identified by MRN, date of birth, ID band Patient awake    Reviewed: Allergy & Precautions, NPO status , Patient's Chart, lab work & pertinent test results  History of Anesthesia Complications (+) PONV and history of anesthetic complications  Airway Mallampati: III  TM Distance: >3 FB Neck ROM: Full    Dental  (+) Dental Advisory Given   Pulmonary neg shortness of breath, asthma (exercise-induced) , neg sleep apnea, neg COPD, neg recent URI Chronic cough   Pulmonary exam normal breath sounds clear to auscultation       Cardiovascular (-) hypertension(-) angina (-) Past MI, (-) Cardiac Stents and (-) CABG (-) dysrhythmias  Rhythm:Regular Rate:Normal  HLD, 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 Left ventricular ejection fraction, by estimation, is 60 to 65%. The  left ventricle has normal function. The left ventricle has no regional  wall motion abnormalities. Left ventricular diastolic parameters were  normal.   2. Right ventricular systolic function is normal. The right ventricular  size is normal. There is normal pulmonary artery systolic pressure.   3. The mitral valve is normal in structure. No evidence of mitral valve  regurgitation. No evidence of mitral stenosis.   4. The aortic valve is tricuspid. Aortic valve regurgitation is not  visualized. No aortic stenosis is present.   5. The inferior vena cava is normal in size with greater than 50%  respiratory variability, suggesting right atrial pressure of 3 mmHg     Neuro/Psych  Headaches, neg Seizures PSYCHIATRIC DISORDERS Anxiety     H/o concussion, h/o TBI  Neuromuscular disease    GI/Hepatic negative GI ROS, Neg liver ROS,,,  Endo/Other  neg diabetesHypothyroidism    Renal/GU CRFRenal disease     Musculoskeletal   Abdominal   Peds  Hematology negative hematology ROS (+) Lab Results       Component                Value               Date                      WBC                      6.7                 08/11/2023                HGB                      12.2                08/11/2023                HCT                      36.9                08/11/2023                MCV                      100.8 (H)           08/11/2023                PLT  242                 08/11/2023              Anesthesia Other Findings   Reproductive/Obstetrics                             Anesthesia Physical Anesthesia Plan  ASA: 3  Anesthesia Plan: General and Regional   Post-op Pain Management: Regional block* and Tylenol PO (pre-op)*   Induction: Intravenous  PONV Risk Score and Plan: 4 or greater and Ondansetron, Dexamethasone, Midazolam and Treatment may vary due to age or medical condition  Airway Management Planned: LMA  Additional Equipment:   Intra-op Plan:   Post-operative Plan: Extubation in OR  Informed Consent: I have reviewed the patients History and Physical, chart, labs and discussed the procedure including the risks, benefits and alternatives for the proposed anesthesia with the patient or authorized representative who has indicated his/her understanding and acceptance.     Dental advisory given  Plan Discussed with: CRNA and Anesthesiologist  Anesthesia Plan Comments: (Discussed potential risks of nerve blocks including, but not limited to, infection, bleeding, nerve damage, seizures, pneumothorax, respiratory depression, and potential failure of the block. Alternatives to nerve blocks discussed. All questions answered.  Risks of general anesthesia discussed including, but not limited to, sore throat, hoarse voice, chipped/damaged teeth, injury to vocal cords, nausea and vomiting, allergic reactions, lung infection, heart attack, stroke, and death. All questions answered. )        Anesthesia Quick Evaluation

## 2023-09-10 ENCOUNTER — Encounter (HOSPITAL_BASED_OUTPATIENT_CLINIC_OR_DEPARTMENT_OTHER): Payer: Self-pay | Admitting: Orthopaedic Surgery

## 2023-09-10 ENCOUNTER — Other Ambulatory Visit: Payer: Self-pay

## 2023-09-10 NOTE — Discharge Instructions (Addendum)
Ramond Marrow MD, MPH Alfonse Alpers, PA-C [facility] [address], [address] [phone] (tel)   [phone] (fax)   POST-OPERATIVE INSTRUCTIONS - Multiligamentous RECONSTRUCTION  WOUND CARE You may remove the Operative Dressing on Post-Op Day #3 (72hrs after surgery).   Leave steri strips in place.   If you feel more comfortable with it you can leave all dressings in place till your 1 week follow-up appointment.   KEEP THE INCISIONS CLEAN AND DRY. An ACE wrap may be used to control swelling, do not wrap this too tight.  If the initial ACE wrap feels too tight or constricting you may loosen it. There may be a small amount of fluid/bleeding leaking at the surgical site.  This is normal; the knee is filled with fluid during the procedure and can leak for 24-48hrs after surgery.  You may change/reinforce the bandage as needed.  Use the Cryocuff, GameReady or Ice as often as possible for the first 3-4 days, then as needed for pain relief. Always keep a towel, ACE wrap or other barrier between the cooling unit and your skin.  You may shower on Post-Op Day #3. Gently pat the area dry.  Do not soak the knee in water.  Do not go swimming in the pool or ocean until 4 weeks after surgery or when otherwise instructed.  BRACE/AMBULATION Your leg will be placed in a brace post-operatively.  You may remove for hygiene only! You will need to wear your brace at all times until we discuss it further.  It should be locked in full extension (0 degrees) if adjustable.   You will be instructed on further bracing after your first visit. Use crutches for comfort and you will be non weight bearing   PHYSICAL THERAPY - You will begin physical therapy soon after surgery (unless otherwise specified) - Please call to set up an appointment, if you do not already have one  - Let our office if there are any issues with scheduling your therapy  - Our office will call you to set up  PT   REGIONAL ANESTHESIA (NERVE BLOCKS) The anesthesia team may have performed a nerve block for you this is a great tool used to minimize pain.   The block may start wearing off overnight (between 8-24 hours postop) When the block wears off, your pain may go from nearly zero to the pain you would have had postop without the block. This is an abrupt transition but nothing dangerous is happening.   This can be a challenging period but utilize your as needed pain medications to try and manage this period. We suggest you use the pain medication the first night prior to going to bed, to ease this transition.  You may take an extra dose of narcotic when this happens if needed  POST-OP MEDICATIONS- Multimodal approach to pain control In general your pain will be controlled with a combination of substances.  Prescriptions unless otherwise discussed are electronically sent to your pharmacy.  This is a carefully made plan we use to minimize narcotic use.     Celebrex - Anti-inflammatory medication taken on a scheduled basis Dilauded - This is a strong narcotic, to be used only on an "as needed" basis for SEVERE pain. Aspirin 81mg  - This medicine is used to minimize the risk of blood clots after surgery.   Zofran - take as needed for nausea   FOLLOW-UP Please call the office to schedule a follow-up appointment for your incision check  if you do not already have one, 7-10 days post-operatively. IF YOU HAVE ANY QUESTIONS, PLEASE FEEL FREE TO CALL OUR OFFICE.  HELPFUL INFORMATION   Keep your leg elevated to decrease swelling, which will then in turn decrease your pain. I would elevate the foot of your bed by putting a couple of couch pillows between your mattress and box spring. I would not keep pillow directly under your ankle.  You must wear the brace locked while sleeping and ambulating until follow-up.   There will be MORE swelling on days 1-3 than there is on the day of surgery.  This also is  normal. The swelling will decrease with the anti-inflammatory medication, ice and keeping it elevated. The swelling will make it more difficult to bend your knee. As the swelling goes down your motion will become easier  You may develop swelling and bruising that extends from your knee down to your calf and perhaps even to your foot over the next week. Do not be alarmed. This too is normal, and it is due to gravity  There may be some numbness adjacent to the incision site. This may last for 6-12 months or longer in some patients and is expected.  You may return to sedentary work/school in the next couple of days when you feel up to it. You will need to keep your leg elevated as much as possible   You should wean off your narcotic medicines as soon as you are able.  Most patients will be off narcotics before their first postop appointment.   We suggest you use the pain medication the first night prior to going to bed, in order to ease any pain when the anesthesia wears off. You should avoid taking pain medications on an empty stomach as it will make you nauseous.  Do not drink alcoholic beverages or take illicit drugs when taking pain medications.  It is against the law to drive while taking narcotics. You cannot drive if your Right leg is in brace locked in extension.  Pain medication may make you constipated.  Below are a few solutions to try in this order: Decrease the amount of pain medication if you aren't having pain. Drink lots of decaffeinated fluids. Drink prune juice and/or each dried prunes  If the first 3 don't work start with additional solutions Take Colace - an over-the-counter stool softener Take Senokot - an over-the-counter laxative Take Miralax - a stronger over-the-counter laxative  For more information including helpful videos and documents visit our website:   [email or web address]

## 2023-09-11 ENCOUNTER — Ambulatory Visit (HOSPITAL_BASED_OUTPATIENT_CLINIC_OR_DEPARTMENT_OTHER): Payer: 59

## 2023-09-11 ENCOUNTER — Ambulatory Visit (HOSPITAL_BASED_OUTPATIENT_CLINIC_OR_DEPARTMENT_OTHER): Payer: 59 | Admitting: Anesthesiology

## 2023-09-11 ENCOUNTER — Encounter (HOSPITAL_BASED_OUTPATIENT_CLINIC_OR_DEPARTMENT_OTHER)
Admission: RE | Disposition: A | Discharge status: Home / Self Care | Payer: Self-pay | Source: Home / Self Care | Attending: Orthopaedic Surgery

## 2023-09-11 ENCOUNTER — Other Ambulatory Visit: Payer: Self-pay

## 2023-09-11 ENCOUNTER — Ambulatory Visit (HOSPITAL_BASED_OUTPATIENT_CLINIC_OR_DEPARTMENT_OTHER)
Admission: RE | Admit: 2023-09-11 | Discharge: 2023-09-11 | Disposition: A | Discharge status: Home / Self Care | Payer: 59 | Attending: Orthopaedic Surgery | Admitting: Orthopaedic Surgery

## 2023-09-11 ENCOUNTER — Encounter (HOSPITAL_BASED_OUTPATIENT_CLINIC_OR_DEPARTMENT_OTHER): Payer: Self-pay | Admitting: Orthopaedic Surgery

## 2023-09-11 DIAGNOSIS — X58XXXA Exposure to other specified factors, initial encounter: Secondary | ICD-10-CM | POA: Diagnosis not present

## 2023-09-11 DIAGNOSIS — R0609 Other forms of dyspnea: Secondary | ICD-10-CM

## 2023-09-11 DIAGNOSIS — S83512A Sprain of anterior cruciate ligament of left knee, initial encounter: Secondary | ICD-10-CM | POA: Insufficient documentation

## 2023-09-11 DIAGNOSIS — Z7722 Contact with and (suspected) exposure to environmental tobacco smoke (acute) (chronic): Secondary | ICD-10-CM | POA: Insufficient documentation

## 2023-09-11 DIAGNOSIS — S83282A Other tear of lateral meniscus, current injury, left knee, initial encounter: Secondary | ICD-10-CM | POA: Insufficient documentation

## 2023-09-11 DIAGNOSIS — S83412A Sprain of medial collateral ligament of left knee, initial encounter: Secondary | ICD-10-CM | POA: Insufficient documentation

## 2023-09-11 DIAGNOSIS — E039 Hypothyroidism, unspecified: Secondary | ICD-10-CM | POA: Diagnosis not present

## 2023-09-11 HISTORY — PX: MEDIAL COLLATERAL LIGAMENT REPAIR, KNEE: SHX2019

## 2023-09-11 HISTORY — DX: Hypothyroidism, unspecified: E03.9

## 2023-09-11 HISTORY — PX: KNEE RECONSTRUCTION: SHX5883

## 2023-09-11 HISTORY — PX: KNEE ARTHROSCOPY WITH LATERAL MENISECTOMY: SHX6193

## 2023-09-11 SURGERY — ARTHROSCOPY, KNEE, WITH LATERAL MENISCECTOMY
Anesthesia: Regional | Site: Knee | Laterality: Left

## 2023-09-11 MED ORDER — CEFAZOLIN SODIUM-DEXTROSE 2-4 GM/100ML-% IV SOLN
INTRAVENOUS | Status: AC
  Filled 2023-09-11: qty 100

## 2023-09-11 MED ORDER — PROPOFOL 10 MG/ML IV BOLUS
INTRAVENOUS | Status: DC | PRN
  Administered 2023-09-11: 200 mg via INTRAVENOUS

## 2023-09-11 MED ORDER — SODIUM CHLORIDE 0.9 % IV SOLN: INTRAVENOUS | Status: DC | PRN

## 2023-09-11 MED ORDER — FENTANYL CITRATE (PF) 100 MCG/2ML IJ SOLN
100.0000 ug | Freq: Once | INTRAMUSCULAR | Status: AC
  Administered 2023-09-11: 50 ug via INTRAVENOUS

## 2023-09-11 MED ORDER — FENTANYL CITRATE (PF) 100 MCG/2ML IJ SOLN
INTRAMUSCULAR | Status: AC
  Filled 2023-09-11: qty 2

## 2023-09-11 MED ORDER — PROPOFOL 10 MG/ML IV BOLUS
INTRAVENOUS | Status: AC
Start: 2023-09-11 — End: ?
  Filled 2023-09-11: qty 20

## 2023-09-11 MED ORDER — MIDAZOLAM HCL 5 MG/5ML IJ SOLN
INTRAMUSCULAR | Status: DC | PRN
  Administered 2023-09-11: 1 mg via INTRAVENOUS

## 2023-09-11 MED ORDER — LIDOCAINE 2% (20 MG/ML) 5 ML SYRINGE
INTRAMUSCULAR | Status: AC
Start: 2023-09-11 — End: ?
  Filled 2023-09-11: qty 5

## 2023-09-11 MED ORDER — SCOPOLAMINE 1 MG/3DAYS TD PT72
1.0000 | MEDICATED_PATCH | TRANSDERMAL | Status: DC
  Administered 2023-09-11: 1.5 mg via TRANSDERMAL

## 2023-09-11 MED ORDER — MIDAZOLAM HCL 2 MG/2ML IJ SOLN
INTRAMUSCULAR | Status: AC
  Filled 2023-09-11: qty 2

## 2023-09-11 MED ORDER — PROPOFOL 500 MG/50ML IV EMUL
INTRAVENOUS | Status: DC | PRN
  Administered 2023-09-11: 150 ug/kg/min via INTRAVENOUS

## 2023-09-11 MED ORDER — ONDANSETRON HCL 4 MG/2ML IJ SOLN
INTRAMUSCULAR | Status: AC
  Filled 2023-09-11: qty 2

## 2023-09-11 MED ORDER — ACETAMINOPHEN 500 MG PO TABS
ORAL_TABLET | ORAL | Status: AC
  Filled 2023-09-11: qty 2

## 2023-09-11 MED ORDER — ROPIVACAINE HCL 5 MG/ML IJ SOLN
INTRAMUSCULAR | Status: DC | PRN
  Administered 2023-09-11: 20 mL via PERINEURAL

## 2023-09-11 MED ORDER — PANTOPRAZOLE SODIUM 20 MG PO TBEC: 20.0000 mg | DELAYED_RELEASE_TABLET | Freq: Every day | ORAL | 0 refills | Status: DC

## 2023-09-11 MED ORDER — LIDOCAINE HCL (CARDIAC) PF 100 MG/5ML IV SOSY
PREFILLED_SYRINGE | INTRAVENOUS | Status: DC | PRN
  Administered 2023-09-11: 50 mg via INTRAVENOUS

## 2023-09-11 MED ORDER — ONDANSETRON HCL 4 MG/2ML IJ SOLN
INTRAMUSCULAR | Status: DC | PRN
  Administered 2023-09-11: 4 mg via INTRAVENOUS

## 2023-09-11 MED ORDER — CEFAZOLIN SODIUM-DEXTROSE 2-4 GM/100ML-% IV SOLN
2.0000 g | INTRAVENOUS | Status: AC
  Administered 2023-09-11: 2 g via INTRAVENOUS

## 2023-09-11 MED ORDER — HYDROMORPHONE HCL 2 MG PO TABS: 2.0000 mg | ORAL_TABLET | Freq: Four times a day (QID) | ORAL | 0 refills | Status: AC | PRN

## 2023-09-11 MED ORDER — VANCOMYCIN HCL 1000 MG IV SOLR
INTRAVENOUS | Status: DC | PRN
  Administered 2023-09-11: 1000 mg via TOPICAL

## 2023-09-11 MED ORDER — HYDROMORPHONE HCL 1 MG/ML IJ SOLN
INTRAMUSCULAR | Status: AC
  Filled 2023-09-11: qty 0.5

## 2023-09-11 MED ORDER — MIDAZOLAM HCL 2 MG/2ML IJ SOLN
2.0000 mg | Freq: Once | INTRAMUSCULAR | Status: AC
  Administered 2023-09-11: 2 mg via INTRAVENOUS

## 2023-09-11 MED ORDER — HYDROMORPHONE HCL 1 MG/ML IJ SOLN
INTRAMUSCULAR | Status: DC | PRN
  Administered 2023-09-11: .5 mg via INTRAVENOUS

## 2023-09-11 MED ORDER — SODIUM CHLORIDE 0.9 % IR SOLN
Status: DC | PRN
  Administered 2023-09-11: 9000 mL

## 2023-09-11 MED ORDER — ACETAMINOPHEN 500 MG PO TABS
1000.0000 mg | ORAL_TABLET | Freq: Once | ORAL | Status: AC
  Administered 2023-09-11: 1000 mg via ORAL

## 2023-09-11 MED ORDER — ASPIRIN 81 MG PO TBEC: 81.0000 mg | DELAYED_RELEASE_TABLET | Freq: Every day | ORAL | 2 refills | Status: DC

## 2023-09-11 MED ORDER — DEXAMETHASONE SODIUM PHOSPHATE 10 MG/ML IJ SOLN
INTRAMUSCULAR | Status: DC | PRN
  Administered 2023-09-11: 5 mg via INTRAVENOUS

## 2023-09-11 MED ORDER — DEXAMETHASONE SODIUM PHOSPHATE 10 MG/ML IJ SOLN
INTRAMUSCULAR | Status: AC
  Filled 2023-09-11: qty 1

## 2023-09-11 MED ORDER — FENTANYL CITRATE (PF) 100 MCG/2ML IJ SOLN
INTRAMUSCULAR | Status: DC | PRN
  Administered 2023-09-11 (×2): 50 ug via INTRAVENOUS

## 2023-09-11 MED ORDER — ONDANSETRON HCL 4 MG PO TABS: 4.0000 mg | ORAL_TABLET | Freq: Three times a day (TID) | ORAL | 0 refills | Status: AC | PRN

## 2023-09-11 SURGICAL SUPPLY — 112 items
ANCH FIBERTAK DL SP W/NDL 2.6 (Anchor) ×4 IMPLANT
ANCH SUT FBRTK 2 LD NDL (Anchor) ×4 IMPLANT
ANCHOR FIBRTK DL SP W/NDL 2.6 (Anchor) IMPLANT
ANCHOR TIGHTROPE II 20 W/IB (Anchor) IMPLANT
APL PRP STRL LF DISP 70% ISPRP (MISCELLANEOUS) ×2
BANDAGE ESMARK 6X9 LF (GAUZE/BANDAGES/DRESSINGS) IMPLANT
BLADE AVERAGE 25X9 (BLADE) IMPLANT
BLADE EXCALIBUR 4.0X13 (MISCELLANEOUS) IMPLANT
BLADE SHAVER BONE 5.0X13 (MISCELLANEOUS) ×2 IMPLANT
BLADE SURG 10 STRL SS (BLADE) ×2 IMPLANT
BLADE SURG 15 STRL LF DISP TIS (BLADE) ×2 IMPLANT
BLADE SURG 15 STRL SS (BLADE) ×2
BNDG CMPR 5X4 CHSV STRCH STRL (GAUZE/BANDAGES/DRESSINGS)
BNDG CMPR 5X4 KNIT ELC UNQ LF (GAUZE/BANDAGES/DRESSINGS) ×2
BNDG CMPR 6 X 5 YARDS HK CLSR (GAUZE/BANDAGES/DRESSINGS) ×2
BNDG CMPR 9X6 STRL LF SNTH (GAUZE/BANDAGES/DRESSINGS)
BNDG COHESIVE 4X5 TAN STRL LF (GAUZE/BANDAGES/DRESSINGS) IMPLANT
BNDG ELASTIC 4INX 5YD STR LF (GAUZE/BANDAGES/DRESSINGS) ×2 IMPLANT
BNDG ELASTIC 6INX 5YD STR LF (GAUZE/BANDAGES/DRESSINGS) ×2 IMPLANT
BNDG ESMARK 6X9 LF (GAUZE/BANDAGES/DRESSINGS)
BONE TUNNEL PLUG CANNULATED (MISCELLANEOUS) IMPLANT
BURR OVAL 8 FLU 4.0X13 (MISCELLANEOUS) IMPLANT
CHLORAPREP W/TINT 26 (MISCELLANEOUS) ×2 IMPLANT
CLSR STERI-STRIP ANTIMIC 1/2X4 (GAUZE/BANDAGES/DRESSINGS) ×2 IMPLANT
COLLECTOR GRAFT TISSUE (SYSTAGENIX WOUND MANAGEMENT)
COOLER ICEMAN CLASSIC (MISCELLANEOUS) ×2 IMPLANT
COVER BACK TABLE 60X90IN (DRAPES) ×2 IMPLANT
CUFF TOURN SGL QUICK 34 (TOURNIQUET CUFF) ×2
CUFF TRNQT CYL 34X4.125X (TOURNIQUET CUFF) ×2 IMPLANT
CUTTER TENSIONER SUT 2-0 0 FBW (INSTRUMENTS) IMPLANT
DISSECTOR 3.5MM X 13CM CVD (MISCELLANEOUS) IMPLANT
DISSECTOR 4.0MMX13CM CVD (MISCELLANEOUS) ×2 IMPLANT
DRAPE IMP U-DRAPE 54X76 (DRAPES) IMPLANT
DRAPE INCISE IOBAN 66X45 STRL (DRAPES) IMPLANT
DRAPE OEC MINIVIEW 54X84 (DRAPES) ×2 IMPLANT
DRAPE U-SHAPE 47X51 STRL (DRAPES) ×2 IMPLANT
DRAPE-T ARTHROSCOPY W/POUCH (DRAPES) ×2 IMPLANT
ELECT REM PT RETURN 9FT ADLT (ELECTROSURGICAL) ×2
ELECTRODE REM PT RTRN 9FT ADLT (ELECTROSURGICAL) ×2 IMPLANT
FIBERSTICK 2 (SUTURE) IMPLANT
GAUZE SPONGE 4X4 12PLY STRL (GAUZE/BANDAGES/DRESSINGS) ×4 IMPLANT
GAUZE XEROFORM 1X8 LF (GAUZE/BANDAGES/DRESSINGS) IMPLANT
GLOVE BIO SURGEON STRL SZ 6.5 (GLOVE) ×2 IMPLANT
GLOVE BIO SURGEON STRL SZ7 (GLOVE) IMPLANT
GLOVE BIOGEL PI IND STRL 6.5 (GLOVE) ×2 IMPLANT
GLOVE BIOGEL PI IND STRL 7.0 (GLOVE) IMPLANT
GLOVE BIOGEL PI IND STRL 8 (GLOVE) ×2 IMPLANT
GLOVE ECLIPSE 8.0 STRL XLNG CF (GLOVE) ×2 IMPLANT
GLOVE SURG SS PI 7.0 STRL IVOR (GLOVE) IMPLANT
GOWN STRL REUS W/ TWL LRG LVL3 (GOWN DISPOSABLE) ×4 IMPLANT
GOWN STRL REUS W/TWL LRG LVL3 (GOWN DISPOSABLE) ×4
GOWN STRL REUS W/TWL XL LVL3 (GOWN DISPOSABLE) ×2 IMPLANT
GRAFT ACHILLES CALC BNE BLCK (Bone Implant) IMPLANT
GRAFT ACHILLES TENDON (Bone Implant) ×2 IMPLANT
GRAFT TISS 230-320 GRACILIS (Bone Implant) IMPLANT
GRAFT TISS ANT TIB TNDN (Tissue) IMPLANT
IMMOBILIZER KNEE 20 (SOFTGOODS)
IMMOBILIZER KNEE 20 THIGH 36 (SOFTGOODS) IMPLANT
IMMOBILIZER KNEE 22 UNIV (SOFTGOODS) IMPLANT
IMMOBILIZER KNEE 24 THIGH 36 (MISCELLANEOUS) IMPLANT
IMMOBILIZER KNEE 24 UNIV (MISCELLANEOUS)
IV NS IRRIG 3000ML ARTHROMATIC (IV SOLUTION) ×8 IMPLANT
KIT TRANSTIBIAL (DISPOSABLE) ×2 IMPLANT
NDL SAFETY ECLIPSE 18X1.5 (NEEDLE) ×2 IMPLANT
NDL SUT 2-0 SCORPION KNEE (NEEDLE) IMPLANT
NEEDLE SUT 2-0 SCORPION KNEE (NEEDLE)
NS IRRIG 1000ML POUR BTL (IV SOLUTION) ×2 IMPLANT
PACK ARTHROSCOPY DSU (CUSTOM PROCEDURE TRAY) ×2 IMPLANT
PACK BASIN DAY SURGERY FS (CUSTOM PROCEDURE TRAY) ×2 IMPLANT
PAD CAST 4YDX4 CTTN HI CHSV (CAST SUPPLIES) ×2 IMPLANT
PAD COLD SHLDR WRAP-ON (PAD) ×2 IMPLANT
PADDING CAST COTTON 4X4 STRL (CAST SUPPLIES) ×2
PADDING CAST COTTON 6X4 STRL (CAST SUPPLIES) IMPLANT
PENCIL SMOKE EVACUATOR (MISCELLANEOUS) ×2 IMPLANT
PIN DRILL ACL TIGHTROPE 4MM (PIN) IMPLANT
PORTAL SKID DEVICE (INSTRUMENTS) IMPLANT
RETRIEVER SUT HEWSON (MISCELLANEOUS) IMPLANT
SCREW FT BIOCOMP 9X30 (Screw) IMPLANT
SCREW LO PRO 25MM (Screw) IMPLANT
SCREW SHEATHED INTERF 9X20MM (Screw) IMPLANT
SHEET MEDIUM DRAPE 40X70 STRL (DRAPES) ×2 IMPLANT
SLEEVE SCD COMPRESS KNEE MED (STOCKING) ×2 IMPLANT
SPIKE FLUID TRANSFER (MISCELLANEOUS) IMPLANT
SPONGE T-LAP 18X18 ~~LOC~~+RFID (SPONGE) ×2 IMPLANT
SPONGE T-LAP 4X18 ~~LOC~~+RFID (SPONGE) ×2 IMPLANT
SUT 0 FIBERLOOP 38 BLUE TPR ND (SUTURE)
SUT 2 FIBERLOOP 20 STRT BLUE (SUTURE) ×8
SUT FIBERWIRE #2 38 REV NDL BL (SUTURE) ×2
SUT FIBERWIRE #2 38 T-5 BLUE (SUTURE)
SUT MNCRL AB 4-0 PS2 18 (SUTURE) ×2 IMPLANT
SUT PDS AB 0 CT 36 (SUTURE) IMPLANT
SUT VIC AB 0 CT1 27 (SUTURE) ×6
SUT VIC AB 0 CT1 27XBRD ANBCTR (SUTURE) ×4 IMPLANT
SUT VIC AB 1 CT1 27 (SUTURE)
SUT VIC AB 1 CT1 27XBRD ANBCTR (SUTURE) IMPLANT
SUT VIC AB 3-0 SH 27 (SUTURE) ×4
SUT VIC AB 3-0 SH 27X BRD (SUTURE) ×2 IMPLANT
SUTURE 0 FIBERLP 38 BLU TPR ND (SUTURE) IMPLANT
SUTURE 2 FIBERLOOP 20 STRT BLU (SUTURE) IMPLANT
SUTURE FIBERWR #2 38 T-5 BLUE (SUTURE) IMPLANT
SUTURE FIBERWR#2 38 REV NDL BL (SUTURE) ×2 IMPLANT
SUTURE TAPE 1.3 FIBERLOP 20 ST (SUTURE) ×2 IMPLANT
SUTURETAPE 1.3 FIBERLOOP 20 ST (SUTURE) ×2
TENDON ANTERIOR TIBIALIS (Tissue) ×2 IMPLANT
TENDON GRACILIS FROZEN (Bone Implant) ×2 IMPLANT
TISSUE GRAFT COLLECTOR (SYSTAGENIX WOUND MANAGEMENT) IMPLANT
TOWEL GREEN STERILE FF (TOWEL DISPOSABLE) ×2 IMPLANT
TUBE CONNECTING 20X1/4 (TUBING) ×2 IMPLANT
TUBE SUCTION HIGH CAP CLEAR NV (SUCTIONS) ×2 IMPLANT
TUBING ARTHROSCOPY IRRIG 16FT (MISCELLANEOUS) ×2 IMPLANT
WAND ABLATOR APOLLO I90 (BUR) ×2 IMPLANT
WASHER SPIKED 14 F/CANC SCRW (Washer) IMPLANT

## 2023-09-11 NOTE — Transfer of Care (Signed)
Immediate Anesthesia Transfer of Care Note  Patient: Nicole Waller  Procedure(s) Performed: KNEE ARTHROSCOPY WITH LATERAL MENISECTOMY (Left: Knee) KNEE ARTHROSCOPY WITH ANTERIOR CRUCIATE LIGAMENT (ACL) RECONSTRUCTION WITH ANTERIOR TIBIAL ALLOGRAFT (Left: Knee) KNEE RECONSTRUCTION MEDIAL COLLATERAL LIGAMENT WITH ACHILLES ALLOGRAFT (Left: Knee) MEDIAL CAPSULE REPAIR (Knee)  Patient Location: PACU  Anesthesia Type:General  Level of Consciousness: awake  Airway & Oxygen Therapy: Patient Spontanous Breathing  Post-op Assessment: Report given to RN and Post -op Vital signs reviewed and stable  Post vital signs: Reviewed and stable  Last Vitals:  Vitals Value Taken Time  BP 121/73 09/11/23 1345  Temp 36.2 C 09/11/23 1242  Pulse 77 09/11/23 1348  Resp 13 09/11/23 1347  SpO2 97 % 09/11/23 1348  Vitals shown include unfiled device data.  Last Pain:  Vitals:   09/11/23 1315  TempSrc:   PainSc: Asleep      Patients Stated Pain Goal: 4 (09/11/23 0837)  Complications: No notable events documented.

## 2023-09-11 NOTE — Anesthesia Postprocedure Evaluation (Signed)
Anesthesia Post Note  Patient: Nicole Waller  Procedure(s) Performed: KNEE ARTHROSCOPY WITH LATERAL MENISECTOMY (Left: Knee) KNEE ARTHROSCOPY WITH ANTERIOR CRUCIATE LIGAMENT (ACL) RECONSTRUCTION WITH ANTERIOR TIBIAL ALLOGRAFT (Left: Knee) KNEE RECONSTRUCTION MEDIAL COLLATERAL LIGAMENT WITH ACHILLES ALLOGRAFT (Left: Knee) MEDIAL CAPSULE REPAIR (Knee)     Patient location during evaluation: PACU Anesthesia Type: Regional and General Level of consciousness: awake Pain management: pain level controlled Vital Signs Assessment: post-procedure vital signs reviewed and stable Respiratory status: spontaneous breathing, nonlabored ventilation and respiratory function stable Cardiovascular status: blood pressure returned to baseline and stable Postop Assessment: no apparent nausea or vomiting Anesthetic complications: no   No notable events documented.  Last Vitals:  Vitals:   09/11/23 1330 09/11/23 1345  BP: 121/81 121/73  Pulse: 74 74  Resp: 15 14  Temp:    SpO2: 94% 98%    Last Pain:  Vitals:   09/11/23 1315  TempSrc:   PainSc: Asleep                 Linton Rump

## 2023-09-11 NOTE — Interval H&P Note (Signed)
All questions answered, patient wants to proceed with procedure. ? ?

## 2023-09-11 NOTE — Progress Notes (Signed)
Assisted Dr. Jennifer Allan with left, adductor canal, ultrasound guided block. Side rails up, monitors on throughout procedure. See vital signs in flow sheet. Tolerated Procedure well. 

## 2023-09-11 NOTE — Anesthesia Procedure Notes (Addendum)
Anesthesia Regional Block: Adductor canal block   Pre-Anesthetic Checklist: , timeout performed,  Correct Patient, Correct Site, Correct Laterality,  Correct Procedure, Correct Position, site marked,  Risks and benefits discussed,  Surgical consent,  Pre-op evaluation,  At surgeon's request and post-op pain management  Laterality: Left  Prep: chloraprep       Needles:  Injection technique: Single-shot  Needle Type: Echogenic Stimulator Needle     Needle Length: 9cm  Needle Gauge: 21     Additional Needles:   Procedures:,,,, ultrasound used (permanent image in chart),,    Narrative:  Start time: 09/11/2023 9:30 AM End time: 09/11/2023 9:33 AM Injection made incrementally with aspirations every 5 mL.  Performed by: Personally  Anesthesiologist: Linton Rump, MD  Additional Notes: Discussed risks and benefits of nerve block including, but not limited to, prolonged and/or permanent nerve injury involving sensory and/or motor function. Monitors were applied and a time-out was performed. The nerve and associated structures were visualized under ultrasound guidance. After negative aspiration, local anesthetic was slowly injected around the nerve. There was no evidence of high pressure during the procedure. There were no paresthesias. VSS remained stable and the patient tolerated the procedure well.

## 2023-09-12 ENCOUNTER — Encounter (HOSPITAL_BASED_OUTPATIENT_CLINIC_OR_DEPARTMENT_OTHER): Payer: Self-pay | Admitting: Orthopaedic Surgery

## 2023-09-16 NOTE — Op Note (Signed)
Orthopaedic Surgery Operative Note (CSN: [id number])  Nicole Waller  1966-02-03 Date of Surgery: 09/11/2023   Diagnoses:  LEFT KNEE LATERAL MENISCUS TEAR, ACL TEAR, MCL TEAR  Procedure: Left ACL reconstruction with allograft Left medial collateral ligament repair Left MCL reconstruction with Achilles allograft Left lateral partial meniscectomy   Operative Finding Grossly unstable joint to Lachman and valgus testing.  Lateral meniscus was a complex tear near the root that was debrided back to stable base, 30% total meniscal volume resected.  Medial meniscus looked normal. ACL and MCL reconstruction was routine, patient will follow multiligamentous protocol  Successful completion of the planned procedure.    Post-operative plan: The patient will be TDWB.  The patient will be dc home.  DVT prophylaxis Aspirin 81 mg twice daily for 6 weeks.  Pain control with PRN pain medication preferring oral medicines.  Follow up plan will be scheduled in approximately 7 days for incision check and XR.  Post-Op Diagnosis: Same Surgeons:Primary: Bjorn Pippin, MD Assistants:Deb Lizbeth Bark Location: [facility] OR ROOM 5 Anesthesia: General with adductor Antibiotics: Ancef 2 g with local vancomycin powder 1 g at the surgical site Tourniquet time:  Total Tourniquet Time Documented: Thigh (Left) - 89 minutes Total: Thigh (Left) - 89 minutes  Estimated Blood Loss: Minimal Complications: None Specimens: None Implants: Implant Name Type Inv. Item Serial No. Manufacturer Lot No. LRB No. Used Action  GRAFT ACHILLES TENDON - [id number] Bone Implant GRAFT ACHILLES TENDON [id number] [facility] [id number] Left 1 Implanted  TENDON ANTERIOR TIBIALIS - [id number] Tissue TENDON ANTERIOR TIBIALIS [id number] [employer] [id number] Left 1 Implanted  TENDON GRACILIS FROZEN - [id number] Bone Implant TENDON GRACILIS FROZEN [id number] [employer] [id number] Left 1 Implanted  [facility] SUT FBRTK 2  LD NDL - [id number] Anchor ANCH SUT FBRTK 2 LD NDL  [employer] [id number] Left 1 Implanted  ANCH SUT FBRTK 2 LD NDL - [id number] Anchor ANCH SUT FBRTK 2 LD NDL  [employer] [id number] Left 1 Implanted  ANCHOR TIGHTROPE II 20 W/IB - [id number] Anchor ANCHOR TIGHTROPE II 20 W/IB  [employer] [id number] Left 1 Implanted  SCREW SHEATHED INTERF 9X20MM - [id number] Screw SCREW SHEATHED INTERF 9X20MM  [employer] [id number] Left 1 Implanted  WASHER SPIKED 14 F/CANC SCRW - [id number] Washer WASHER SPIKED 14 F/CANC SCRW  [employer] [id number] Left 1 Implanted  SCREW LO PRO - [id number] Screw SCREW LO PRO  [employer] [id number] Left 1 Implanted  SCREW FT BIOCOMP 9X30 - [id number] Screw SCREW FT BIOCOMP 9X30  [employer] [id number] Left 1 Implanted    Indications for Surgery:   Nicole Waller is a 57 y.o. female with multiligamentous injury to the knee.  Benefits and risks of operative and nonoperative management were discussed prior to surgery with patient/guardian(s) and informed consent form was completed.  Specific risks including infection, need for additional surgery, recurrent instability, stiffness amongst others.   Procedure:   The patient was identified properly. Informed consent was obtained and the surgical site was marked. The patient was taken up to suite where general anesthesia was induced. The patient was placed in the supine position with a post against the surgical leg and a nonsterile tourniquet applied. The surgical leg was then prepped and draped usual sterile fashion.  A standard surgical timeout was performed.  2 standard anterior portals were made and diagnostic arthroscopy performed. Please note the findings as noted above.  A tibialis anterior graft was prepared on the back table to a size of 9  mm and 100 mm in length  We began arthroscopy and made our lateral and medial portals in the typical fashion. Fat pad was resected and diagnostic arthroscopy performed with the findings listed  above.   The anterior cruciate ligament stump was debrided utilizing a shaver taking great care to preserve the remnant stump on the femur and the tibia for localization of our tunnels. Once the remnant anterior cruciate ligament was removed and we obtained appropriate visualization by performing a small notchplasty and confirmed that we had indeed identify the over-the-top position. We made small marks at the location of the aperture of the tibial and femoral tunnels and double checked our location prior to drilling.  Once we identified the appropriate position of the femoral aperture of the ACL we hyperflexed the knee and used a 7 mm offset guide through an anterior medial portal to drill a pin.  We then reamed a 9 mm tunnel by 30 mm in length.  We overreamed from the lateral side with a 4 mm reamer to allow passage of our button.  We shuttled a passing suture.  We then turned our attention to the tibial side.  We cleared the tissue from the tibial aperture using the anterior horn of the lateral meniscus as a guide.  We were able to then use a freehand technique to place a 2 4 pin in the appropriate anatomic position.  We reamed a complete tunnel.  We shuttled our suture for eventual passage.  We turned our attention to the MCL.  MCL Reconstruction: We began with a incision starting at the medial epicondyle and going down just distal to the pedis anserine [place].  We dissected through soft tissues bluntly take care to protect posterior structures including the saphenous nerve and vein.  We achieved hemostasis to be progressed were able to identify the distal insertion of the MCL.  There is a complete MCL rupture noted with significant soft tissue trauma in this area.  Joint line was palpated and were able to find the epicondyle within incision.  At this point we placed a 2.4 mm guidepin just proximal and posterior to the epicondyle and the MCL origin and used fluoroscopy to verify its position.  Once  were happy with this position we then placed another guidewire at the insertion on the tibia and checked for graft isometry which was appropriate with less than 2 mm of and isometry.  We then removed the tibial pin and reamed a 25 mm deep 10 mm tunnel into the femur.  It was cleared of soft tissue.  We had good walls on all sides of the tunnel.  We then prepped the graft in the form of allograft with a 10 mm bone block and the full length of the Achilles soft tissue itself.  We used a bone tamp to place the bone block which was 20 mm in length into our tunnel in the femur and it was secured with a 7 x 20 mm metal screw.  We then turned our attention to the fixation for the deep MCL as well as the superficial MCL along the tibia.  We placed 1 double loaded fiber tack anchors 1 cm proximal to the joint line on the femur and two 1 cm distal to the joint line on the tibia.    We used the proximal anchor to repair the native MCL with the knee in 30 degrees of flexion and varus and used the stitches to continue to recreate the deep MCL  with our graft.  These were double loaded suture anchors in each limb was passed in a Mason-Allen type fashion to tension the graft as it was tied with the remaining limb as post.  Once all the limbs were passed we then began tying starting near the joint and progressing away from the joint as we proceeded.  We held the knee in slight varus with 20 degrees of flexion to avoid capturing the knee while tying these knots.  We then turned our attention to the distal most fixation.  We cleared an area distal to the pes tendons and used an arthrex soft tissue screw and washer construct to drill then place a cancellous screw holding tension on the graft as we tightened the screw.  The sutures were cut and we imbricated the remaining native MCL tissue into our graft.  We had a robust fixation and good stability to a valgus stress that was gently placed in the operating room at 0,30, and  60 degrees.  Attention turned back to ACL.  We began passing the graft through the tibial tunnel in an antegrade fashion.  Femoral fixation was with a tight rope device and we checked its placement on the lateral periosteum with fluoroscopy.  We verified arthroscopically that there is no sign of graft impingement on the notch. We then cycled the knee multiple times and turned our attention to the tibia.  Tibia was fixed with a 10x 30 mm bio composite Arthrex screw.  There was good purchase of the screw and the screw protrusion thus we felt there is no need to back up this fixation.  At this point a gentle Lachman maneuver was performed and there is a stable endpoint and no translation.   Incisions closed with absorbable suture. The patient was awoken from general anesthesia and taken to the PACU in stable condition without complication.   Sander Radon, PA-C, present and scrubbed throughout the case, critical for completion in a timely fashion, and for retraction, instrumentation, closure.

## 2023-10-23 ENCOUNTER — Ambulatory Visit: Payer: 59 | Admitting: Cardiovascular Disease

## 2023-10-23 HISTORY — PX: CATARACT EXTRACTION: SUR2

## 2023-10-29 ENCOUNTER — Other Ambulatory Visit: Payer: Self-pay | Admitting: Nurse Practitioner

## 2023-10-30 HISTORY — PX: CATARACT EXTRACTION: SUR2

## 2023-11-10 ENCOUNTER — Other Ambulatory Visit: Payer: Self-pay | Admitting: Pulmonary Disease

## 2023-11-10 DIAGNOSIS — J4520 Mild intermittent asthma, uncomplicated: Secondary | ICD-10-CM

## 2023-12-07 ENCOUNTER — Other Ambulatory Visit: Payer: Self-pay | Admitting: Adult Health

## 2023-12-22 LAB — RESULTS CONSOLE HPV: CHL HPV: NEGATIVE

## 2023-12-22 LAB — HM PAP SMEAR: HM Pap smear: NORMAL

## 2023-12-23 ENCOUNTER — Other Ambulatory Visit: Payer: Self-pay | Admitting: Obstetrics and Gynecology

## 2023-12-23 DIAGNOSIS — Z803 Family history of malignant neoplasm of breast: Secondary | ICD-10-CM

## 2023-12-26 NOTE — Progress Notes (Signed)
CARDIOLOGY CONSULT NOTE       Patient ID: RYLI STANDLEE MRN: [id number] DOB/AGE: 1966-07-27 58 y.o.  Referring Physician: Oneta Rack Primary Physician: Lucky Cowboy, MD Primary Cardiologist: Eden Emms Reason for Consultation: Aortic Aneurysm   HPI:  58 y.o. 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 Married to husband 18 years older He is from [employer] and 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   Had left knee scope with Dr Everardo Pacific 09/11/23 for repair of ACL/MCL reconstruction and partial meniscectomy this was from another horse accident and she had just had the right knee done earlier in the month 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   History of nephrolithiasis    Hx of migraines    Hypothyroidism    Post-operative nausea and vomiting    Pulmonary nodule 2011   Rosacea     Family History  Problem Relation Age of Onset   Colon polyps Father    Colitis Father    Heart disease Father    Hypertension Father     Hyperlipidemia Father    Breast cancer Maternal Grandmother    Cancer Maternal Grandmother 18       Breast,uterine,ovarian   Colon polyps Mother    Diabetes Mother    Rheum arthritis Mother    Lupus Mother    Diabetes Paternal Grandfather    Hypertension Sister    Hyperlipidemia Sister     Social History   Socioeconomic History   Marital status: Married    Spouse name: Alinda Money   Number of children: Not on file   Years of education: Not on file   Highest education level: Not on file  Occupational History   Not on file  Tobacco Use   Smoking status: Never    Passive exposure: Past   Smokeless tobacco: Never  Vaping Use   Vaping status: Never Used  Substance and Sexual Activity   Alcohol use: No   Drug use: No   Sexual activity: Not on file  Other Topics Concern   Not on file  Social History Narrative   Not on file   Social Drivers of Health   Financial Resource Strain: Not on file  Food Insecurity: Not on file  Transportation Needs: Not on file  Physical Activity: Not on file  Stress: Not on file  Social Connections:  Not on file  Intimate Partner Violence: Not on file    Past Surgical History:  Procedure Laterality Date   ELBOW SURGERY Right    2012/ tendon and reattachment left arm in 2018   EYE SURGERY Right    PVD   FOOT SURGERY Left 1991   bunionectomy   KNEE ARTHROSCOPY WITH LATERAL MENISECTOMY Left 09/11/2023   Procedure: KNEE ARTHROSCOPY WITH LATERAL MENISECTOMY;  Surgeon: Bjorn Pippin, MD;  Location: [facility];  Service: Orthopedics;  Laterality: Left;   KNEE RECONSTRUCTION Left 09/11/2023   Procedure: KNEE RECONSTRUCTION MEDIAL COLLATERAL LIGAMENT WITH ACHILLES ALLOGRAFT;  Surgeon: Bjorn Pippin, MD;  Location: [facility];  Service: Orthopedics;  Laterality: Left;   MEDIAL COLLATERAL LIGAMENT REPAIR, KNEE  09/11/2023   Procedure: MEDIAL CAPSULE REPAIR;  Surgeon: Bjorn Pippin, MD;  Location: [facility];   Service: Orthopedics;;   SHOULDER SURGERY Bilateral    right-2003/left 2005 due to bone spurs      Current Outpatient Medications:    acetaminophen (TYLENOL) 650 MG CR tablet, Take 650 mg by mouth every 8 (eight) hours as needed for pain., Disp: , Rfl:    Adalimumab (HUMIRA PEN) 40 MG/0.4ML PNKT, 0.4 ml, Disp: , Rfl:    albuterol (VENTOLIN HFA) 108 (90 Base) MCG/ACT inhaler, INHALE 2 PUFFS BY MOUTH EVERY 15 MINUTES APART EVERY 4 HOURS TO RESCUE ASTHMA, Disp: 8.5 each, Rfl: 11   Amphetamine ER 9.4 MG TBED, Take 1 tablet by mouth daily., Disp: , Rfl:    aspirin EC 81 MG tablet, Take 1 tablet (81 mg total) by mouth daily. Swallow whole., Disp: 150 tablet, Rfl: 2   B Complex Vitamins (B COMPLEX-B12) TABS, Take 1 tablet by mouth daily., Disp: , Rfl:    Calcium 600-200 MG-UNIT per tablet, Take 1 tablet by mouth every other day. Every three days, Disp: , Rfl:    cetirizine (ZYRTEC) 10 MG tablet, Take 10 mg by mouth daily., Disp: , Rfl:    Cholecalciferol (VITAMIN D) 125 MCG (5000 UT) CAPS, Take 1 capsule by mouth daily., Disp: , Rfl:    Coenzyme Q10 (COQ10) 100 MG CAPS, Take 200 mg by mouth daily., Disp: , Rfl:    DULoxetine (CYMBALTA) 30 MG capsule, Take 1 capsule (30 mg total) by mouth daily. Patient needs appt for additional refills. Call office to schedule. (Patient taking differently: Take 60 mg by mouth every other day. Patient needs appt for additional refills. Call office to schedule.), Disp: 30 capsule, Rfl: 0   estradiol (VIVELLE-DOT) 0.05 MG/24HR patch, Place 1 patch onto the skin 2 (two) times a week., Disp: , Rfl:    IMITREX 100 MG tablet, TAKE 1 TABLET IMMEDIATELY AT ONSET OF MIGRAINE & MAY REPEAT ONCE IN 2 HOURS (MAX 2 TABS /24 HOURS)., Disp: 9 tablet, Rfl: 3   L-FORMULA LYSINE HCL PO, Take by mouth., Disp: , Rfl:    leflunomide (ARAVA) 20 MG tablet, Take 20 mg by mouth daily., Disp: , Rfl:    MAGNESIUM GLYCINATE PLUS PO, Take 400 mg by mouth 2 (two) times daily., Disp: , Rfl:     montelukast (SINGULAIR) 10 MG tablet, TAKE 1 TABLET BY MOUTH  DAILY FOR ALLERGIES, Disp: 90 tablet, Rfl: 3   Multiple Vitamin (MULTIVITAMIN) capsule, Take 1 capsule by mouth daily.  , Disp: , Rfl:    NON FORMULARY, Metro lotion prn for rosecea, Disp: , Rfl:    NP THYROID 15 MG tablet, Take 15 mg by  mouth daily., Disp: , Rfl:    NP THYROID 30 MG tablet, Take 30 mg by mouth daily., Disp: , Rfl:    Omega-3 Fatty Acids (FISH OIL) 1000 MG CAPS, Take 1,000 mg by mouth daily., Disp: , Rfl:    OVER THE COUNTER MEDICATION, Take 1 tablet by mouth daily., Disp: , Rfl:    progesterone (PROMETRIUM) 100 MG capsule, Take 100 mg by mouth at bedtime., Disp: , Rfl:    TRELEGY ELLIPTA 100-62.5-25 MCG/ACT AEPB, TAKE 1 PUFF BY MOUTH EVERY DAY, Disp: 60 each, Rfl: 11   valACYclovir (VALTREX) 500 MG tablet, Take 500 mg by mouth daily as needed (for outbreak). , Disp: , Rfl:    zinc gluconate 50 MG tablet, Take 50 mg by mouth daily., Disp: , Rfl:    pantoprazole (PROTONIX) 20 MG tablet, Take 1 tablet (20 mg total) by mouth daily. Take 30 minutes before meals on an empty stomach. This should be used while you take aspirin post operatively. Do not take with thyroid medication or milk products, Disp: 30 tablet, Rfl: 0    Physical Exam: Blood pressure 120/74, pulse 84, height 5' 8.5" (1.74 m), weight 161 lb (73 kg), SpO2 96%.    Affect appropriate Healthy:  appears stated age HEENT: normal Neck supple with no adenopathy JVP normal no bruits no thyromegaly Lungs clear with no wheezing and good diaphragmatic motion Heart:  S1/S2 no murmur, no rub, gallop or click PMI normal Abdomen: benighn, BS positve, no tenderness, no AAA no bruit.  No HSM or HJR Distal pulses intact with no bruits No edema Neuro non-focal Prior left knee scope    Labs:   Lab Results  Component Value Date   WBC 6.7 08/11/2023   HGB 12.2 08/11/2023   HCT 36.9 08/11/2023   MCV 100.8 (H) 08/11/2023   PLT 242 08/11/2023   No results for  input(s): "NA", "K", "CL", "CO2", "BUN", "CREATININE", "CALCIUM", "PROT", "BILITOT", "ALKPHOS", "ALT", "AST", "GLUCOSE" in the last 168 hours.  Invalid input(s): "LABALBU" No results found for: "CKTOTAL", "CKMB", "CKMBINDEX", "TROPONINI"  Lab Results  Component Value Date   CHOL 174 08/11/2023   CHOL 214 (H) 08/06/2022   CHOL 204 (H) 07/05/2021   Lab Results  Component Value Date   HDL 72 08/11/2023   HDL 88 08/06/2022   HDL 80 07/05/2021   Lab Results  Component Value Date   LDLCALC 82 08/11/2023   LDLCALC 106 (H) 08/06/2022   LDLCALC 105 (H) 07/05/2021   Lab Results  Component Value Date   TRIG 103 08/11/2023   TRIG 100 08/06/2022   TRIG 98 07/05/2021   Lab Results  Component Value Date   CHOLHDL 2.4 08/11/2023   CHOLHDL 2.4 08/06/2022   CHOLHDL 2.6 07/05/2021   No results found for: "LDLDIRECT"    Radiology: No results found.  EKG: SR rate 59 ICRBBB 08/06/22    ASSESSMENT AND PLAN:   Aortic Ectasia:  minimal with root only 4.0 cm TTE with tri leaflet AV and root 3.6 ascending aorta 3.3 cm consider gated chest CTA in a year  HTN:  in normal range despite amphetamine use no need for beta blocker RA:  continue humira and Arava  Thyroid:  on replacement TSH normal  COPD continue Trelegy and inhalers CT chest no ILD ADD:  discussed limited use of amphetamine only uses M-Th when she works  Ortho:  f/u [facility] post left scope with meniscus and MCL/ACL repair    F/U in a year  SignedCharlton Haws 01/02/2024, 4:03 PM

## 2024-01-02 ENCOUNTER — Ambulatory Visit: Payer: 59 | Attending: Cardiovascular Disease | Admitting: Cardiovascular Disease

## 2024-01-02 ENCOUNTER — Encounter: Payer: Self-pay | Admitting: Cardiovascular Disease

## 2024-01-02 VITALS — BP 120/74 | HR 84 | Ht 68.5 in | Wt 161.0 lb

## 2024-01-02 DIAGNOSIS — I77810 Thoracic aortic ectasia: Secondary | ICD-10-CM

## 2024-01-02 DIAGNOSIS — I10 Essential (primary) hypertension: Secondary | ICD-10-CM

## 2024-01-02 NOTE — Patient Instructions (Signed)

## 2024-01-20 ENCOUNTER — Encounter: Payer: Self-pay | Admitting: Obstetrics and Gynecology

## 2024-01-29 ENCOUNTER — Ambulatory Visit
Admission: RE | Admit: 2024-01-29 | Discharge: 2024-01-29 | Disposition: A | Discharge status: Home / Self Care | Payer: 59 | Source: Ambulatory Visit | Attending: Obstetrics and Gynecology | Admitting: Obstetrics and Gynecology

## 2024-01-29 DIAGNOSIS — Z803 Family history of malignant neoplasm of breast: Secondary | ICD-10-CM

## 2024-01-29 MED ORDER — GADOPICLENOL 0.5 MMOL/ML IV SOLN
7.0000 mL | Freq: Once | INTRAVENOUS | Status: AC | PRN
  Administered 2024-01-29: 7 mL via INTRAVENOUS

## 2024-02-16 ENCOUNTER — Ambulatory Visit: Payer: 59 | Admitting: Adult Health

## 2024-03-11 ENCOUNTER — Encounter: Payer: Self-pay | Admitting: Internal Medicine

## 2024-03-11 DIAGNOSIS — G4720 Circadian rhythm sleep disorder, unspecified type: Secondary | ICD-10-CM | POA: Insufficient documentation

## 2024-03-11 DIAGNOSIS — E039 Hypothyroidism, unspecified: Secondary | ICD-10-CM | POA: Insufficient documentation

## 2024-03-12 ENCOUNTER — Encounter: Payer: Self-pay | Admitting: General Practice

## 2024-03-12 ENCOUNTER — Ambulatory Visit: Admitting: General Practice

## 2024-03-12 VITALS — BP 122/70 | HR 82 | Temp 98.3°F | Ht 68.5 in | Wt 162.0 lb

## 2024-03-12 DIAGNOSIS — J4530 Mild persistent asthma, uncomplicated: Secondary | ICD-10-CM | POA: Diagnosis not present

## 2024-03-12 DIAGNOSIS — F419 Anxiety disorder, unspecified: Secondary | ICD-10-CM | POA: Diagnosis not present

## 2024-03-12 DIAGNOSIS — I77810 Thoracic aortic ectasia: Secondary | ICD-10-CM

## 2024-03-12 DIAGNOSIS — J309 Allergic rhinitis, unspecified: Secondary | ICD-10-CM

## 2024-03-12 DIAGNOSIS — Z23 Encounter for immunization: Secondary | ICD-10-CM | POA: Diagnosis not present

## 2024-03-12 DIAGNOSIS — D124 Benign neoplasm of descending colon: Secondary | ICD-10-CM

## 2024-03-12 DIAGNOSIS — S069X0S Unspecified intracranial injury without loss of consciousness, sequela: Secondary | ICD-10-CM

## 2024-03-12 DIAGNOSIS — Z7689 Persons encountering health services in other specified circumstances: Secondary | ICD-10-CM | POA: Insufficient documentation

## 2024-03-12 DIAGNOSIS — M5412 Radiculopathy, cervical region: Secondary | ICD-10-CM

## 2024-03-12 DIAGNOSIS — G43009 Migraine without aura, not intractable, without status migrainosus: Secondary | ICD-10-CM | POA: Diagnosis not present

## 2024-03-12 DIAGNOSIS — R918 Other nonspecific abnormal finding of lung field: Secondary | ICD-10-CM

## 2024-03-12 DIAGNOSIS — J45991 Cough variant asthma: Secondary | ICD-10-CM

## 2024-03-12 DIAGNOSIS — E039 Hypothyroidism, unspecified: Secondary | ICD-10-CM

## 2024-03-12 MED ORDER — IMITREX 100 MG PO TABS: ORAL_TABLET | ORAL | 1 refills | Status: AC

## 2024-03-12 NOTE — Assessment & Plan Note (Signed)
 Followed by cardiology.  Reviewed Dr. Francie Irani notes from feb, 2025.

## 2024-03-12 NOTE — Assessment & Plan Note (Signed)
 Controlled.   Continue Imitrex  as needed. Refill provided.

## 2024-03-12 NOTE — Patient Instructions (Addendum)
 Follow up in September, 2024.   Refill sent for Imitrex  to your pharmacy.   It was a pleasure to meet you today! Please don't hesitate to contact me with any questions. Welcome to [facility]!

## 2024-03-12 NOTE — Assessment & Plan Note (Signed)
 Followed by pulmonology.  No concerns today.

## 2024-03-12 NOTE — Assessment & Plan Note (Signed)
 Controlled.  Continue Trelegy daily and albuterol  as needed.

## 2024-03-12 NOTE — Assessment & Plan Note (Addendum)
 Controlled.  Continue Cymbalta  60 mg once daily. Followed by ortho.

## 2024-03-12 NOTE — Assessment & Plan Note (Signed)
 EMR reviewed briefly.

## 2024-03-12 NOTE — Assessment & Plan Note (Signed)
 Controlled.  Continue Cymbalta  60 mg once daily.

## 2024-03-12 NOTE — Assessment & Plan Note (Signed)
 Controlled.  Reviewed TSH levels from 07/2023.  Followed by endocrinology.  Continue NP Thyroid 45 mg once daily.

## 2024-03-12 NOTE — Assessment & Plan Note (Signed)
 Controlled.  Continue zyrtec once daily.

## 2024-03-12 NOTE — Assessment & Plan Note (Signed)
 Reviewed colonoscopy report from 2018.  Next one due for 2028 with Dr. Elvin Hammer.

## 2024-03-12 NOTE — Assessment & Plan Note (Signed)
 Controlled.   Continue Cymbalta  60 mg once daily.

## 2024-03-12 NOTE — Assessment & Plan Note (Signed)
 Controlled.   Followed by pulmonology.  Reviewed notes from 08/14/23.  Continue Trelegy 1 puff daily; albuterol  as needed.  Zyrtec 10 mg daily.  Saline nasal rinses as needed.

## 2024-03-12 NOTE — Progress Notes (Signed)
 New Patient Office Visit  Subjective    Patient ID: Nicole Waller, female    DOB: 07-13-1966  Age: 58 y.o. MRN: [id number]  CC:  Chief Complaint  Patient presents with   New Patient (Initial Visit)    HPI Nicole Waller is a 58 y.o. female presents to establish care.   Previous pcp/physical/labs: [facility]   Migraine: diagnosed several years ago. She has seen the neurologist in the past. She is currently managed on Imitrex  as needed. She gets a migraine about 1-2 times a month. She does need a refill.   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 Her last appointment was earlier this year. No concerns today. She follows with them yearly. She had a recent breast MRI which showed that ascending thoracic aorta measuring 4 cm which was unchanged from before. She is being followed closely by cardiology. She denies any chest pain, shortness of breath or difficulty breathing.   RA: followed by rheumatology. Last visit was on 01/19/24. She is currently on Humira biweekly. No concerns today.   Pulmonology nodule/asthma: followed by pulmonology. Last visit was 08/14/23. Last chest CT was on 02/24/2022. No concerns. No shortness of breath or difficulty breathing. No history of smoking. Exposure to secondhand smoke as a child. She has a history of asthma. Currently on trelegy daily and albuterol  as needed.   Hypothyroidism: currently managed on NP thyroid 45 mg once daily. She is followed by endocrinology, Nicole Waller. No concerns today.   TBI/Anxiety and depression/ADD: in 2021 with a horse accident. Major concussion with stroke like symptoms 4-6 weeks after accident. She was diagnosed with ADD/adhd, anxiety and depression. She is currently managed on amphetamine er 9.4 mg once daily. She does report that she does have some memory loss still and Currently managed on Cymbalta  60 MG once daily. She is followed by ortho at [facility].     Outpatient Encounter Medications as of 03/12/2024   Medication Sig   Adalimumab (HUMIRA PEN) 40 MG/0.4ML PNKT 0.4 ml   albuterol  (VENTOLIN  HFA) 108 (90 Base) MCG/ACT inhaler INHALE 2 PUFFS BY MOUTH EVERY 15 MINUTES APART EVERY 4 HOURS TO RESCUE ASTHMA   Amphetamine ER 9.4 MG TBED Take 1 tablet by mouth daily.   aspirin  EC 81 MG tablet Take 1 tablet (81 mg total) by mouth daily. Swallow whole. (Patient taking differently: Take 81 mg by mouth 3 (three) times a week. Swallow whole.)   B Complex Vitamins (B COMPLEX-B12) TABS Take 1 tablet by mouth daily.   Calcium 600-200 MG-UNIT per tablet Take 1 tablet by mouth every other day. Every three days   cetirizine (ZYRTEC) 10 MG tablet Take 10 mg by mouth daily.   Cholecalciferol (VITAMIN D ) 125 MCG (5000 UT) CAPS Take 1 capsule by mouth daily.   Coenzyme Q10 (COQ10) 100 MG CAPS Take 200 mg by mouth daily.   DULoxetine  (CYMBALTA ) 30 MG capsule Take 1 capsule (30 mg total) by mouth daily. Patient needs appt for additional refills. Call office to schedule. (Patient taking differently: Take 60 mg by mouth 3 (three) times a week.)   estradiol (VIVELLE-DOT) 0.05 MG/24HR patch Place 1 patch onto the skin 2 (two) times a week.   L-FORMULA LYSINE HCL PO Take by mouth.   leflunomide (ARAVA) 20 MG tablet Take 20 mg by mouth daily.   MAGNESIUM GLYCINATE PLUS PO Take 400 mg by mouth 2 (two) times daily.   montelukast  (SINGULAIR ) 10 MG tablet TAKE 1 TABLET BY MOUTH  DAILY  FOR ALLERGIES   Multiple Vitamin (MULTIVITAMIN) capsule Take 1 capsule by mouth daily.     NON FORMULARY Metro lotion prn for rosecea   NP THYROID 15 MG tablet Take 15 mg by mouth daily.   NP THYROID 30 MG tablet Take 30 mg by mouth daily.   Omega-3 Fatty Acids (FISH OIL) 1000 MG CAPS Take 1,000 mg by mouth daily.   OVER THE COUNTER MEDICATION Take 1 tablet by mouth daily.   progesterone (PROMETRIUM) 100 MG capsule Take 100 mg by mouth at bedtime.   TRELEGY ELLIPTA  100-62.5-25 MCG/ACT AEPB TAKE 1 PUFF BY MOUTH EVERY DAY   valACYclovir (VALTREX)  500 MG tablet Take 500 mg by mouth daily as needed (for outbreak).    zinc  gluconate 50 MG tablet Take 50 mg by mouth daily.   [DISCONTINUED] IMITREX  100 MG tablet TAKE 1 TABLET IMMEDIATELY AT ONSET OF MIGRAINE & MAY REPEAT ONCE IN 2 HOURS (MAX 2 TABS /24 HOURS).   IMITREX  100 MG tablet TAKE 1 TABLET IMMEDIATELY AT ONSET OF MIGRAINE & MAY REPEAT ONCE IN 2 HOURS (MAX 2 TABS /24 HOURS).   [DISCONTINUED] acetaminophen  (TYLENOL ) 650 MG CR tablet Take 650 mg by mouth every 8 (eight) hours as needed for pain.   [DISCONTINUED] pantoprazole  (PROTONIX ) 20 MG tablet Take 1 tablet (20 mg total) by mouth daily. Take 30 minutes before meals on an empty stomach. This should be used while you take aspirin  post operatively. Do not take with thyroid medication or milk products   No facility-administered encounter medications on file as of 03/12/2024.    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   History of nephrolithiasis    Hx of migraines    Hypothyroidism    Post-operative nausea and vomiting    Pulmonary nodule 2011   Rosacea    Scapular dyskinesis 07/07/2019    Past Surgical History:  Procedure Laterality Date   CATARACT EXTRACTION Right 10/23/2023   CATARACT EXTRACTION Left 10/30/2023   ELBOW SURGERY Right    2012/ tendon and reattachment left arm in 2018   EYE SURGERY Right    PVD   FOOT SURGERY Left 1991   bunionectomy   KNEE ARTHROSCOPY WITH LATERAL MENISECTOMY Left 09/11/2023   Procedure: KNEE ARTHROSCOPY WITH LATERAL MENISECTOMY;   Surgeon: Micheline Ahr, MD;  Location: [facility];  Service: Orthopedics;  Laterality: Left;   KNEE RECONSTRUCTION Left 09/11/2023   Procedure: KNEE RECONSTRUCTION MEDIAL COLLATERAL LIGAMENT WITH ACHILLES ALLOGRAFT;  Surgeon: Micheline Ahr, MD;  Location: [facility];  Service: Orthopedics;  Laterality: Left;   MEDIAL COLLATERAL LIGAMENT REPAIR, KNEE  09/11/2023   Procedure: MEDIAL CAPSULE REPAIR;  Surgeon: Micheline Ahr, MD;  Location: [facility];  Service: Orthopedics;;   SHOULDER SURGERY Bilateral    right-2003/left 2005 due to bone spurs    Family History  Problem Relation Age  of Onset   Colon polyps Father    Colitis Father    Heart disease Father    Hypertension Father    Hyperlipidemia Father    Breast cancer Maternal Grandmother    Cancer Maternal Grandmother 47       Breast,uterine,ovarian   Colon polyps Mother    Diabetes Mother    Rheum arthritis Mother    Lupus Mother    Diabetes Paternal Grandfather    Hypertension Sister    Hyperlipidemia Sister     Social History   Socioeconomic History   Marital status: Married    Spouse name: Baker Bon   Number of children: Not on file   Years of education: Not on file   Highest education level: Not on file  Occupational History   Not on file  Tobacco Use   Smoking status: Never    Passive exposure: Past   Smokeless tobacco: Never  Vaping Use   Vaping status: Never Used  Substance and Sexual Activity   Alcohol use: No   Drug use: No   Sexual activity: Not on file  Other Topics Concern   Not on file  Social History Narrative   Not on file   Social Drivers of Health   Financial Resource Strain: Not on file  Food Insecurity: Not on file  Transportation Needs: Not on file  Physical Activity: Not on file  Stress: Not on file  Social Connections: Not on file  Intimate Partner Violence: Not on file    Review of Systems  Constitutional:  Negative for chills and fever.   Respiratory:  Negative for shortness of breath.   Cardiovascular:  Negative for chest pain.  Gastrointestinal:  Negative for abdominal pain, constipation, diarrhea, heartburn, nausea and vomiting.  Genitourinary:  Negative for dysuria, frequency and urgency.  Neurological:  Negative for dizziness and headaches.  Endo/Heme/Allergies:  Negative for polydipsia.  Psychiatric/Behavioral:  Negative for depression and suicidal ideas. The patient is not nervous/anxious.         Objective    BP 122/70 (BP Location: Left Arm, Patient Position: Sitting, Cuff Size: Normal)   Pulse 82   Temp 98.3 F (36.8 C) (Oral)   Ht 5' 8.5" (1.74 m)   Wt 162 lb (73.5 kg)   SpO2 98%   BMI 24.27 kg/m   Physical Exam Vitals and nursing note reviewed.  Constitutional:      Appearance: Normal appearance.  Cardiovascular:     Rate and Rhythm: Normal rate and regular rhythm.     Pulses: Normal pulses.     Heart sounds: Normal heart sounds.  Pulmonary:     Effort: Pulmonary effort is normal.     Breath sounds: Normal breath sounds.  Neurological:     Mental Status: She is alert and oriented to person, place, and time.  Psychiatric:        Mood and Affect: Mood normal.        Behavior: Behavior normal.        Thought Content: Thought content normal.        Judgment: Judgment normal.         Assessment & Plan:  Establishing care with new doctor, encounter for Assessment & Plan: EMR reviewed briefly.    Migraine without aura and without status migrainosus, not intractable Assessment & Plan: Controlled.   Continue Imitrex  as needed. Refill provided.  Orders: -     Imitrex ; TAKE 1 TABLET IMMEDIATELY AT ONSET OF MIGRAINE & MAY REPEAT ONCE IN  2 HOURS (MAX 2 TABS /24 HOURS).  Dispense: 9 tablet; Refill: 1  Need for pneumococcal 20-valent conjugate vaccination -     Pneumococcal conjugate vaccine 20-valent  Mild persistent extrinsic asthma without complication Assessment &  Plan: Controlled.   Followed by pulmonology.  Reviewed notes from 08/14/23.  Continue Trelegy 1 puff daily; albuterol  as needed.  Zyrtec 10 mg daily.  Saline nasal rinses as needed.    Allergic rhinitis, unspecified seasonality, unspecified trigger Assessment & Plan: Controlled.  Continue zyrtec once daily.   Anxiety Assessment & Plan: Controlled.   Continue Cymbalta  60 mg once daily.   Benign neoplasm of descending colon Assessment & Plan: Reviewed colonoscopy report from 2018.  Next one due for 2028 with Dr. Elvin Hammer.   Cough variant asthma Assessment & Plan: Controlled.  Continue Trelegy daily and albuterol  as needed.   Ectatic thoracic aorta [facility]) Assessment & Plan: Followed by cardiology.  Reviewed Dr. Francie Irani notes from feb, 2025.   Traumatic brain injury, without loss of consciousness, sequela (HCC) Assessment & Plan: Controlled.  Continue Cymbalta  60 mg once daily. Followed by ortho.   Hypothyroidism, unspecified type Assessment & Plan: Controlled.  Reviewed TSH levels from 07/2023.  Followed by endocrinology.  Continue NP Thyroid 45 mg once daily.   Pulmonary nodules Assessment & Plan: Followed by pulmonology.  No concerns today.   Radiculitis of left cervical region Assessment & Plan: Controlled.  Continue Cymbalta  60 mg once daily.      Return in about 5 months (around 08/10/2024) for physical.   Jolanda Nation, NP

## 2024-03-13 ENCOUNTER — Encounter: Payer: Self-pay | Admitting: General Practice

## 2024-04-12 ENCOUNTER — Ambulatory Visit: Admitting: Adult Health

## 2024-04-12 ENCOUNTER — Encounter: Payer: Self-pay | Admitting: Adult Health

## 2024-04-12 VITALS — BP 132/84 | HR 72 | Ht 68.5 in | Wt 162.4 lb

## 2024-04-12 DIAGNOSIS — J309 Allergic rhinitis, unspecified: Secondary | ICD-10-CM

## 2024-04-12 DIAGNOSIS — J4530 Mild persistent asthma, uncomplicated: Secondary | ICD-10-CM | POA: Diagnosis not present

## 2024-04-12 NOTE — Progress Notes (Signed)
 @Patient  ID: Nicole Waller, female    DOB: May 22, 1966, 58 y.o.   MRN: [id number]  Chief Complaint  Patient presents with   Follow-up    Referring provider: Vangie Genet, MD  HPI: 58 year old female never smoker seen for pulmonary consult March 2023 for asthma.  Snoring evaluation 2023 negative for sleep apnea Medical history significant for rheumatoid arthritis, traumatic brain injury in 2014. 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  TEST/EVENTS :  PFTs done 02/26/22 shows normal lung function with no airflow obstruction or restriction.  FEV1 100%.,  Ratio 88, FVC 89%.,  DLCO 93%. (Took Trelegy prior to test)    02/22/22 High-resolution CT chest that showed no evidence of interstitial lung disease.  No acute process.  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 home sleep study that was completed on April 22, 2022 that showed no significant sleep apnea with AHI at 3.9/hour and SPO2 low at 87%   04/12/2024 Follow up ; Asthma and AR  Patient presents for a follow-up visit for asthma.  Last seen October 2024.  She says overall her breathing is doing well.  She denies any flare of cough or wheezing.   She works full-time.  She remains on Trelegy inhaler daily.  No increased albuterol  use.Takes Zyrtec as needed.  Had right knee surgery last fall, did well. Shortly after surgery , had left knee injury -horse kicked her with severe injury required surgery. Still doing PT. Is doing better back to exercising. Has taken long time to recover.   Had cataract surgery- did well.       Allergies  Allergen Reactions   Accolate  [Zafirlukast ]     GI Upset   Codeine     REACTION: nausea and vomiting   Lyrica [Pregabalin]     Fatigue   Oxycodone     nauseated   Probiotic [Bacid]     Headache   Tape     Causes redness, and skin gets raw!   Methotrexate Other  (See Comments)    Immunization History  Administered Date(s) Administered   Influenza Split 08/11/2014, 08/19/2017   Influenza Whole 08/11/2012   Influenza, Mdck, Trivalent,PF 6+ MOS(egg free) 09/28/2023   Influenza,inj,Quad PF,6+ Mos 08/19/2017, 08/11/2021   PFIZER(Purple Top)SARS-COV-2 Vaccination 01/18/2020, 02/08/2020, 09/05/2020   PNEUMOCOCCAL CONJUGATE-20 03/12/2024   PPD Test 04/16/2016, 05/12/2017, 05/26/2018, 06/23/2019, 06/26/2020, 07/05/2021, 08/06/2022, 08/11/2023   Pfizer Covid-19 Vaccine Bivalent Booster 65yrs & up 12/05/2021   Pfizer(Comirnaty)Fall Seasonal Vaccine 12 years and older 08/19/2022   Pneumococcal Conjugate-13 10/09/2017   Pneumococcal Polysaccharide-23 12/04/2017   Pneumococcal-Unspecified 11/11/2006   Td 11/11/2006   Tdap 05/12/2017    Past Medical History:  Diagnosis Date   Abdominal pain    in past   Abnormal chest CT    in past   Abnormal EKG    ABNORMAL EKG 05/10/2009   Anxiety    since head injury   Chronic cough    Chronic headache    CKD Stage 2 (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   History of nephrolithiasis    Hx of migraines  Hypothyroidism    Post-operative nausea and vomiting    Pulmonary nodule 2011   Rosacea    Scapular dyskinesis 07/07/2019    Tobacco History: Social History   Tobacco Use  Smoking Status Never   Passive exposure: Past  Smokeless Tobacco Never   Counseling given: No   Outpatient Medications Prior to Visit  Medication Sig Dispense Refill   Adalimumab (HUMIRA PEN) 40 MG/0.4ML PNKT 0.4 ml     albuterol  (VENTOLIN  HFA) 108 (90 Base) MCG/ACT inhaler INHALE 2 PUFFS BY MOUTH EVERY 15 MINUTES APART EVERY 4 HOURS TO RESCUE ASTHMA 8.5 each 11   Amphetamine ER 9.4 MG TBED Take 1  tablet by mouth daily.     aspirin  EC 81 MG tablet Take 1 tablet (81 mg total) by mouth daily. Swallow whole. (Patient taking differently: Take 81 mg by mouth 3 (three) times a week. Swallow whole.) 150 tablet 2   B Complex Vitamins (B COMPLEX-B12) TABS Take 1 tablet by mouth daily.     Calcium 600-200 MG-UNIT per tablet Take 1 tablet by mouth every other day. Every three days     cetirizine (ZYRTEC) 10 MG tablet Take 10 mg by mouth daily.     Cholecalciferol (VITAMIN D ) 125 MCG (5000 UT) CAPS Take 1 capsule by mouth daily.     Coenzyme Q10 (COQ10) 100 MG CAPS Take 200 mg by mouth daily.     DULoxetine  (CYMBALTA ) 30 MG capsule Take 1 capsule (30 mg total) by mouth daily. Patient needs appt for additional refills. Call office to schedule. (Patient taking differently: Take 60 mg by mouth 3 (three) times a week.) 30 capsule 0   estradiol (VIVELLE-DOT) 0.05 MG/24HR patch Place 1 patch onto the skin 2 (two) times a week.     IMITREX  100 MG tablet TAKE 1 TABLET IMMEDIATELY AT ONSET OF MIGRAINE & MAY REPEAT ONCE IN 2 HOURS (MAX 2 TABS /24 HOURS). 9 tablet 1   L-FORMULA LYSINE HCL PO Take by mouth.     leflunomide (ARAVA) 20 MG tablet Take 20 mg by mouth daily.     MAGNESIUM GLYCINATE PLUS PO Take 400 mg by mouth 2 (two) times daily.     montelukast  (SINGULAIR ) 10 MG tablet TAKE 1 TABLET BY MOUTH  DAILY FOR ALLERGIES 90 tablet 3   Multiple Vitamin (MULTIVITAMIN) capsule Take 1 capsule by mouth daily.       NON FORMULARY Metro lotion prn for rosecea     NP THYROID 15 MG tablet Take 15 mg by mouth daily.     NP THYROID 30 MG tablet Take 30 mg by mouth daily.     Omega-3 Fatty Acids (FISH OIL) 1000 MG CAPS Take 1,000 mg by mouth daily.     OVER THE COUNTER MEDICATION Take 1 tablet by mouth daily.     progesterone (PROMETRIUM) 100 MG capsule Take 100 mg by mouth at bedtime.     TRELEGY ELLIPTA  100-62.5-25 MCG/ACT AEPB TAKE 1 PUFF BY MOUTH EVERY DAY 60 each 11   valACYclovir (VALTREX) 500 MG tablet Take 500  mg by mouth daily as needed (for outbreak).      zinc  gluconate 50 MG tablet Take 50 mg by mouth daily.     No facility-administered medications prior to visit.     Review of Systems:   Constitutional:   No  weight loss, night sweats,  Fevers, chills, fatigue, or  lassitude.  HEENT:   No headaches,  Difficulty swallowing,  Tooth/dental problems, or  Sore  throat,                No sneezing, itching, ear ache, +nasal congestion, post nasal drip,   CV:  No chest pain,  Orthopnea, PND, swelling in lower extremities, anasarca, dizziness, palpitations, syncope.   GI  No heartburn, indigestion, abdominal pain, nausea, vomiting, diarrhea, change in bowel habits, loss of appetite, bloody stools.   Resp: No shortness of breath with exertion or at rest.  No excess mucus, no productive cough,  No non-productive cough,  No coughing up of blood.  No change in color of mucus.  No wheezing.  No chest wall deformity  Skin: no rash or lesions.  GU: no dysuria, change in color of urine, no urgency or frequency.  No flank pain, no hematuria   MS:  No joint pain or swelling.  No decreased range of motion.  No back pain.    Physical Exam  BP 132/84 (BP Location: Left Arm, Patient Position: Sitting, Cuff Size: Normal)   Pulse 72   Ht 5' 8.5" (1.74 m)   Wt 162 lb 6.4 oz (73.7 kg)   SpO2 100%   BMI 24.33 kg/m   GEN: A/Ox3; pleasant , NAD, well nourished    HEENT:  [place]/AT,   NOSE-clear, THROAT-clear, no lesions, no postnasal drip or exudate noted.   NECK:  Supple w/ fair ROM; no JVD; normal carotid impulses w/o bruits; no thyromegaly or nodules palpated; no lymphadenopathy.    RESP  Clear  P & A; w/o, wheezes/ rales/ or rhonchi. no accessory muscle use, no dullness to percussion  CARD:  RRR, no m/r/g, no peripheral edema, pulses intact, no cyanosis or clubbing.  GI:   Soft & nt; nml bowel sounds; no organomegaly or masses detected.   Musco: Warm bil, no deformities or joint swelling noted.    Neuro: alert, no focal deficits noted.    Skin: Warm, no lesions or rashes    Lab Results:  CBC    Component Value Date/Time   WBC 6.7 08/11/2023 1511   RBC 3.66 (L) 08/11/2023 1511   HGB 12.2 08/11/2023 1511   HCT 36.9 08/11/2023 1511   PLT 242 08/11/2023 1511   MCV 100.8 (H) 08/11/2023 1511   MCH 33.3 (H) 08/11/2023 1511   MCHC 33.1 08/11/2023 1511   RDW 12.3 08/11/2023 1511   LYMPHSABS 2,111 08/11/2023 1511   MONOABS 0.4 07/07/2019 1318   EOSABS 188 08/11/2023 1511   BASOSABS 80 08/11/2023 1511    BMET    Component Value Date/Time   NA 137 08/11/2023 1511   K 4.6 08/11/2023 1511   CL 105 08/11/2023 1511   CO2 26 08/11/2023 1511   GLUCOSE 91 08/11/2023 1511   BUN 24 08/11/2023 1511   CREATININE 0.84 08/11/2023 1511   CALCIUM 9.7 08/11/2023 1511   GFRNONAA 75 06/26/2020 0907   GFRAA 87 06/26/2020 0907    BNP No results found for: "BNP"  ProBNP No results found for: "PROBNP"  Imaging: No results found.  Administration History     None          Latest Ref Rng & Units 02/26/2022    1:37 PM  PFT Results  FVC-Pre L 3.61   FVC-Predicted Pre % 91   FVC-Post L 3.51   FVC-Predicted Post % 89   Pre FEV1/FVC % % 86   Post FEV1/FCV % % 88   FEV1-Pre L 3.09   FEV1-Predicted Pre % 100   FEV1-Post L 3.10  DLCO uncorrected ml/min/mmHg 22.10   DLCO UNC% % 93   DLCO corrected ml/min/mmHg 22.10   DLCO COR %Predicted % 93   DLVA Predicted % 107   TLC L 5.07   TLC % Predicted % 89   RV % Predicted % 64     No results found for: "NITRICOXIDE"      Assessment & Plan:   Allergic asthma Well-controlled on present regimen.  No changes  Plan  Patient Instructions  Continue on TRELEGY 1 puff daily, rinse after use.  Albuterol  inhaler As needed   Asthma action plan as discussed.   Zyrtec 10mg  daily  Saline nasal rinses As needed    Follow up in 1 year and As needed        Allergic rhinitis Continue on current regimen.     Roena Clark, NP 04/12/2024

## 2024-04-12 NOTE — Assessment & Plan Note (Signed)
 Well-controlled on present regimen.  No changes  Plan  Patient Instructions  Continue on TRELEGY 1 puff daily, rinse after use.  Albuterol  inhaler As needed   Asthma action plan as discussed.   Zyrtec 10mg  daily  Saline nasal rinses As needed    Follow up in 1 year and As needed

## 2024-04-12 NOTE — Patient Instructions (Signed)
 Continue on TRELEGY 1 puff daily, rinse after use.  Albuterol  inhaler As needed   Asthma action plan as discussed.   Zyrtec 10mg  daily  Saline nasal rinses As needed    Follow up in 1 year and As needed

## 2024-04-12 NOTE — Assessment & Plan Note (Signed)
 Continue on current regimen .

## 2024-05-03 ENCOUNTER — Telehealth: Payer: Self-pay

## 2024-05-03 MED ORDER — MONTELUKAST SODIUM 10 MG PO TABS: ORAL_TABLET | ORAL | 3 refills | Status: AC

## 2024-05-03 NOTE — Telephone Encounter (Signed)
 Received refill request for montelukast  tablets from [facility]. Erx has been sent.

## 2024-07-13 ENCOUNTER — Encounter: Payer: Self-pay | Admitting: Sports Medicine

## 2024-07-26 LAB — CBC: RBC: 3.7 — AB (ref 3.87–5.11)

## 2024-07-26 LAB — HEPATIC FUNCTION PANEL
ALT: 21 U/L (ref 7–35)
AST: 24 (ref 13–35)
Alkaline Phosphatase: 107 (ref 25–125)
Bilirubin, Total: 0.3

## 2024-07-26 LAB — CBC AND DIFFERENTIAL
HCT: 38 (ref 36–46)
Hemoglobin: 12.3 (ref 12.0–16.0)
Platelets: 237 K/uL (ref 150–400)
WBC: 8.2

## 2024-07-26 LAB — BASIC METABOLIC PANEL WITH GFR
BUN: 15 (ref 4–21)
CO2: 22 (ref 13–22)
Chloride: 103 (ref 99–108)
Creatinine: 0.8 (ref 0.5–1.1)
Glucose: 88
Potassium: 4.4 meq/L (ref 3.5–5.1)
Sodium: 139 (ref 137–147)

## 2024-07-26 LAB — COMPREHENSIVE METABOLIC PANEL WITH GFR
Albumin: 4.4 (ref 3.5–5.0)
Calcium: 10 (ref 8.7–10.7)
Globulin: 2.4
eGFR: 89

## 2024-07-26 LAB — HM HEPATITIS C SCREENING LAB: HM Hepatitis Screen: NEGATIVE

## 2024-07-28 LAB — TSH: TSH: 0.76 (ref 0.41–5.90)

## 2024-08-13 ENCOUNTER — Encounter: Payer: Self-pay | Admitting: General Practice

## 2024-08-13 ENCOUNTER — Ambulatory Visit: Admitting: General Practice

## 2024-08-13 VITALS — BP 120/82 | HR 76 | Temp 97.9°F | Ht 68.5 in | Wt 161.8 lb

## 2024-08-13 DIAGNOSIS — Z0000 Encounter for general adult medical examination without abnormal findings: Secondary | ICD-10-CM | POA: Insufficient documentation

## 2024-08-13 DIAGNOSIS — E039 Hypothyroidism, unspecified: Secondary | ICD-10-CM | POA: Diagnosis not present

## 2024-08-13 DIAGNOSIS — R5383 Other fatigue: Secondary | ICD-10-CM | POA: Insufficient documentation

## 2024-08-13 DIAGNOSIS — E559 Vitamin D deficiency, unspecified: Secondary | ICD-10-CM

## 2024-08-13 DIAGNOSIS — Z23 Encounter for immunization: Secondary | ICD-10-CM

## 2024-08-13 DIAGNOSIS — M0520 Rheumatoid vasculitis with rheumatoid arthritis of unspecified site: Secondary | ICD-10-CM | POA: Insufficient documentation

## 2024-08-13 DIAGNOSIS — Z114 Encounter for screening for human immunodeficiency virus [HIV]: Secondary | ICD-10-CM | POA: Diagnosis not present

## 2024-08-13 DIAGNOSIS — E063 Autoimmune thyroiditis: Secondary | ICD-10-CM | POA: Insufficient documentation

## 2024-08-13 DIAGNOSIS — D539 Nutritional anemia, unspecified: Secondary | ICD-10-CM | POA: Insufficient documentation

## 2024-08-13 NOTE — Assessment & Plan Note (Signed)
 Unclear etiology.  Reviewed notes from rheumatology and endocrinology.  Work up pending.  She will keep me updated.

## 2024-08-13 NOTE — Assessment & Plan Note (Signed)
 Controlled. Reviewed labs and notes from endocrinology.  Continue TP Thyroid 45 mg once daily.

## 2024-08-13 NOTE — Patient Instructions (Addendum)
 Schedule lab only appt. Come fasting for four hours prior to the appointment.   Keep me posted about the fatigue work up.   Schedule 2nd shingrix vaccine.   Schedule physical for next year.   It was a pleasure to see you today!

## 2024-08-13 NOTE — Progress Notes (Signed)
 Established Patient Office Visit  Subjective   Patient ID: Nicole Waller, female    DOB: 1966-02-28  Age: 58 y.o. MRN: [id number]  Chief Complaint  Patient presents with   Annual Exam    HPI  Nicole Waller is a 58 year old female with past medical history of ectatic thoracic aorta, migraines, pulmonary nodules, asthma, hypothyroidism, TBI, anxiety presents today for complete physical and follow up of chronic conditions.  Immunizations: -Tetanus: Completed in 2018 -Influenza: completed this season -Shingles: due -Pneumonia: Completed   Diet: Fair diet.  Exercise: regular exercise.  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   Macrocytic anemia 08/13/2024   Encounter for screening and preventative care 08/13/2024   Fatigue 08/13/2024   Need for pneumococcal 20-valent conjugate vaccination 03/12/2024   Establishing care with new doctor, encounter for 03/12/2024   Hypothyroidism 03/11/2024   Ectatic thoracic aorta 02/26/2022   Radiculitis of left cervical region 12/29/2019   Benign neoplasm of descending colon 07/08/2017   Allergic asthma 06/11/2017   Historical TBI with chronic depressive symptoms 04/03/2015   Vitamin D  deficiency 04/02/2015   Allergic rhinitis 04/02/2015   Anxiety    Migraine headache without aura 05/25/2013   Cough variant asthma 12/19/2010   Pulmonary nodules 06/27/2010   Past Medical History:  Diagnosis Date   Abdominal pain    in past   Abnormal chest CT    in past   Abnormal EKG    ABNORMAL EKG 05/10/2009   Anxiety    since head injury   Chronic cough    Chronic headache    CKD Stage 2 (GFR 72 ml/min) 04/02/2015   Concussion    past injury from horse   Dyspnea    Elevated  cholesterol, screening 04/16/2016   Elevated testosterone  level 08/05/2019   Exercise-induced asthma    Head injury 04/2013   due to horse injury/ hit backwards by horse head!   History of nephrolithiasis    Hx of migraines    Hypothyroidism    Post-operative nausea and vomiting    Pulmonary nodule 2011   Rosacea    Scapular dyskinesis 07/07/2019   Past Surgical History:  Procedure Laterality Date   CATARACT EXTRACTION Right 10/23/2023   CATARACT EXTRACTION Left 10/30/2023   ELBOW SURGERY Right    2012/ tendon and reattachment left arm in 2018   EYE SURGERY Right    PVD   FOOT SURGERY Left 1991   bunionectomy   KNEE ARTHROSCOPY WITH LATERAL MENISECTOMY Left 09/11/2023   Procedure: KNEE ARTHROSCOPY WITH LATERAL MENISECTOMY;  Surgeon: Cristy Bonner DASEN, MD;  Location: [facility];  Service: Orthopedics;  Laterality: Left;   KNEE RECONSTRUCTION Left 09/11/2023   Procedure: KNEE RECONSTRUCTION MEDIAL COLLATERAL LIGAMENT WITH ACHILLES ALLOGRAFT;  Surgeon: Cristy Bonner DASEN, MD;  Location: [facility];  Service: Orthopedics;  Laterality: Left;   MEDIAL COLLATERAL LIGAMENT REPAIR, KNEE  09/11/2023   Procedure: MEDIAL CAPSULE REPAIR;  Surgeon: Cristy Bonner DASEN, MD;  Location: [facility];  Service: Orthopedics;;   SHOULDER SURGERY Bilateral    right-2003/left 2005 due to bone spurs   Allergies  Allergen Reactions   Accolate  [Zafirlukast ]     GI Upset   Codeine  REACTION: nausea and vomiting   Lyrica [Pregabalin]     Fatigue   Oxycodone     nauseated   Probiotic [Bacid]     Headache   Tape     Causes redness, and skin gets raw!   Methotrexate Other (See Comments)         08/13/2024    8:22 AM 03/12/2024    9:41 AM 08/10/2023   11:57 PM  Depression screen PHQ 2/9  Decreased Interest 0 0 0  Down, Depressed, Hopeless 0 0 0  PHQ - 2 Score 0 0 0  Altered sleeping 0 0   Tired, decreased energy 3 0   Change in appetite 0 0   Feeling bad or  failure about yourself  0 0   Trouble concentrating 2 0   Moving slowly or fidgety/restless 0 0   Suicidal thoughts 0 0   PHQ-9 Score 5 0   Difficult doing work/chores Extremely dIfficult Not difficult at all        08/13/2024    8:23 AM 03/12/2024    9:41 AM  GAD 7 : Generalized Anxiety Score  Nervous, Anxious, on Edge 0 0  Control/stop worrying 0 0  Worry too much - different things 0 0  Trouble relaxing 0 0  Restless 0 0  Easily annoyed or irritable 1 0  Afraid - awful might happen 0 0  Total GAD 7 Score 1 0  Anxiety Difficulty Somewhat difficult Not difficult at all      Review of Systems  Constitutional:  Positive for malaise/fatigue. Negative for chills, fever and weight loss.  HENT:  Negative for congestion, ear discharge, ear pain, hearing loss, nosebleeds, sinus pain, sore throat and tinnitus.   Eyes:  Negative for blurred vision, double vision, pain, discharge and redness.  Respiratory:  Negative for cough, shortness of breath, wheezing and stridor.   Cardiovascular:  Negative for chest pain, palpitations and leg swelling.  Gastrointestinal:  Negative for abdominal pain, constipation, diarrhea, heartburn, nausea and vomiting.  Genitourinary:  Negative for dysuria, frequency and urgency.  Musculoskeletal:  Negative for myalgias.  Skin:  Negative for rash.  Neurological:  Negative for dizziness, tingling, seizures, weakness and headaches.  Psychiatric/Behavioral:  Negative for depression, substance abuse and suicidal ideas. The patient is not nervous/anxious.       Objective:     BP 120/82   Pulse 76   Temp 97.9 F (36.6 C) (Oral)   Ht 5' 8.5 (1.74 m)   Wt 161 lb 12.8 oz (73.4 kg)   SpO2 98%   BMI 24.24 kg/m  BP Readings from Last 3 Encounters:  08/13/24 120/82  04/12/24 132/84  03/12/24 122/70   Wt Readings from Last 3 Encounters:  08/13/24 161 lb 12.8 oz (73.4 kg)  04/12/24 162 lb 6.4 oz (73.7 kg)  03/12/24 162 lb (73.5 kg)      Physical  Exam Vitals and nursing note reviewed.  Constitutional:      Appearance: Normal appearance.  HENT:     Head: Normocephalic and atraumatic.     Right Ear: Tympanic membrane, ear canal and external ear normal.     Left Ear: Tympanic membrane, ear canal and external ear normal.     Nose: Nose normal.     Mouth/Throat:     Mouth: Mucous membranes are moist.     Pharynx: Oropharynx is clear.  Eyes:     Conjunctiva/sclera: Conjunctivae normal.     Pupils: Pupils are equal, round, and reactive  to light.  Cardiovascular:     Rate and Rhythm: Normal rate and regular rhythm.     Pulses: Normal pulses.     Heart sounds: Normal heart sounds.  Pulmonary:     Effort: Pulmonary effort is normal.     Breath sounds: Normal breath sounds.  Abdominal:     General: Abdomen is flat. Bowel sounds are normal.     Palpations: Abdomen is soft.  Musculoskeletal:        General: Normal range of motion.     Cervical back: Normal range of motion.  Skin:    General: Skin is warm and dry.     Capillary Refill: Capillary refill takes less than 2 seconds.  Neurological:     General: No focal deficit present.     Mental Status: She is alert and oriented to person, place, and time. Mental status is at baseline.  Psychiatric:        Mood and Affect: Mood normal.        Behavior: Behavior normal.        Thought Content: Thought content normal.        Judgment: Judgment normal.      No results found for any visits on 08/13/24.     The 10-year ASCVD risk score (Arnett DK, et al., 2019) is: 1.5%    Assessment & Plan:  Encounter for screening and preventative care Assessment & Plan: Immunizations Pneumonia vaccine UTD, TDAP UTD, Influenza UTD Pap smear UTD. Mammogram UTD Colonoscopy UTD,    Discussed the importance of a healthy diet and regular exercise in order for weight loss, and to reduce the risk of further co-morbidity.  Exam stable. Labs pending.  Reviewed labs and notes from rheumatology  and endocrinology.  Follow up in 1 year for repeat physical.   Orders: -     Lipid panel; Future -     Hemoglobin A1c; Future  Vitamin D  deficiency Assessment & Plan: Reviewed labs from 08/03/24- checked at endocrinology and was within normal range.  Controlled.   Hypothyroidism, unspecified type Assessment & Plan: Controlled. Reviewed labs and notes from endocrinology.  Continue TP Thyroid 45 mg once daily.    Screening for HIV (human immunodeficiency virus) -     HIV Antibody (routine testing w rflx); Future  Need for shingles vaccine -     Varicella-zoster vaccine IM  Fatigue, unspecified type Assessment & Plan: Unclear etiology.  Reviewed notes from rheumatology and endocrinology.  Work up pending.  She will keep me updated.      Return in about 1 year (around 08/13/2025) for physical and fasting labs. SABRA Carrol Aurora, NP

## 2024-08-13 NOTE — Assessment & Plan Note (Signed)
 Reviewed labs from 08/03/24- checked at endocrinology and was within normal range.  Controlled.

## 2024-08-13 NOTE — Assessment & Plan Note (Addendum)
 Immunizations Pneumonia vaccine UTD, TDAP UTD, Influenza UTD Pap smear UTD. Mammogram UTD Colonoscopy UTD,    Discussed the importance of a healthy diet and regular exercise in order for weight loss, and to reduce the risk of further co-morbidity.  Exam stable. Labs pending.  Reviewed labs and notes from rheumatology and endocrinology.  Follow up in 1 year for repeat physical.

## 2024-08-16 ENCOUNTER — Other Ambulatory Visit

## 2024-08-16 ENCOUNTER — Other Ambulatory Visit (INDEPENDENT_AMBULATORY_CARE_PROVIDER_SITE_OTHER)

## 2024-08-16 DIAGNOSIS — Z114 Encounter for screening for human immunodeficiency virus [HIV]: Secondary | ICD-10-CM

## 2024-08-16 DIAGNOSIS — Z0000 Encounter for general adult medical examination without abnormal findings: Secondary | ICD-10-CM

## 2024-08-17 ENCOUNTER — Ambulatory Visit: Payer: Self-pay | Admitting: General Practice

## 2024-08-17 LAB — LIPID PANEL
Cholesterol: 207 mg/dL — ABNORMAL HIGH (ref 0–200)
HDL: 74.4 mg/dL (ref >39.00)
LDL Cholesterol: 112 mg/dL — ABNORMAL HIGH (ref 0–99)
NonHDL: 132.84
Total CHOL/HDL Ratio: 3
Triglycerides: 103 mg/dL (ref 0.0–149.0)
VLDL: 20.6 mg/dL (ref 0.0–40.0)

## 2024-08-17 LAB — HEMOGLOBIN A1C: Hgb A1c MFr Bld: 5.6 % (ref 4.6–6.5)

## 2024-08-17 LAB — HIV ANTIBODY (ROUTINE TESTING W REFLEX)
HIV 1&2 Ab, 4th Generation: NONREACTIVE
HIV FINAL INTERPRETATION: NEGATIVE

## 2024-08-23 ENCOUNTER — Encounter: Payer: 59 | Admitting: Internal Medicine

## 2024-10-20 ENCOUNTER — Encounter: Payer: Self-pay | Admitting: General Practice

## 2024-10-20 DIAGNOSIS — S069X0S Unspecified intracranial injury without loss of consciousness, sequela: Secondary | ICD-10-CM

## 2024-10-20 MED ORDER — DULOXETINE HCL 60 MG PO CPEP: 60.0000 mg | ORAL_CAPSULE | Freq: Every day | ORAL | 2 refills | Status: DC

## 2024-11-02 ENCOUNTER — Other Ambulatory Visit: Payer: Self-pay | Admitting: Medical Genetics

## 2024-11-09 ENCOUNTER — Ambulatory Visit (INDEPENDENT_AMBULATORY_CARE_PROVIDER_SITE_OTHER): Payer: Self-pay

## 2024-11-09 DIAGNOSIS — Z23 Encounter for immunization: Secondary | ICD-10-CM | POA: Diagnosis not present

## 2024-11-09 NOTE — Progress Notes (Signed)
 Per orders of Carrol Aurora, DPN AGNP-C, injection of Shingles  given by Nellie Hummer in Left deltoid Patient tolerated injection well. Patient will make appointment for 2-6 month(s)

## 2024-11-15 DIAGNOSIS — S069X0S Unspecified intracranial injury without loss of consciousness, sequela: Secondary | ICD-10-CM

## 2024-12-03 ENCOUNTER — Other Ambulatory Visit: Payer: Self-pay | Admitting: Adult Health

## 2024-12-06 ENCOUNTER — Other Ambulatory Visit: Payer: Self-pay

## 2024-12-21 ENCOUNTER — Other Ambulatory Visit (HOSPITAL_COMMUNITY)

## 2025-08-15 ENCOUNTER — Encounter: Admitting: General Practice
